# Patient Record
Sex: Male | Born: 1950 | Race: White | Hispanic: No | Marital: Married | Smoking: Current every day smoker
Health system: Southern US, Community
[De-identification: ages and names within clinical notes are randomized; demographics above are authoritative.]

## PROBLEM LIST (undated history)

## (undated) DIAGNOSIS — M1990 Unspecified osteoarthritis, unspecified site: Secondary | ICD-10-CM

## (undated) DIAGNOSIS — N433 Hydrocele, unspecified: Secondary | ICD-10-CM

## (undated) DIAGNOSIS — I10 Essential (primary) hypertension: Secondary | ICD-10-CM

## (undated) DIAGNOSIS — Z87820 Personal history of traumatic brain injury: Secondary | ICD-10-CM

## (undated) DIAGNOSIS — J449 Chronic obstructive pulmonary disease, unspecified: Secondary | ICD-10-CM

## (undated) HISTORY — PX: KNEE SURGERY: SHX244

---

## 1983-08-31 HISTORY — PX: FEMUR FRACTURE SURGERY: SHX633

## 2012-08-18 ENCOUNTER — Ambulatory Visit (INDEPENDENT_AMBULATORY_CARE_PROVIDER_SITE_OTHER): Payer: Commercial Managed Care - PPO | Admitting: Sports Medicine

## 2012-08-18 ENCOUNTER — Encounter: Payer: Self-pay | Admitting: Sports Medicine

## 2012-08-18 ENCOUNTER — Ambulatory Visit (HOSPITAL_BASED_OUTPATIENT_CLINIC_OR_DEPARTMENT_OTHER)
Admission: RE | Admit: 2012-08-18 | Discharge: 2012-08-18 | Disposition: A | Discharge status: Home / Self Care | Payer: Commercial Managed Care - PPO | Source: Ambulatory Visit | Attending: Sports Medicine | Admitting: Sports Medicine

## 2012-08-18 VITALS — BP 161/89 | HR 105 | Wt 258.0 lb

## 2012-08-18 DIAGNOSIS — N5089 Other specified disorders of the male genital organs: Secondary | ICD-10-CM

## 2012-08-18 DIAGNOSIS — R319 Hematuria, unspecified: Secondary | ICD-10-CM

## 2012-08-18 DIAGNOSIS — F17200 Nicotine dependence, unspecified, uncomplicated: Secondary | ICD-10-CM | POA: Insufficient documentation

## 2012-08-18 DIAGNOSIS — Z0000 Encounter for general adult medical examination without abnormal findings: Secondary | ICD-10-CM | POA: Insufficient documentation

## 2012-08-18 DIAGNOSIS — Z299 Encounter for prophylactic measures, unspecified: Secondary | ICD-10-CM

## 2012-08-18 DIAGNOSIS — N508 Other specified disorders of male genital organs: Secondary | ICD-10-CM

## 2012-08-18 DIAGNOSIS — Z23 Encounter for immunization: Secondary | ICD-10-CM

## 2012-08-18 DIAGNOSIS — I10 Essential (primary) hypertension: Secondary | ICD-10-CM

## 2012-08-18 DIAGNOSIS — N433 Hydrocele, unspecified: Secondary | ICD-10-CM | POA: Insufficient documentation

## 2012-08-18 LAB — POCT URINALYSIS DIPSTICK
Bilirubin, UA: NEGATIVE
Blood, UA: NEGATIVE
Glucose, UA: NEGATIVE
Ketones, UA: NEGATIVE
Leukocytes, UA: NEGATIVE
Nitrite, UA: NEGATIVE
Protein, UA: NEGATIVE
Spec Grav, UA: >=1.03
Urobilinogen, UA: 0.2
pH, UA: 5.5

## 2012-08-18 MED ORDER — LISINOPRIL-HYDROCHLOROTHIAZIDE 20-25 MG PO TABS: 0.5000 | ORAL_TABLET | Freq: Every day | ORAL | Status: DC

## 2012-08-18 MED ORDER — VARENICLINE TARTRATE 0.5 MG X 11 & 1 MG X 42 PO MISC: ORAL | Status: DC

## 2012-08-18 NOTE — Assessment & Plan Note (Signed)
Add lisinopril/hydrochlorothiazide, one half tab daily. Return in 2 weeks to recheck.
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
## 2012-08-18 NOTE — Assessment & Plan Note (Signed)
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

## 2012-08-18 NOTE — Assessment & Plan Note (Signed)
Needs flu and tetanus shots. We'll go ahead and ask gastroenterology referral for colonoscopy. Checking PSA.

## 2012-08-18 NOTE — Assessment & Plan Note (Addendum)
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 type symptoms. I will send him now for testicular ultrasound. I would also like a urology referral, as well as hCG, AFP, LDH.  Large hydrocele present on ultrasound, we will proceed with urology referral.

## 2012-08-22 LAB — COMPREHENSIVE METABOLIC PANEL
ALT: 24 U/L (ref 0–53)
AST: 17 U/L (ref 0–37)
Albumin: 4.4 g/dL (ref 3.5–5.2)
Alkaline Phosphatase: 67 U/L (ref 39–117)
BUN: 17 mg/dL (ref 6–23)
CO2: 28 mEq/L (ref 19–32)
Calcium: 10.2 mg/dL (ref 8.4–10.5)
Chloride: 97 mEq/L (ref 96–112)
Creat: 1.01 mg/dL (ref 0.50–1.35)
Glucose, Bld: 105 mg/dL — ABNORMAL HIGH (ref 70–99)
Potassium: 5.3 mEq/L (ref 3.5–5.3)
Sodium: 135 mEq/L (ref 135–145)
Total Bilirubin: 0.7 mg/dL (ref 0.3–1.2)
Total Protein: 7.4 g/dL (ref 6.0–8.3)

## 2012-08-22 LAB — PSA, TOTAL AND FREE
PSA, Free Pct: 34 % (ref >25)
PSA, Free: 0.21 ng/mL
PSA: 0.62 ng/mL (ref <=4.00)

## 2012-08-22 LAB — CBC
HCT: 50.8 % (ref 39.0–52.0)
Hemoglobin: 17.7 g/dL — ABNORMAL HIGH (ref 13.0–17.0)
MCH: 31.4 pg (ref 26.0–34.0)
MCHC: 34.8 g/dL (ref 30.0–36.0)
MCV: 90.2 fL (ref 78.0–100.0)
Platelets: 342 10*3/uL (ref 150–400)
RBC: 5.63 MIL/uL (ref 4.22–5.81)
RDW: 13.1 % (ref 11.5–15.5)
WBC: 10 10*3/uL (ref 4.0–10.5)

## 2012-08-22 LAB — HCG, QUANTITATIVE, PREGNANCY: hCG, Beta Chain, Quant, S: <2 m[IU]/mL

## 2012-08-22 LAB — TESTOSTERONE, FREE, TOTAL, SHBG
Sex Hormone Binding: 28 nmol/L (ref 13–71)
Testosterone, Free: 27.4 pg/mL — ABNORMAL LOW (ref 47.0–244.0)
Testosterone-% Free: 2 % (ref 1.6–2.9)
Testosterone: 134.59 ng/dL — ABNORMAL LOW (ref 300–890)

## 2012-08-22 LAB — LIPID PANEL
Cholesterol: 152 mg/dL (ref 0–200)
HDL: 34 mg/dL — ABNORMAL LOW (ref >39)
LDL Cholesterol: 59 mg/dL (ref 0–99)
Total CHOL/HDL Ratio: 4.5 Ratio
Triglycerides: 294 mg/dL — ABNORMAL HIGH (ref <150)
VLDL: 59 mg/dL — ABNORMAL HIGH (ref 0–40)

## 2012-08-22 LAB — TSH: TSH: 3.32 u[IU]/mL (ref 0.350–4.500)

## 2012-08-22 LAB — AFP TUMOR MARKER: AFP-Tumor Marker: 5.3 ng/mL (ref 0.0–8.0)

## 2012-08-22 LAB — LACTATE DEHYDROGENASE: LDH: 127 U/L (ref 94–250)

## 2012-08-24 MED ORDER — FENOFIBRATE 145 MG PO TABS: 145.0000 mg | ORAL_TABLET | Freq: Every day | ORAL | Status: DC

## 2012-08-24 NOTE — Addendum Note (Signed)
Addended by: Monica Becton on: 08/24/2012 09:45 AM   Modules accepted: Orders

## 2012-09-01 ENCOUNTER — Telehealth: Payer: Self-pay | Admitting: Sports Medicine

## 2012-09-01 DIAGNOSIS — N433 Hydrocele, unspecified: Secondary | ICD-10-CM

## 2012-09-01 NOTE — Telephone Encounter (Signed)
Patient walk-in advise that he needs a Urology referral set up. Thanks

## 2012-09-01 NOTE — Telephone Encounter (Signed)
Referral placed, thanks

## 2013-12-03 ENCOUNTER — Ambulatory Visit (INDEPENDENT_AMBULATORY_CARE_PROVIDER_SITE_OTHER): Payer: Commercial Managed Care - PPO | Admitting: Sports Medicine

## 2013-12-03 ENCOUNTER — Encounter: Payer: Self-pay | Admitting: Sports Medicine

## 2013-12-03 ENCOUNTER — Ambulatory Visit (INDEPENDENT_AMBULATORY_CARE_PROVIDER_SITE_OTHER): Payer: Commercial Managed Care - PPO

## 2013-12-03 VITALS — BP 200/173 | HR 77 | Ht 73.0 in | Wt 282.0 lb

## 2013-12-03 DIAGNOSIS — M1611 Unilateral primary osteoarthritis, right hip: Secondary | ICD-10-CM

## 2013-12-03 DIAGNOSIS — N5089 Other specified disorders of the male genital organs: Secondary | ICD-10-CM

## 2013-12-03 DIAGNOSIS — N508 Other specified disorders of male genital organs: Secondary | ICD-10-CM

## 2013-12-03 DIAGNOSIS — I10 Essential (primary) hypertension: Secondary | ICD-10-CM

## 2013-12-03 DIAGNOSIS — M1610 Unilateral primary osteoarthritis, unspecified hip: Secondary | ICD-10-CM

## 2013-12-03 DIAGNOSIS — Z96649 Presence of unspecified artificial hip joint: Secondary | ICD-10-CM | POA: Insufficient documentation

## 2013-12-03 DIAGNOSIS — M169 Osteoarthritis of hip, unspecified: Secondary | ICD-10-CM

## 2013-12-03 MED ORDER — MELOXICAM 15 MG PO TABS: ORAL_TABLET | ORAL | Status: DC

## 2013-12-03 NOTE — Assessment & Plan Note (Signed)
Noncompliant. Extremely elevated, no clinical signs of end organ damage. Is not taking blood pressure medication and had no followed up after previous visit for BP as directed.. Patient understands stroke and MI risk. I have emphasized the importance and would like to see him back in 2 weeks to augment blood pressure medication.

## 2013-12-03 NOTE — Assessment & Plan Note (Signed)
Unfortunately has not yet seen the urologist, referral was placed back in January of 2014. We again discussed the risks, he will make his own decision.

## 2013-12-03 NOTE — Assessment & Plan Note (Signed)
Guided right femoral acetabular joint injection as above. X-rays, formal PT, Mobic. Return to see me in 2 weeks
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
## 2013-12-27 ENCOUNTER — Other Ambulatory Visit: Payer: Self-pay | Admitting: *Deleted

## 2013-12-27 DIAGNOSIS — I10 Essential (primary) hypertension: Secondary | ICD-10-CM

## 2013-12-27 MED ORDER — LISINOPRIL-HYDROCHLOROTHIAZIDE 20-25 MG PO TABS: 0.5000 | ORAL_TABLET | Freq: Every day | ORAL | Status: DC

## 2014-01-31 ENCOUNTER — Ambulatory Visit (INDEPENDENT_AMBULATORY_CARE_PROVIDER_SITE_OTHER): Payer: Commercial Managed Care - PPO | Admitting: Sports Medicine

## 2014-01-31 ENCOUNTER — Encounter: Payer: Self-pay | Admitting: Sports Medicine

## 2014-01-31 VITALS — BP 117/71 | HR 100 | Ht 74.0 in | Wt 275.0 lb

## 2014-01-31 DIAGNOSIS — M169 Osteoarthritis of hip, unspecified: Secondary | ICD-10-CM

## 2014-01-31 DIAGNOSIS — N5089 Other specified disorders of the male genital organs: Secondary | ICD-10-CM

## 2014-01-31 DIAGNOSIS — Z0000 Encounter for general adult medical examination without abnormal findings: Secondary | ICD-10-CM

## 2014-01-31 DIAGNOSIS — M1611 Unilateral primary osteoarthritis, right hip: Secondary | ICD-10-CM

## 2014-01-31 DIAGNOSIS — F17200 Nicotine dependence, unspecified, uncomplicated: Secondary | ICD-10-CM

## 2014-01-31 DIAGNOSIS — Z299 Encounter for prophylactic measures, unspecified: Secondary | ICD-10-CM

## 2014-01-31 DIAGNOSIS — R195 Other fecal abnormalities: Secondary | ICD-10-CM

## 2014-01-31 DIAGNOSIS — N508 Other specified disorders of male genital organs: Secondary | ICD-10-CM

## 2014-01-31 DIAGNOSIS — I10 Essential (primary) hypertension: Secondary | ICD-10-CM

## 2014-01-31 DIAGNOSIS — M1610 Unilateral primary osteoarthritis, unspecified hip: Secondary | ICD-10-CM

## 2014-01-31 DIAGNOSIS — Z1211 Encounter for screening for malignant neoplasm of colon: Secondary | ICD-10-CM

## 2014-01-31 DIAGNOSIS — H6690 Otitis media, unspecified, unspecified ear: Secondary | ICD-10-CM | POA: Insufficient documentation

## 2014-01-31 LAB — HEMOCCULT GUIAC POC 1CARD (OFFICE): Fecal Occult Blood, POC: POSITIVE

## 2014-01-31 MED ORDER — TRAMADOL HCL 50 MG PO TABS: ORAL_TABLET | ORAL | Status: DC

## 2014-01-31 MED ORDER — LISINOPRIL-HYDROCHLOROTHIAZIDE 20-25 MG PO TABS: 0.5000 | ORAL_TABLET | Freq: Every day | ORAL | Status: DC

## 2014-01-31 MED ORDER — AMOXICILLIN-POT CLAVULANATE 875-125 MG PO TABS: 1.0000 | ORAL_TABLET | Freq: Two times a day (BID) | ORAL | Status: DC

## 2014-01-31 NOTE — Progress Notes (Signed)
  Subjective:    CC: Complete physical exam  HPI:  Hypertension: Well controlled now.  Testicular mass: Ultrasound showed hydrocele with internal echogenicity. We have recommended urology referral multiple times and he is continued to do for this, we have discussed the risks.  Right hip osteoarthritis: Persistent pain, injection lasted for 2 months, it was recommended that he pursue hip replacement however he would like to try experimental autologous stem implantation first.  Ear pain: Bilateral, pain in both ears, no change in hearing.  Preventive measure: Due for colonoscopy, we have also been recommending this since he started to see me, he has continued to do for her and cancel visits with gastroenterology.  Past medical history, Surgical history, Family history not pertinant except as noted below, Social history, Allergies, and medications have been entered into the medical record, reviewed, and no changes needed.   Review of Systems: No headache, visual changes, nausea, vomiting, diarrhea, constipation, dizziness, abdominal pain, skin rash, fevers, chills, night sweats, swollen lymph nodes, weight loss, chest pain, body aches, joint swelling, muscle aches, shortness of breath, mood changes, visual or auditory hallucinations.  Objective:    General: Well Developed, well nourished, and in no acute distress.  Neuro: Alert and oriented x3, extra-ocular muscles intact, sensation grossly intact.  HEENT: Normocephalic, atraumatic, pupils equal round reactive to light, neck supple, no masses, no lymphadenopathy, thyroid nonpalpable. Erythema of both tympanic membranes Skin: Warm and dry, no rashes noted.  Cardiac: Regular rate and rhythm, no murmurs rubs or gallops.  Respiratory: Clear to auscultation bilaterally. Not using accessory muscles, speaking in full sentences.  Abdominal: Soft, nontender, nondistended, positive bowel sounds, no masses, no organomegaly.  Musculoskeletal: Shoulder,  elbow, wrist, hip, knee, ankle stable, and with full range of motion. Rectal: Smooth prostate, good tone, Hemoccult positive.  Impression and Recommendations:    The patient was counselled, risk factors were discussed, anticipatory guidance given.

## 2014-01-31 NOTE — Assessment & Plan Note (Signed)
Again, referral colonoscopy, this time he has a Hemoccult positive stool. Physical exam performed.

## 2014-01-31 NOTE — Assessment & Plan Note (Signed)
Again, encouraged to see the urologist regarding testicular mass.

## 2014-01-31 NOTE — Assessment & Plan Note (Signed)
Again, counseled to quit.

## 2014-01-31 NOTE — Assessment & Plan Note (Signed)
Again, I have stressed the importance for over a year with screening colonoscopy. Now he has a positive Hemoccult, again, placing referral for colonoscopy.

## 2014-01-31 NOTE — Assessment & Plan Note (Signed)
Well-controlled, refilling blood pressure medication.

## 2014-01-31 NOTE — Assessment & Plan Note (Signed)
Augmentin

## 2014-01-31 NOTE — Assessment & Plan Note (Signed)
2 months response to intra-articular injection. We did discuss the likely need for placement. He would like to try stem cells first. He will start looking for a provider who can do autologous stem cell injection.

## 2014-07-18 ENCOUNTER — Encounter: Payer: Self-pay | Admitting: Sports Medicine

## 2014-07-18 ENCOUNTER — Ambulatory Visit (INDEPENDENT_AMBULATORY_CARE_PROVIDER_SITE_OTHER): Payer: Commercial Managed Care - PPO | Admitting: Sports Medicine

## 2014-07-18 VITALS — BP 146/85 | HR 80 | Ht 75.0 in | Wt 277.0 lb

## 2014-07-18 DIAGNOSIS — M1611 Unilateral primary osteoarthritis, right hip: Secondary | ICD-10-CM

## 2014-07-18 DIAGNOSIS — I10 Essential (primary) hypertension: Secondary | ICD-10-CM

## 2014-07-18 DIAGNOSIS — N5089 Other specified disorders of the male genital organs: Secondary | ICD-10-CM

## 2014-07-18 DIAGNOSIS — Z23 Encounter for immunization: Secondary | ICD-10-CM

## 2014-07-18 DIAGNOSIS — R195 Other fecal abnormalities: Secondary | ICD-10-CM

## 2014-07-18 MED ORDER — LISINOPRIL-HYDROCHLOROTHIAZIDE 20-25 MG PO TABS: 0.5000 | ORAL_TABLET | Freq: Every day | ORAL | Status: DC

## 2014-07-18 NOTE — Assessment & Plan Note (Signed)
Still has not yet seen urology, counseled on the risks of an untreated/undiagnosed testicular mass. He tells me he has not had time. Again counselled of the risks and possible diagnoses including cancer.

## 2014-07-18 NOTE — Assessment & Plan Note (Signed)
Refilling blood pressure medication. 

## 2014-07-18 NOTE — Assessment & Plan Note (Signed)
Right femoral acetabular steroid injection as above. If this is ineffective, patient is ready to try autologous stem cell implantation. Return in one month.

## 2014-07-18 NOTE — Assessment & Plan Note (Addendum)
Again, has not yet seen GI regarding fecal occult blood. Again counseled on the risks, benefits. Again counselled of the risks and possible diagnoses including cancer.

## 2014-07-18 NOTE — Progress Notes (Signed)
  Subjective:    CC: Follow-up  HPI: Right hip osteoarthritis: Desires injection, pain is in the groin, moderate, persistent without radiation. Last injection was 6-7 months ago.  Occult blood positive stool: Has not yet obtained his colonoscopy or seen a gastroenterologist as directed.  Testicular mass: Again, has not yet seen a urologist as directed.  Past medical history, Surgical history, Family history not pertinant except as noted below, Social history, Allergies, and medications have been entered into the medical record, reviewed, and no changes needed.   Review of Systems: No fevers, chills, night sweats, weight loss, chest pain, or shortness of breath.   Objective:    General: Well Developed, well nourished, and in no acute distress.  Neuro: Alert and oriented x3, extra-ocular muscles intact, sensation grossly intact.  HEENT: Normocephalic, atraumatic, pupils equal round reactive to light, neck supple, no masses, no lymphadenopathy, thyroid nonpalpable.  Skin: Warm and dry, no rashes. Cardiac: Regular rate and rhythm, no murmurs rubs or gallops, no lower extremity edema.  Respiratory: Clear to auscultation bilaterally. Not using accessory muscles, speaking in full sentences.  Procedure: Real-time Ultrasound Guided Injection of right femoral acetabular joint Device: GE Logiq E  Verbal informed consent obtained.  Time-out conducted.  Noted no overlying erythema, induration, or other signs of local infection.  Skin prepped in a sterile fashion.  Local anesthesia: Topical Ethyl chloride.  With sterile technique and under real time ultrasound guidance:  Spinal needle advanced in the femoral head/neck junction, 2 mL kenalog 40, 4 mL lidocaine injected easily. Completed without difficulty  Pain immediately resolved suggesting accurate placement of the medication.  Advised to call if fevers/chills, erythema, induration, drainage, or persistent bleeding.  Images permanently stored  and available for review in the ultrasound unit.  Impression: Technically successful ultrasound guided injection.  Impression and Recommendations:

## 2014-09-11 ENCOUNTER — Telehealth: Payer: Self-pay | Admitting: Sports Medicine

## 2014-09-11 MED ORDER — TRAMADOL HCL 50 MG PO TABS: ORAL_TABLET | ORAL | Status: DC

## 2014-09-11 NOTE — Telephone Encounter (Signed)
Rx has been faxed to [facility]. Ryosuke Ericksen,CMA  

## 2014-09-11 NOTE — Telephone Encounter (Signed)
Patient would like refill for tramadol sent to [facility] here in [place].

## 2014-09-11 NOTE — Telephone Encounter (Signed)
rx in box 

## 2014-10-30 ENCOUNTER — Telehealth: Payer: Self-pay

## 2014-10-30 NOTE — Telephone Encounter (Signed)
Patient request refill for Tramadol. Goble Fudala,CMA  

## 2014-10-31 MED ORDER — TRAMADOL HCL 50 MG PO TABS: ORAL_TABLET | ORAL | Status: DC

## 2014-10-31 NOTE — Telephone Encounter (Signed)
rx in box 

## 2014-10-31 NOTE — Telephone Encounter (Signed)
Rx Tramadol has been faxed to [facility]. Johna Kearl,CMA  

## 2014-11-01 ENCOUNTER — Other Ambulatory Visit: Payer: Self-pay | Admitting: Sports Medicine

## 2014-11-15 ENCOUNTER — Ambulatory Visit (INDEPENDENT_AMBULATORY_CARE_PROVIDER_SITE_OTHER): Payer: Commercial Managed Care - PPO | Admitting: Sports Medicine

## 2014-11-15 ENCOUNTER — Encounter: Payer: Self-pay | Admitting: Sports Medicine

## 2014-11-15 VITALS — BP 147/90 | HR 69 | Ht 74.0 in | Wt 267.0 lb

## 2014-11-15 DIAGNOSIS — I10 Essential (primary) hypertension: Secondary | ICD-10-CM

## 2014-11-15 DIAGNOSIS — M1611 Unilateral primary osteoarthritis, right hip: Secondary | ICD-10-CM | POA: Diagnosis not present

## 2014-11-15 MED ORDER — LISINOPRIL-HYDROCHLOROTHIAZIDE 20-25 MG PO TABS: 0.5000 | ORAL_TABLET | Freq: Every day | ORAL | Status: DC

## 2014-11-15 NOTE — Assessment & Plan Note (Signed)
Has not taken his blood pressure medicine.

## 2014-11-15 NOTE — Assessment & Plan Note (Signed)
Recent injection was 4 months ago. Repeat right femoral acetabular joint injection as above. Referral to orthopedic surgery for further discussion of total arthroplasty.

## 2014-11-15 NOTE — Progress Notes (Signed)
  Subjective:    CC: Hip pain  HPI: Christopher Robertson has known right femoral acetabular osteoarthritis, he does well with occasional hip joint injections under ultrasound guidance, the most recent injection was 4 months ago. Pain is moderate, persistent and localized in the groin. He has not yet seen orthopedic surgery for discussion of arthroplasty.  Past medical history, Surgical history, Family history not pertinant except as noted below, Social history, Allergies, and medications have been entered into the medical record, reviewed, and no changes needed.   Review of Systems: No fevers, chills, night sweats, weight loss, chest pain, or shortness of breath.   Objective:    General: Well Developed, well nourished, and in no acute distress.  Neuro: Alert and oriented x3, extra-ocular muscles intact, sensation grossly intact.  HEENT: Normocephalic, atraumatic, pupils equal round reactive to light, neck supple, no masses, no lymphadenopathy, thyroid nonpalpable.  Skin: Warm and dry, no rashes. Cardiac: Regular rate and rhythm, no murmurs rubs or gallops, no lower extremity edema.  Respiratory: Clear to auscultation bilaterally. Not using accessory muscles, speaking in full sentences.  Procedure: Real-time Ultrasound Guided Injection of right femoral acetabular joint Device: GE Logiq E  Verbal informed consent obtained.  Time-out conducted.  Noted no overlying erythema, induration, or other signs of local infection.  Skin prepped in a sterile fashion.  Local anesthesia: Topical Ethyl chloride.  With sterile technique and under real time ultrasound guidance:  Spinal needle advanced to the femoral head/neck junction, 2 mL kenalog 40, 4 mL lidocaine injected easily. Completed without difficulty  Pain immediately resolved suggesting accurate placement of the medication.  Advised to call if fevers/chills, erythema, induration, drainage, or persistent bleeding.  Images permanently stored and available  for review in the ultrasound unit.  Impression: Technically successful ultrasound guided injection.  Impression and Recommendations:

## 2014-12-06 ENCOUNTER — Telehealth: Payer: Self-pay | Admitting: Sports Medicine

## 2014-12-06 MED ORDER — TRAMADOL HCL 50 MG PO TABS: ORAL_TABLET | ORAL | Status: DC

## 2014-12-06 NOTE — Telephone Encounter (Signed)
Pt came by. He wants refill on Tramadol/[facility] [address].  Thank you.

## 2014-12-06 NOTE — Telephone Encounter (Signed)
Rx in box. 

## 2014-12-06 NOTE — Telephone Encounter (Signed)
Dr. T please see note below. Shauntelle Jamerson,CMA  

## 2014-12-09 NOTE — Telephone Encounter (Signed)
[facility] Donaven Criswell,CMA

## 2015-01-06 ENCOUNTER — Ambulatory Visit: Payer: Commercial Managed Care - PPO | Admitting: Sports Medicine

## 2015-01-16 ENCOUNTER — Ambulatory Visit (INDEPENDENT_AMBULATORY_CARE_PROVIDER_SITE_OTHER): Payer: Commercial Managed Care - PPO

## 2015-01-16 ENCOUNTER — Ambulatory Visit (INDEPENDENT_AMBULATORY_CARE_PROVIDER_SITE_OTHER): Payer: Commercial Managed Care - PPO | Admitting: Sports Medicine

## 2015-01-16 ENCOUNTER — Encounter: Payer: Self-pay | Admitting: Sports Medicine

## 2015-01-16 VITALS — BP 165/89 | HR 89 | Wt 261.0 lb

## 2015-01-16 DIAGNOSIS — R0602 Shortness of breath: Secondary | ICD-10-CM | POA: Diagnosis not present

## 2015-01-16 DIAGNOSIS — M1611 Unilateral primary osteoarthritis, right hip: Secondary | ICD-10-CM

## 2015-01-16 DIAGNOSIS — Z01818 Encounter for other preprocedural examination: Secondary | ICD-10-CM

## 2015-01-16 DIAGNOSIS — I10 Essential (primary) hypertension: Secondary | ICD-10-CM

## 2015-01-16 DIAGNOSIS — J449 Chronic obstructive pulmonary disease, unspecified: Secondary | ICD-10-CM | POA: Insufficient documentation

## 2015-01-16 MED ORDER — HYDROCODONE-ACETAMINOPHEN 5-325 MG PO TABS: 1.0000 | ORAL_TABLET | Freq: Two times a day (BID) | ORAL | Status: DC

## 2015-01-16 MED ORDER — AMLODIPINE BESYLATE 10 MG PO TABS: 10.0000 mg | ORAL_TABLET | Freq: Every day | ORAL | Status: DC

## 2015-01-16 NOTE — Progress Notes (Addendum)
  Subjective:    CC: Surgical clearance  HPI: Christopher Robertson returns, he is going to be set up for a hip replacement, he needs surgical clearance. He has smoked for decades, he does get occasional shortness of breath. He is able to walk up 2 flights of steps or a couple of blocks without any chest pain, diaphoresis, nausea, indicating at least 4 METs of exercise tolerance. His blood pressure is elevated today, no headaches, visual changes, chest pain.  Past medical history, Surgical history, Family history not pertinant except as noted below, Social history, Allergies, and medications have been entered into the medical record, reviewed, and no changes needed.   Review of Systems: No fevers, chills, night sweats, weight loss, chest pain, or shortness of breath.   Objective:    General: Well Developed, well nourished, and in no acute distress.  Neuro: Alert and oriented x3, extra-ocular muscles intact, sensation grossly intact.  HEENT: Normocephalic, atraumatic, pupils equal round reactive to light, neck supple, no masses, no lymphadenopathy, thyroid nonpalpable.  Skin: Warm and dry, no rashes. Cardiac: Regular rate and rhythm, no murmurs rubs or gallops, no lower extremity edema.  Respiratory: Clear to auscultation bilaterally. Not using accessory muscles, speaking in full sentences.  12-lead EKG shows normal sinus rhythm with normal axis, normal rate at 72, no PR, or ST changes.  Impression and Recommendations:    I spent 40 minutes with this patient, 50% was face-to-face time counseling regarding the above diagnoses

## 2015-01-16 NOTE — Assessment & Plan Note (Signed)
ECG, chest x-ray, appropriate cardiovascular endurance for intermediate risk noncardiac surgery. We do need to get his blood pressure under control and further evaluate his shortness of breath.

## 2015-01-16 NOTE — Assessment & Plan Note (Signed)
Long time smoker, patient will return for pre and post BD spirometry. CXR today.

## 2015-01-16 NOTE — Assessment & Plan Note (Addendum)
At this point we are going to start surgical clearance, [place]. Adding hydrocodone, some of the elevated blood pressure may be due to pain. #60 pills for 3 months.

## 2015-01-16 NOTE — Assessment & Plan Note (Signed)
1 full tab a lisinopril/hydrochlorothiazide and adding amlodipine. Return in one week on the nurse schedule to recheck blood pressure.

## 2015-01-17 ENCOUNTER — Ambulatory Visit: Payer: Commercial Managed Care - PPO | Admitting: Sports Medicine

## 2015-01-28 ENCOUNTER — Encounter: Payer: Self-pay | Admitting: Sports Medicine

## 2015-01-28 ENCOUNTER — Ambulatory Visit (INDEPENDENT_AMBULATORY_CARE_PROVIDER_SITE_OTHER): Payer: Commercial Managed Care - PPO | Admitting: Sports Medicine

## 2015-01-28 VITALS — BP 116/77 | HR 78 | Ht 74.0 in | Wt 262.0 lb

## 2015-01-28 DIAGNOSIS — M1611 Unilateral primary osteoarthritis, right hip: Secondary | ICD-10-CM

## 2015-01-28 DIAGNOSIS — Z01818 Encounter for other preprocedural examination: Secondary | ICD-10-CM | POA: Diagnosis not present

## 2015-01-28 DIAGNOSIS — I10 Essential (primary) hypertension: Secondary | ICD-10-CM | POA: Diagnosis not present

## 2015-01-28 MED ORDER — AMLODIPINE BESYLATE 10 MG PO TABS: 10.0000 mg | ORAL_TABLET | Freq: Every day | ORAL | Status: DC

## 2015-01-28 MED ORDER — LISINOPRIL-HYDROCHLOROTHIAZIDE 20-25 MG PO TABS: 1.0000 | ORAL_TABLET | Freq: Every day | ORAL | Status: DC

## 2015-01-28 MED ORDER — HYDROCODONE-ACETAMINOPHEN 5-325 MG PO TABS: 1.0000 | ORAL_TABLET | Freq: Two times a day (BID) | ORAL | Status: DC

## 2015-01-28 MED ORDER — TRAMADOL HCL 50 MG PO TABS: 50.0000 mg | ORAL_TABLET | Freq: Two times a day (BID) | ORAL | Status: DC

## 2015-01-28 NOTE — Assessment & Plan Note (Signed)
Well-controlled on lisinopril/hydrochlorothiazide and amlodipine. Refilling medications.

## 2015-01-28 NOTE — Assessment & Plan Note (Signed)
Refilling medication. Patient tells me it works well when used with tramadol. Ultimately his pain will not be controlled until he has a replacement.

## 2015-01-28 NOTE — Progress Notes (Signed)
  Subjective:    CC: follow-up  HPI: Right hip osteoarthritis: End-stage, awaiting total hip arthroplasty, tells me pain does well with hydrocodone mixed with tramadol, I am amenable to do this briefly.  Hypertension: Stabilized on lisinopril/hydrochlorothiazide as well as amlodipine at maximal doses.  Preop clearance: Blood pressure is now controlled with current medications, he has quit smoking, he is still due for a pre-and postbronchodilator spirometry test.  Past medical history, Surgical history, Family history not pertinant except as noted below, Social history, Allergies, and medications have been entered into the medical record, reviewed, and no changes needed.   Review of Systems: No fevers, chills, night sweats, weight loss, chest pain, or shortness of breath.   Objective:    General: Well Developed, well nourished, and in no acute distress.  Neuro: Alert and oriented x3, extra-ocular muscles intact, sensation grossly intact.  HEENT: Normocephalic, atraumatic, pupils equal round reactive to light, neck supple, no masses, no lymphadenopathy, thyroid nonpalpable.  Skin: Warm and dry, no rashes. Cardiac: Regular rate and rhythm, no murmurs rubs or gallops, no lower extremity edema.  Respiratory: Clear to auscultation bilaterally. Not using accessory muscles, speaking in full sentences.  Impression and Recommendations:

## 2015-01-28 NOTE — Assessment & Plan Note (Signed)
Blood pressure is now under control. Patient will return for pre-and postbronchodilator spirometry. Handicap placard written for use until surgery. Repeat work note.

## 2015-01-29 ENCOUNTER — Encounter: Payer: Self-pay | Admitting: Sports Medicine

## 2015-01-29 ENCOUNTER — Ambulatory Visit (INDEPENDENT_AMBULATORY_CARE_PROVIDER_SITE_OTHER): Payer: Commercial Managed Care - PPO | Admitting: Sports Medicine

## 2015-01-29 VITALS — BP 114/74 | HR 87 | Wt 261.0 lb

## 2015-01-29 DIAGNOSIS — J449 Chronic obstructive pulmonary disease, unspecified: Secondary | ICD-10-CM | POA: Diagnosis not present

## 2015-01-29 DIAGNOSIS — R0602 Shortness of breath: Secondary | ICD-10-CM

## 2015-01-29 MED ORDER — FLUTICASONE FUROATE-VILANTEROL 100-25 MCG/INH IN AEPB: 1.0000 | INHALATION_SPRAY | Freq: Every day | RESPIRATORY_TRACT | Status: DC

## 2015-01-29 MED ORDER — ALBUTEROL SULFATE (2.5 MG/3ML) 0.083% IN NEBU
2.5000 mg | INHALATION_SOLUTION | Freq: Once | RESPIRATORY_TRACT | Status: AC
Start: 2015-01-29 — End: 2015-01-29
  Administered 2015-01-29: 2.5 mg via RESPIRATORY_TRACT

## 2015-01-29 NOTE — Assessment & Plan Note (Signed)
Moderate to severe COPD. [facility] given to make [facility]

## 2015-01-29 NOTE — Addendum Note (Signed)
Addended by: Collie SiadICHARDSON, Robet Crutchfield M on: 01/29/2015 10:03 AM   Modules accepted: Orders

## 2015-01-29 NOTE — Progress Notes (Signed)
    Patient ID: Christopher Robertson, male   DOB: October 16, 1950, 64 y.o.   MRN: [id number] Patient came into clinic today for nurse visit, spirometry. Patient preformed pre spirometry, received a nebulizer albuterol treatment, then preformed post spirometry. Results were given to Dr. Benjamin Stainhekkekandam for review. Per Physician recommendation, Pt was given an Rx for an inhaler as well as a coupon card for 1 year free. No further questions.    Pre-and postbronchodilator spirometry, prolonged evaluation of bronchospasm was performed today. Results showed moderate to severe obstructive lung disease with no real improvement after bronchodilator.   I spent an additional amount of time discussing the results and the pathophysiology with the patient, a total of 40 minutes was spent with this patient and 50% was face-to-face time counseling regarding the above diagnoses.

## 2015-01-30 ENCOUNTER — Ambulatory Visit: Payer: Commercial Managed Care - PPO | Admitting: Sports Medicine

## 2015-02-03 ENCOUNTER — Encounter: Payer: Self-pay | Admitting: Sports Medicine

## 2015-02-03 ENCOUNTER — Ambulatory Visit (INDEPENDENT_AMBULATORY_CARE_PROVIDER_SITE_OTHER): Payer: Commercial Managed Care - PPO | Admitting: Sports Medicine

## 2015-02-03 VITALS — BP 136/78 | HR 87 | Ht 76.0 in | Wt 263.0 lb

## 2015-02-03 DIAGNOSIS — M1611 Unilateral primary osteoarthritis, right hip: Secondary | ICD-10-CM | POA: Diagnosis not present

## 2015-02-03 NOTE — Progress Notes (Signed)
  Subjective:    CC: FMLA paperwork.  HPI: Brayton MarsDarwin returns, he has hip osteoarthritis and is scheduled for an arthroplasty, needs short-term disability and FMLA paperwork filled out today.  Past medical history, Surgical history, Family history not pertinant except as noted below, Social history, Allergies, and medications have been entered into the medical record, reviewed, and no changes needed.   Review of Systems: No fevers, chills, night sweats, weight loss, chest pain, or shortness of breath.   Objective:    General: Well Developed, well nourished, and in no acute distress.  Neuro: Alert and oriented x3, extra-ocular muscles intact, sensation grossly intact.  HEENT: Normocephalic, atraumatic, pupils equal round reactive to light, neck supple, no masses, no lymphadenopathy, thyroid nonpalpable.  Skin: Warm and dry, no rashes. Cardiac: Regular rate and rhythm, no murmurs rubs or gallops, no lower extremity edema.  Respiratory: Clear to auscultation bilaterally. Not using accessory muscles, speaking in full sentences.  Impression and Recommendations:    I spent 25 minutes with this patient, 50% was face-to-face time counseling regarding the above diagnosis.

## 2015-02-03 NOTE — Assessment & Plan Note (Signed)
FMLA paperwork and short-term disability forms filled out today. I also printed him out copies of his medical records.

## 2015-02-24 ENCOUNTER — Other Ambulatory Visit: Payer: Self-pay | Admitting: *Deleted

## 2015-02-24 DIAGNOSIS — M1611 Unilateral primary osteoarthritis, right hip: Secondary | ICD-10-CM

## 2015-02-24 MED ORDER — TRAMADOL HCL 50 MG PO TABS: 50.0000 mg | ORAL_TABLET | Freq: Two times a day (BID) | ORAL | Status: DC

## 2015-02-28 HISTORY — PX: TOTAL HIP ARTHROPLASTY: SHX124

## 2015-03-10 ENCOUNTER — Encounter: Payer: Self-pay | Admitting: *Deleted

## 2015-03-10 ENCOUNTER — Emergency Department (INDEPENDENT_AMBULATORY_CARE_PROVIDER_SITE_OTHER)
Admission: EM | Admit: 2015-03-10 | Discharge: 2015-03-10 | Disposition: A | Discharge status: Home / Self Care | Payer: Commercial Managed Care - PPO | Source: Home / Self Care | Attending: Family Medicine | Admitting: Family Medicine

## 2015-03-10 DIAGNOSIS — G8918 Other acute postprocedural pain: Secondary | ICD-10-CM

## 2015-03-10 DIAGNOSIS — Z760 Encounter for issue of repeat prescription: Secondary | ICD-10-CM

## 2015-03-10 DIAGNOSIS — R600 Localized edema: Secondary | ICD-10-CM

## 2015-03-10 HISTORY — DX: Essential (primary) hypertension: I10

## 2015-03-10 MED ORDER — FUROSEMIDE 20 MG PO TABS: 20.0000 mg | ORAL_TABLET | Freq: Every day | ORAL | Status: DC

## 2015-03-10 MED ORDER — OXYCODONE-ACETAMINOPHEN 7.5-325 MG PO TABS: 1.0000 | ORAL_TABLET | Freq: Four times a day (QID) | ORAL | Status: DC | PRN

## 2015-03-10 MED ORDER — METHOCARBAMOL 500 MG PO TABS: 500.0000 mg | ORAL_TABLET | Freq: Two times a day (BID) | ORAL | Status: DC

## 2015-03-10 MED ORDER — GABAPENTIN 100 MG PO CAPS: 100.0000 mg | ORAL_CAPSULE | Freq: Three times a day (TID) | ORAL | Status: DC

## 2015-03-10 NOTE — ED Provider Notes (Signed)
CSN: [id number]     Arrival date & time 03/10/15  1533 History   First MD Initiated Contact with Patient 03/10/15 1610     Chief Complaint  Patient presents with  . Hip Pain  . Post-op Problem   (Consider location/radiation/quality/duration/timing/severity/associated sxs/prior Treatment) HPI  Patient is a 64 year old male presenting to urgent care with complaint of right hip pain 1 week postop Right hip replacement that was performed in [facility].  Pt was discharged last week, followed up with his surgeon on Thursday, July 7th. At that time, he had multiple plain films that ensured everything was in place and healing well. Pt came to [facility] requesting refill on his percocet as he was unable to f/u with PCP, Dr. Denyse Amass, until July 13th.  He describes Right left pain worse in the thigh than the hip. Pain is moderate to severe, aching, sore and occasional burning, fire sensation.  Pt states pain is worse with ambulation. He has tried flexeril as well but no relief. Pt notes significant edema to both legs since home health nurse provided PT on Friday and Saturday.  Denies CP or SOB. Denies hx of CHF. Pt states he was given compression stockings but is unsure how to put them on and was not sure if he needed to be using them. Denies fever, chills, n/v/d. Denies redness or warmth near surgical site.  Past Medical History  Diagnosis Date  . Hypertension    Past Surgical History  Procedure Laterality Date  . Knee surgery  1980 &2002  . Femur fracture surgery  1985  . Total hip arthroplasty Right    History reviewed. No pertinent family history. History  Substance Use Topics  . Smoking status: Current Every Day Smoker  . Smokeless tobacco: Not on file  . Alcohol Use: No    Review of Systems  Constitutional: Negative for fever and chills.  Respiratory: Negative for cough and shortness of breath.   Cardiovascular: Positive for leg swelling ( bilateral). Negative for chest  pain and palpitations.  Gastrointestinal: Negative for nausea, vomiting, abdominal pain and diarrhea.  Genitourinary: Negative for dysuria, urgency, hematuria and flank pain.  Musculoskeletal: Positive for myalgias, joint swelling and arthralgias.  Skin: Positive for wound. Negative for color change.  Neurological: Positive for numbness ( "electric shocks" in Right thigh). Negative for weakness.    Allergies  Review of patient's allergies indicates no known allergies.  Home Medications   Prior to Admission medications   Medication Sig Start Date End Date Taking? Authorizing Provider  amLODipine (NORVASC) 10 MG tablet Take 1 tablet (10 mg total) by mouth daily. 01/28/15   Monica Becton, MD  Fluticasone Furoate-Vilanterol (BREO ELLIPTA) 100-25 MCG/INH AEPB Inhale 1 puff into the lungs daily. 01/29/15   Monica Becton, MD  furosemide (LASIX) 20 MG tablet Take 1 tablet (20 mg total) by mouth daily. 03/10/15   Junius Finner, PA-C  gabapentin (NEURONTIN) 100 MG capsule Take 1 capsule (100 mg total) by mouth 3 (three) times daily. 03/10/15   Junius Finner, PA-C  lisinopril-hydrochlorothiazide (PRINZIDE,ZESTORETIC) 20-25 MG per tablet Take 1 tablet by mouth daily. 01/28/15   Monica Becton, MD  methocarbamol (ROBAXIN) 500 MG tablet Take 1 tablet (500 mg total) by mouth 2 (two) times daily. 03/10/15   Junius Finner, PA-C  oxyCODONE-acetaminophen (PERCOCET) 7.5-325 MG per tablet Take 1 tablet by mouth every 6 (six) hours as needed for severe pain. 03/10/15   Junius Finner, PA-C  traMADol Janean Sark) 50  MG tablet Take 1 tablet (50 mg total) by mouth 2 (two) times daily. 02/24/15   Monica Bectonhomas J Thekkekandam, MD   BP 105/68 mmHg  Pulse 107  Resp 16  SpO2 96% Physical Exam  Constitutional: He appears well-developed and well-nourished.  Pt sitting in wheelchair, NAD.  HENT:  Head: Normocephalic and atraumatic.  Eyes: Conjunctivae are normal. No scleral icterus.  Neck: Normal range of motion.  Neck supple.  Cardiovascular: Normal rate, regular rhythm and normal heart sounds.   Pulses:      Dorsalis pedis pulses are 2+ on the right side, and 2+ on the left side.  Tachycardic in triage, however, regular rate on exam.  Pulmonary/Chest: Effort normal and breath sounds normal. No respiratory distress. He has no wheezes. He has no rales. He exhibits no tenderness.  Lungs: CTAB, no rales or rhonchi  Abdominal: Soft. Bowel sounds are normal. He exhibits no distension and no mass. There is no tenderness. There is no rebound and no guarding.  Musculoskeletal: He exhibits edema and tenderness.  Right hip and thigh: bandage in place over Right hip, well healed surgical scar on distal lateral Right thigh. Mild tenderness to Right thigh. No calf tenderness. FROM Right knee w/o tenderness.  Bilateral 2 to 3+ pedal edema from toes to tibial tuberosities. Right > Left Muscle compartments are soft.  Neurological: He is alert.  Antalgic gait  Skin: Skin is warm and dry. No erythema.  Right hip: clean dry bandage in place over incision site. No surrounding erythema or tenderness.  Nursing note and vitals reviewed.   ED Course  Procedures (including critical care time) Labs Review Labs Reviewed  COMPLETE METABOLIC PANEL WITH GFR  CBC WITH DIFFERENTIAL/PLATELET    Imaging Review No results found.   MDM   1. Post-op pain   2. Bilateral leg edema   3. Medication refill     Patient is a 64 year old male presenting to urgent care for medication refill.  1 week postop right hip replacement.  Patient has a clean dry dressing over surgical site.  No surrounding erythema.  Patient is afebrile.  Doubt infection including cellulitis or septic joint. Patient does have 2-3+ pitting edema bilaterally. Compartments are soft.  No history of CHF.  Denies chest pain or difficulty breathing.  Lungs clear to auscultation bilaterally. Doubt PE or compartment syndrome.  Discussed patient with Dr. Denyse Amassorey, who  agrees.  We may start patient on low-dose Lasix and get baseline labs. Will refill patient's Percocet as well as prescribe robaxin and Neurontin to help with nerve type pain. Strongly encourage patient to attempt to use compression stockings at home, however, advise if he is unable to use them, please bring them to his next appointment on Wednesday. Encourage patient to keep legs elevated. Patient verbalized understanding and agreement with treatment plan.   Junius Finnerrin O'Malley, PA-C 03/10/15 [phone]

## 2015-03-10 NOTE — Discharge Instructions (Signed)
Be sure to take your medication as prescribed. Percocet is a narcotic pain medication, do not combine these medications with others containing tylenol. While taking, do not drink alcohol, drive, or perform any other activities that requires focus while taking these medications. Robaxin (muscle relaxer) may also cause drowsiness, do not drive or operate heavy machinery while taking.  If you still have your compression stockings at home, please try to wear them to help with edema. You may find it easier to try after keeping you legs elevated for 20-30 minutes.  Please bring to your follow up appointment with Dr. Denyse Amassorey if you have difficulty putting them on.  See below for further instructions.

## 2015-03-10 NOTE — ED Notes (Signed)
Pt had a total right hip [facility] >1 week ago. The surgery was done there due to insurance reasons. He was hoping to be able to f/u with Dr. Benjamin Stainhekkekandam today but is unable to see Dr. Denyse Amassorey until Wednesday and Dr. Karie Schwalbe the following week. He is out of pain medication and has lower extremity pitting edema.

## 2015-03-11 LAB — COMPLETE METABOLIC PANEL WITH GFR
ALT: 16 U/L (ref 0–53)
AST: 15 U/L (ref 0–37)
Albumin: 3.4 g/dL — ABNORMAL LOW (ref 3.5–5.2)
Alkaline Phosphatase: 63 U/L (ref 39–117)
BUN: 29 mg/dL — ABNORMAL HIGH (ref 6–23)
CO2: 26 mEq/L (ref 19–32)
Calcium: 9.5 mg/dL (ref 8.4–10.5)
Chloride: 96 mEq/L (ref 96–112)
Creat: 1.17 mg/dL (ref 0.50–1.35)
GFR, Est African American: 77 mL/min
GFR, Est Non African American: 67 mL/min
Glucose, Bld: 120 mg/dL — ABNORMAL HIGH (ref 70–99)
Potassium: 4.5 mEq/L (ref 3.5–5.3)
Sodium: 133 mEq/L — ABNORMAL LOW (ref 135–145)
Total Bilirubin: 0.4 mg/dL (ref 0.2–1.2)
Total Protein: 6.3 g/dL (ref 6.0–8.3)

## 2015-03-11 LAB — CBC WITH DIFFERENTIAL/PLATELET
Basophils Absolute: 0.1 10*3/uL (ref 0.0–0.1)
Basophils Relative: 1 % (ref 0–1)
Eosinophils Absolute: 0.6 10*3/uL (ref 0.0–0.7)
Eosinophils Relative: 6 % — ABNORMAL HIGH (ref 0–5)
HCT: 32.5 % — ABNORMAL LOW (ref 39.0–52.0)
Hemoglobin: 10.9 g/dL — ABNORMAL LOW (ref 13.0–17.0)
Lymphocytes Relative: 17 % (ref 12–46)
Lymphs Abs: 1.7 10*3/uL (ref 0.7–4.0)
MCH: 31 pg (ref 26.0–34.0)
MCHC: 33.5 g/dL (ref 30.0–36.0)
MCV: 92.3 fL (ref 78.0–100.0)
MPV: 8.7 fL (ref 8.6–12.4)
Monocytes Absolute: 0.9 10*3/uL (ref 0.1–1.0)
Monocytes Relative: 9 % (ref 3–12)
Neutro Abs: 6.8 10*3/uL (ref 1.7–7.7)
Neutrophils Relative %: 67 % (ref 43–77)
Platelets: 507 10*3/uL — ABNORMAL HIGH (ref 150–400)
RBC: 3.52 MIL/uL — ABNORMAL LOW (ref 4.22–5.81)
RDW: 13.1 % (ref 11.5–15.5)
WBC: 10.2 10*3/uL (ref 4.0–10.5)

## 2015-03-12 ENCOUNTER — Telehealth: Payer: Self-pay

## 2015-03-12 ENCOUNTER — Ambulatory Visit (INDEPENDENT_AMBULATORY_CARE_PROVIDER_SITE_OTHER): Payer: Commercial Managed Care - PPO | Admitting: Family Medicine

## 2015-03-12 ENCOUNTER — Encounter: Payer: Self-pay | Admitting: Family Medicine

## 2015-03-12 VITALS — BP 131/72 | HR 96 | Wt 274.0 lb

## 2015-03-12 DIAGNOSIS — M25551 Pain in right hip: Secondary | ICD-10-CM | POA: Diagnosis not present

## 2015-03-12 DIAGNOSIS — M25559 Pain in unspecified hip: Secondary | ICD-10-CM | POA: Insufficient documentation

## 2015-03-12 DIAGNOSIS — Z96649 Presence of unspecified artificial hip joint: Secondary | ICD-10-CM | POA: Insufficient documentation

## 2015-03-12 DIAGNOSIS — Z9660 Presence of unspecified orthopedic joint implant: Secondary | ICD-10-CM

## 2015-03-12 DIAGNOSIS — M7989 Other specified soft tissue disorders: Secondary | ICD-10-CM | POA: Diagnosis not present

## 2015-03-12 MED ORDER — OXYCODONE-ACETAMINOPHEN 7.5-325 MG PO TABS: 1.0000 | ORAL_TABLET | Freq: Four times a day (QID) | ORAL | Status: DC | PRN

## 2015-03-12 NOTE — Telephone Encounter (Addendum)
Christopher Robertson from [employer] called stated that she need a letter stating what day patient had surgery and why was he taken out of work and what are his restrictions.  She also needs the letter to state when his return day to work since it was not documented and patient does not have an upcoming appt.Christopher Robertson. Christopher Robertson,CMA

## 2015-03-12 NOTE — Patient Instructions (Signed)
Thank you for coming in today. Attend physical therapy Follow-up with Dr. Karie Schwalbe on July 26 Take oxycodone as needed. Max 5 pills per day Wean your oxycodone use Call or go to the emergency room if you get worse, have trouble breathing, have chest pains, or palpitations.

## 2015-03-12 NOTE — Assessment & Plan Note (Signed)
Doing well. Start physical therapy. Refill oxycodone to 5 tablets per day. Follow-up with PCP in 2 weeks to continue postoperative care.

## 2015-03-12 NOTE — Progress Notes (Signed)
Christopher Robertson is a 64 y.o. male who presents to [facility]  today for hip pain, leg swelling, and follow-up right total hip replacement. [place] 12 days ago.  He is here today for postoperative follow-up staple removal and referral to physical therapy. He notes his pain is significant. He takes taking approximately six 7.5 oxycodone as per day. He also notes leg swelling. He was seen in urgent care 2 days ago for medicine refill. At that time he was found to have leg swelling thought to be due to dependent edema. Swelling was symmetrical bilaterally. He was started on Lasix and a metabolic panel was obtained.  Overall he feels well. He is able to ambulate with a walker. He is urinating normally. No chest pains palpitations shortness of breath or orthopnea.   Past Medical History  Diagnosis Date  . Hypertension    Past Surgical History  Procedure Laterality Date  . Knee surgery  1980 &2002  . Femur fracture surgery  1985  . Total hip arthroplasty Right    History  Substance Use Topics  . Smoking status: Current Every Day Smoker  . Smokeless tobacco: Not on file  . Alcohol Use: No   ROS as above Medications: Current Outpatient Prescriptions  Medication Sig Dispense Refill  . Fluticasone Furoate-Vilanterol (BREO ELLIPTA) 100-25 MCG/INH AEPB Inhale 1 puff into the lungs daily. 1 each 11  . furosemide (LASIX) 20 MG tablet Take 1 tablet (20 mg total) by mouth daily. 30 tablet 0  . gabapentin (NEURONTIN) 100 MG capsule Take 1 capsule (100 mg total) by mouth 3 (three) times daily. 60 capsule 0  . methocarbamol (ROBAXIN) 500 MG tablet Take 1 tablet (500 mg total) by mouth 2 (two) times daily. 20 tablet 0  . oxyCODONE-acetaminophen (PERCOCET) 7.5-325 MG per tablet Take 1 tablet by mouth every 6 (six) hours as needed for severe pain. 70 tablet 0  . amLODipine (NORVASC) 10 MG tablet Take 1 tablet (10 mg total)  by mouth daily. (Patient not taking: Reported on 03/12/2015) 90 tablet 3  . lisinopril-hydrochlorothiazide (PRINZIDE,ZESTORETIC) 20-25 MG per tablet Take 1 tablet by mouth daily. (Patient not taking: Reported on 03/12/2015) 90 tablet 3   No current facility-administered medications for this visit.   No Known Allergies   Exam:  BP 131/72 mmHg  Pulse 96  Wt 274 lb (124.286 kg) Gen: Well NAD HEENT: EOMI,  MMM Lungs: Normal work of breathing. CTABL Heart: RRR no MRG Abd: NABS, Soft. Nondistended, Nontender Exts: Brisk capillary refill, warm and well perfused. 1+ edema bilaterally. Calf diameters are equal. Right hip well-appearing incision on the anterior aspect hip with multiple staples in place. No wound erythema. Capillary refill and sensation are intact distally.   No results found for this or any previous visit (from the past 24 hour(s)). No results found.   Please see individual assessment and plan sections. This visit required moderate complexity and decision making.

## 2015-03-13 ENCOUNTER — Encounter: Payer: Self-pay | Admitting: Sports Medicine

## 2015-03-13 LAB — BASIC METABOLIC PANEL
BUN: 16 mg/dL (ref 6–23)
CO2: 28 mEq/L (ref 19–32)
Calcium: 9.5 mg/dL (ref 8.4–10.5)
Chloride: 100 mEq/L (ref 96–112)
Creat: 1 mg/dL (ref 0.50–1.35)
Glucose, Bld: 101 mg/dL — ABNORMAL HIGH (ref 70–99)
Potassium: 4.8 mEq/L (ref 3.5–5.3)
Sodium: 138 mEq/L (ref 135–145)

## 2015-03-13 NOTE — Telephone Encounter (Signed)
Letter in box. 

## 2015-03-13 NOTE — Telephone Encounter (Signed)
LETTER HAS BEEN FAXED. Dany Harten,CMA

## 2015-03-21 ENCOUNTER — Ambulatory Visit (INDEPENDENT_AMBULATORY_CARE_PROVIDER_SITE_OTHER): Payer: Commercial Managed Care - PPO | Admitting: Physical Therapy

## 2015-03-21 ENCOUNTER — Encounter: Payer: Self-pay | Admitting: Physical Therapy

## 2015-03-21 DIAGNOSIS — R531 Weakness: Secondary | ICD-10-CM

## 2015-03-21 DIAGNOSIS — M25651 Stiffness of right hip, not elsewhere classified: Secondary | ICD-10-CM | POA: Diagnosis not present

## 2015-03-21 DIAGNOSIS — R609 Edema, unspecified: Secondary | ICD-10-CM | POA: Diagnosis not present

## 2015-03-21 DIAGNOSIS — R269 Unspecified abnormalities of gait and mobility: Secondary | ICD-10-CM

## 2015-03-21 NOTE — Patient Instructions (Signed)
Standing weight shift:  Side to side Forward and backwards with right leg in front and then right leg behind.  Start with 10 reps, build up to 3x10  Once a day.   EXTENSION: Standing (Active)   Stand, both feet flat. Draw right leg behind body as far as possible. Use _0__ lbs. Complete _10__ sets of _2-3__ repetitions. Perform _1__ sessions per day.  ABDUCTION: Standing (Active)   Stand, feet flat. Lift right leg out to side. Use _0__ lbs. Complete _10__ sets of _2-3__ repetitions. Perform _1__ sessions per day.  [place] [phone]  Use heat on right lower thigh Use ice on the right hip/groin

## 2015-03-21 NOTE — Therapy (Signed)
[facility] Outpatient Rehabilitation Time [address] [place], [place], [address] Phone: [phone]   Fax:  [phone]  Physical Therapy Evaluation  Patient Details  Name: Christopher Robertson MRN: [id number] Date of Birth: 01-12-1951 Referring Provider:  Monica Becton,*  Encounter Date: 03/21/2015      PT End of Session - 03/21/15 1555    Visit Number 1   Number of Visits 20   Date for PT Re-Evaluation 05/16/15   PT Start Time 1405   PT Stop Time 1507   PT Time Calculation (min) 62 min   Activity Tolerance Patient tolerated treatment well      Past Medical History  Diagnosis Date  . Hypertension     Past Surgical History  Procedure Laterality Date  . Knee surgery  1980 &2002  . Femur fracture surgery  1985  . Total hip arthroplasty Right     There were no vitals filed for this visit.  Visit Diagnosis:  Weakness generalized - Plan: PT plan of care cert/re-cert  Stiffness of hip joint, right - Plan: PT plan of care cert/re-cert  Abnormality of gait - Plan: PT plan of care cert/re-cert  Edema - Plan: PT plan of care cert/re-cert      Subjective Assessment - 03/21/15 1408    Subjective Pt had elective Rt THA on 02/28/15, had HHPT until 03/08/15 in [place], Anterior approach. Patient waited until the pain was so bad to have the replacement.    Pertinent History shattered Rt femur also had femoral head hairlinefx early 1980's . broke the femoral plate and had rods/plate removed in the late 80's.  About a yr ago developed a lot of hip pain.  Currenlty preforming sitting ther ex and lying leg lifts.    Diagnostic tests Pt's insurance company has a policy  that contracts to have total joints performed in [place].    Patient Stated Goals walk without an aide, play baseball with kids. perform lower body dressing   Currently in Pain? No/denies  only has pain with weightbearing on the Rt LE up to 7/10 shooting and sharp pain.    Aggravating  Factors  weight bearing, being still and then having to move. ie after sleeping.             [facility] PT Assessment - 03/21/15 0001    Assessment   Medical Diagnosis Rt THA   Onset Date/Surgical Date 02/28/15   Next MD Visit 03/26/15   Prior Therapy HHPT in [place]   Precautions   Precautions Anterior Hip   Restrictions   Weight Bearing Restrictions Yes   Other Position/Activity Restrictions WBAT   Balance Screen   Has the patient fallen in the past 6 months No   Has the patient had a decrease in activity level because of a fear of falling?  Yes  from recent surgery   Is the patient reluctant to leave their home because of a fear of falling?  No   Home Environment   Living Environment Private residence   Living Arrangements Spouse/significant other   Home Access Stairs to enter  12   Home Layout Two level   Prior Function   Level of Independence Independent   Vocation Full time employment   Vocation Requirements truck driver, furniture    Leisure walk, play with grandkids, play baseball   Observation/Other Assessments   Focus on Therapeutic Outcomes (FOTO)  73% limited   Posture/Postural Control   Posture Comments healing incision with decreased mobiltiy  ROM / Strength   AROM / PROM / Strength AROM;Strength   AROM   AROM Assessment Site Hip;Knee   Right/Left Hip --  Rt hip A/PROM abduction 14/16   Right/Left Knee --  flex Rt 121, Lt 130    Strength   Overall Strength Deficits   Strength Assessment Site Knee;Hip   Right/Left Hip Right  Lt WNL   Right Hip Flexion 2+/5   Right Hip Extension 4-/5   Right Hip ABduction 3-/5   Right Hip ADduction 3+/5   Right/Left Knee Right   Right Knee Flexion --  5-/5   Right Knee Extension 4+/5   Flexibility   Soft Tissue Assessment /Muscle Length --  tight Rt quad with prone stretch & in groin   Palpation   Palpation comment tightness in incision and into Rt quad and groin   Ambulation/Gait   Ambulation/Gait Yes    Ambulation/Gait Assistance 6: Modified independent (Device/Increase time)   Assistive device Rolling walker   Gait Pattern Step-to pattern;Right flexed knee in stance;Antalgic   Ambulation Surface Level                   [facility] Adult PT Treatment/Exercise - 03/21/15 0001    Exercises   Exercises Knee/Hip   Knee/Hip Exercises: Standing   Hip ADduction Right;Strengthening;10 reps  at counter   Hip Extension Stengthening;Right;10 reps  at counter with VC for form   Other Standing Knee Exercises weight shifting side/side, FWD and Back.    Modalities   Modalities Electrical Stimulation;Cryotherapy;Moist Heat   Moist Heat Therapy   Number Minutes Moist Heat 15 Minutes   Moist Heat Location --  Rt lower quad   Cryotherapy   Number Minutes Cryotherapy 15 Minutes   Cryotherapy Location --  Rt hip/groin   Type of Cryotherapy Ice pack   Electrical Stimulation   Electrical Stimulation Location Rt hip   Electrical Stimulation Action IFC   Electrical Stimulation Parameters to tolerance   Electrical Stimulation Goals Pain;Edema   Manual Therapy   Manual therapy comments cross friction massage incision                 PT Education - 03/21/15 1555    Education provided Yes   Education Details HEP and ;massage to incision, heat to Rt distal quad   Person(s) Educated Patient   Methods Explanation;Demonstration;Handout   Comprehension Returned demonstration          PT Short Term Goals - 03/21/15 1601    PT SHORT TERM GOAL #1   Title I with initial HEP ( 04/18/15)   Time 4   Period Weeks   Status New   PT SHORT TERM GOAL #2   Title increase strength Rt hip =/> 4/5 ( 04/18/15)   Time 4   Period Weeks   Status New   PT SHORT TERM GOAL #3   Title ambulate with straight cane on even surfaces ( 04/18/15)   Time 4   Period Weeks   Status New   PT SHORT TERM GOAL #4   Title improve FOTO =/< 60% limited ( 04/18/15)   Time 4   Period Weeks   Status New   PT SHORT  TERM GOAL #5   Title report pain decrease to allow him to only need OTC medication ( 04/18/15)    Time 4   Period Weeks   Status New           PT Long Term Goals - 03/21/15 1610  PT LONG TERM GOAL #1   Title I with advanced HEP ( 05/16/15)    Time 8   Period Weeks   Status New   PT LONG TERM GOAL #2   Title demo Rt LE strength =/> 5-/5 ( 05/16/15)    Time 8   Period Weeks   Status New   PT LONG TERM GOAL #3   Title ambulate without an assistive device on even and uneven surfaces ( 05/16/15)    Time 8   Period Weeks   Status New   PT LONG TERM GOAL #4   Title improve FOTO =/< 54% limited ( 05/16/15)    Time 8   Period Weeks   Status New   PT LONG TERM GOAL #5   Title perform lifitng activites to simulate work per MD restriction for lifting ( 05/16/15)    Time 8   Period Weeks   Status New   Additional Long Term Goals   Additional Long Term Goals Yes   PT LONG TERM GOAL #6   Title climb on /off back of trailer without difficulty to simulate work ( 05/16/15)    Time 8   Period Weeks   Status New               Plan - 03/21/15 1556    Clinical Impression Statement 64 yo male presents s/p Rt THA 3 weeks ago, walking with rolling walker, decreased weightbearing on Rt LE.  He is very weak and tight in the Rt hip, with muscle spasms in the upper leg. He has a very physical job and will take a lot to get back to that. Pt also reports mulitple past fx in the Rt femur that may affect progress.    Pt will benefit from skilled therapeutic intervention in order to improve on the following deficits Abnormal gait;Decreased range of motion;Increased muscle spasms;Pain;Decreased balance;Decreased mobility;Decreased strength;Increased edema   Rehab Potential Good   Clinical Impairments Affecting Rehab Potential previous injuries to the Rt femur   PT Frequency 3x / week   PT Duration 4 weeks  then 2x/wk for 4 weeks   PT Treatment/Interventions Moist Heat;Ultrasound;Therapeutic  exercise;Taping;Vasopneumatic Device;Manual techniques;Balance training;DME Instruction;Neuromuscular re-education;Gait training;Cryotherapy;Electrical Stimulation;Stair training;Patient/family education;Scar mobilization   PT Next Visit Plan progress weightbearing, ambulation wean off walker. strengthening   Consulted and Agree with Plan of Care Patient         Problem List Patient Active Problem List   Diagnosis Date Noted  . Hip joint replacement status 03/12/2015  . Hip pain 03/12/2015  . Leg swelling 03/12/2015  . 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 Pre-operative clearance 01/16/2015  . Occult blood positive stool 01/31/2014  . Otitis media 01/31/2014  . Osteoarthritis of right hip 12/03/2013  . Testicular mass 08/18/2012  . Hypertension 08/18/2012  . Smoker 08/18/2012  . Preventive measure 08/18/2012    Roderic Scarce PT  03/21/2015, 4:17 PM  [facility] [address] [place], [place], [address] Phone: [phone]   Fax:  [phone]

## 2015-03-24 ENCOUNTER — Ambulatory Visit (INDEPENDENT_AMBULATORY_CARE_PROVIDER_SITE_OTHER): Payer: Commercial Managed Care - PPO | Admitting: Physical Therapy

## 2015-03-24 DIAGNOSIS — M25651 Stiffness of right hip, not elsewhere classified: Secondary | ICD-10-CM

## 2015-03-24 DIAGNOSIS — R269 Unspecified abnormalities of gait and mobility: Secondary | ICD-10-CM | POA: Diagnosis not present

## 2015-03-24 DIAGNOSIS — R531 Weakness: Secondary | ICD-10-CM

## 2015-03-24 NOTE — Therapy (Signed)
[facility] [place] [address] [place], [place], [address] Phone: [phone]   Fax:  [phone]  Physical Therapy Treatment  Patient Details  Name: Christopher Robertson MRN: [id number] Date of Birth: [birth year] Referring Provider:  Monica Becton,*  Encounter Date: 03/24/2015      PT End of Session - 03/24/15 1655    Visit Number 2   Number of Visits 20   Date for PT Re-Evaluation 05/16/15   PT Start Time 1605   PT Stop Time 1659   PT Time Calculation (min) 54 min      Past Medical History  Diagnosis Date  . Hypertension     Past Surgical History  Procedure Laterality Date  . Knee surgery  1980 &2002  . Femur fracture surgery  1985  . Total hip arthroplasty Right     There were no vitals filed for this visit.  Visit Diagnosis:  Weakness generalized  Stiffness of hip joint, right  Abnormality of gait      Subjective Assessment - 03/24/15 1605    Subjective Pt reports he had difficulty walking with heel-toe; had to walk on toe. Painful weightbearing into RLE (sharp), in bed RLE pain in "achy".  HEP exercises difficult and painful.    Currently in Pain? Yes   Pain Score 5    Pain Location Groin   Pain Orientation Right   Pain Descriptors / Indicators Shooting;Pounding   Aggravating Factors  weight bearing    Pain Relieving Factors medicine, ice/ heat             [facility] PT Assessment - 03/24/15 0001    Assessment   Medical Diagnosis Rt THA   Onset Date/Surgical Date 02/28/15   Next MD Visit 03/26/15   Prior Therapy [facility] in [place]                     [facility] Adult PT Treatment/Exercise - 03/24/15 0001    Ambulation/Gait   Ambulation/Gait Yes   Ambulation/Gait Assistance 6: Modified independent (Device/Increase time)   Ambulation Distance (Feet) 160 Feet   Assistive device Rolling walker   Gait Pattern Step-through pattern;Decreased stance time - right   Ambulation Surface  Level;Indoor   Gait Comments VC for increased heel strike Rt, decreased step length Lt, upright posture. Gait quality improved after raising height of RW and delivery of VC   Knee/Hip Exercises: Stretches   Theme park manager Right;2 reps;20 seconds   Other Knee/Hip Stretches supine adductor stretch x 1 min (frog position)   Knee/Hip Exercises: Aerobic   Nustep L3: 5 min    Knee/Hip Exercises: Standing   Heel Raises Both;1 set;10 reps  with toe raises    Hip Abduction Right;5 reps;1 set   Abduction Limitations substituted with QL.    Other Standing Knee Exercises weight shifting side/side, FWD and Back.    Knee/Hip Exercises: Seated   Other Seated Knee/Hip Exercises seated marching x 20    Sit to Sand 1 set;10 reps  cues to lean Rt, Lt hand on RW   Modalities   Modalities Electrical Stimulation;Cryotherapy   Cryotherapy   Number Minutes Cryotherapy 15 Minutes   Cryotherapy Location --  Rt hip/groin   Type of Cryotherapy Ice pack   Electrical Stimulation   Electrical Stimulation Location Rt hip   Electrical Stimulation Action IFC   Electrical Stimulation Parameters to tolerance x 15 min    Electrical Stimulation Goals Pain  PT Short Term Goals - 03/21/15 1601    PT SHORT TERM GOAL #1   Title I with initial HEP ( 04/18/15)   Time 4   Period Weeks   Status New   PT SHORT TERM GOAL #2   Title increase strength Rt hip =/> 4/5 ( 04/18/15)   Time 4   Period Weeks   Status New   PT SHORT TERM GOAL #3   Title ambulate with straight cane on even surfaces ( 04/18/15)   Time 4   Period Weeks   Status New   PT SHORT TERM GOAL #4   Title improve FOTO =/< 60% limited ( 04/18/15)   Time 4   Period Weeks   Status New   PT SHORT TERM GOAL #5   Title report pain decrease to allow him to only need OTC medication ( 04/18/15)    Time 4   Period Weeks   Status New           PT Long Term Goals - 03/21/15 1603    PT LONG TERM GOAL #1   Title I with advanced  HEP ( 05/16/15)    Time 8   Period Weeks   Status New   PT LONG TERM GOAL #2   Title demo Rt LE strength =/> 5-/5 ( 05/16/15)    Time 8   Period Weeks   Status New   PT LONG TERM GOAL #3   Title ambulate without an assistive device on even and uneven surfaces ( 05/16/15)    Time 8   Period Weeks   Status New   PT LONG TERM GOAL #4   Title improve FOTO =/< 54% limited ( 05/16/15)    Time 8   Period Weeks   Status New   PT LONG TERM GOAL #5   Title perform lifitng activites to simulate work per MD restriction for lifting ( 05/16/15)    Time 8   Period Weeks   Status New   Additional Long Term Goals   Additional Long Term Goals Yes   PT LONG TERM GOAL #6   Title climb on /off back of trailer without difficulty to simulate work ( 05/16/15)    Time 8   Period Weeks   Status New               Plan - 03/24/15 1648    Clinical Impression Statement Pt presents with antalgic gait and forward flexed posture with RW.  Pt with difficulty with weight bearing exercises. Pt reported decreased thigh and groin pain during ther ex and further reduction with use of ice/estim.  Height of RW adjusted, pt demo improved posture with gait.    Pt will benefit from skilled therapeutic intervention in order to improve on the following deficits Abnormal gait;Decreased range of motion;Increased muscle spasms;Pain;Decreased balance;Decreased mobility;Decreased strength;Increased edema   Rehab Potential Good   Clinical Impairments Affecting Rehab Potential previous injuries to the Rt femur   PT Frequency 3x / week   PT Duration 4 weeks  then 2 x/wk for 4 wk   PT Treatment/Interventions Moist Heat;Ultrasound;Therapeutic exercise;Taping;Vasopneumatic Device;Manual techniques;Balance training;DME Instruction;Neuromuscular re-education;Gait training;Cryotherapy;Electrical Stimulation;Stair training;Patient/family education;Scar mobilization   PT Next Visit Plan progress weightbearing, ambulation wean off  walker. strengthening   Consulted and Agree with Plan of Care Patient        Problem List Patient Active Problem List   Diagnosis Date Noted  . Hip joint replacement status 03/12/2015  . Hip pain 03/12/2015  .  Leg swelling 03/12/2015  . 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 Pre-operative clearance 01/16/2015  . Occult blood positive stool 01/31/2014  . Otitis media 01/31/2014  . Osteoarthritis of right hip 12/03/2013  . Testicular mass 08/18/2012  . Hypertension 08/18/2012  . Smoker 08/18/2012  . Preventive measure 08/18/2012    Mayer Camel, PTA 03/24/2015 4:57 PM  [facility] [place] [address] [place], [place], [address] Phone: [phone]   Fax:  [phone]

## 2015-03-25 ENCOUNTER — Ambulatory Visit (INDEPENDENT_AMBULATORY_CARE_PROVIDER_SITE_OTHER): Payer: Commercial Managed Care - PPO | Admitting: Sports Medicine

## 2015-03-25 ENCOUNTER — Ambulatory Visit (INDEPENDENT_AMBULATORY_CARE_PROVIDER_SITE_OTHER): Payer: Commercial Managed Care - PPO

## 2015-03-25 ENCOUNTER — Encounter: Payer: Self-pay | Admitting: Sports Medicine

## 2015-03-25 DIAGNOSIS — M25551 Pain in right hip: Secondary | ICD-10-CM

## 2015-03-25 DIAGNOSIS — N508 Other specified disorders of male genital organs: Secondary | ICD-10-CM

## 2015-03-25 DIAGNOSIS — Z9660 Presence of unspecified orthopedic joint implant: Secondary | ICD-10-CM

## 2015-03-25 DIAGNOSIS — Z96649 Presence of unspecified artificial hip joint: Secondary | ICD-10-CM

## 2015-03-25 DIAGNOSIS — N5089 Other specified disorders of the male genital organs: Secondary | ICD-10-CM

## 2015-03-25 MED ORDER — CYCLOBENZAPRINE HCL 10 MG PO TABS: ORAL_TABLET | ORAL | Status: DC

## 2015-03-25 MED ORDER — OXYCODONE-ACETAMINOPHEN 10-325 MG PO TABS: 1.0000 | ORAL_TABLET | Freq: Four times a day (QID) | ORAL | Status: DC

## 2015-03-25 NOTE — Assessment & Plan Note (Signed)
3 weeks post surgery. X-rays, continue physical therapy, #120 Percocet given today. We have agreed to drop by 30 each month.

## 2015-03-25 NOTE — Assessment & Plan Note (Signed)
Has still not seen the urologist. I have again placed the referral, he is amenable to see the urologist now, he has understood the risks of untreated, undiagnosed testicular mass including cancer.

## 2015-03-25 NOTE — Progress Notes (Signed)
  Subjective:    CC: Follow-up  HPI: Post right hip arthroplasty: This pleasant 64 year old male is 3 weeks post right total [place], it seems as though we will be providing his postop care. He is having a significant amount of postoperative pain as expected, he is using approximately 5 oxycodones per day.  Testicular mass: Has still not seen the urologist, he has been made aware multiple times that the differential here includes testicular cancer. He is now amenable to seeing the urologist.  Past medical history, Surgical history, Family history not pertinant except as noted below, Social history, Allergies, and medications have been entered into the medical record, reviewed, and no changes needed.   Review of Systems: No fevers, chills, night sweats, weight loss, chest pain, or shortness of breath.   Objective:    General: Well Developed, well nourished, and in no acute distress.  Neuro: Alert and oriented x3, extra-ocular muscles intact, sensation grossly intact.  HEENT: Normocephalic, atraumatic, pupils equal round reactive to light, neck supple, no masses, no lymphadenopathy, thyroid nonpalpable.  Skin: Warm and dry, no rashes. Cardiac: Regular rate and rhythm, no murmurs rubs or gallops, no lower extremity edema.  Respiratory: Clear to auscultation bilaterally. Not using accessory muscles, speaking in full sentences. Right hip: Surgical site is clean, dry, intact with a good healing of the wound itself, he has good motion passively.  Impression and Recommendations:    I spent 25 minutes with this patient, greater than 50% was face-to-face time counseling regarding the above diagnoses

## 2015-03-26 ENCOUNTER — Ambulatory Visit (INDEPENDENT_AMBULATORY_CARE_PROVIDER_SITE_OTHER): Payer: Commercial Managed Care - PPO | Admitting: Physical Therapy

## 2015-03-26 DIAGNOSIS — M25651 Stiffness of right hip, not elsewhere classified: Secondary | ICD-10-CM

## 2015-03-26 DIAGNOSIS — R531 Weakness: Secondary | ICD-10-CM

## 2015-03-26 DIAGNOSIS — R269 Unspecified abnormalities of gait and mobility: Secondary | ICD-10-CM

## 2015-03-26 DIAGNOSIS — R609 Edema, unspecified: Secondary | ICD-10-CM | POA: Diagnosis not present

## 2015-03-26 NOTE — Patient Instructions (Signed)
Knee Extension: Quad Set (Eccentric), (Resistance Band)   Stand holding support, tubing behind fully extended knee. Slowly bend knee for 3-5 seconds. Use __green______ resistance band. _10__ reps per set, __2_ sets per day  Other exercises to continue:  * Seated knee extension (leg kicks - hold for 5 sec with knee straight) x 10 reps x 2 sets * Sit to/from standing * Seated marching  * Start walking household distances with straight cane.  * ice affected area 15 min as needed.  If would like to repeat, wait 1 hour before reapplying

## 2015-03-26 NOTE — Therapy (Signed)
[facility] [place] [address] [place], [place], [address] Phone: [phone]   Fax:  [phone]  Physical Therapy Treatment  Patient Details  Name: Christopher Robertson MRN: [id number] Date of Birth: 08-Jul-1951 Referring Provider:  Monica Becton,*  Encounter Date: 03/26/2015      PT End of Session - 03/26/15 1520    Visit Number 3   Number of Visits 20   Date for PT Re-Evaluation 05/16/15   PT Start Time 1518   PT Stop Time 1615   PT Time Calculation (min) 57 min   Activity Tolerance Patient tolerated treatment well      Past Medical History  Diagnosis Date  . Hypertension     Past Surgical History  Procedure Laterality Date  . Knee surgery  1980 &2002  . Femur fracture surgery  1985  . Total hip arthroplasty Right     There were no vitals filed for this visit.  Visit Diagnosis:  Weakness generalized  Stiffness of hip joint, right  Abnormality of gait  Edema      Subjective Assessment - 03/26/15 1521    Subjective Pt reports he saw MD; now taking Flexoril and the oxycod dose was increased.  States he had best quality sleep last night in wks.  Main complaint is tightness/stiffness in Lt groin.    Currently in Pain? Yes   Pain Score 3    Pain Location Groin   Pain Orientation Right   Pain Descriptors / Indicators Tightness            [facility] PT Assessment - 03/26/15 0001    Assessment   Medical Diagnosis Rt THA   Onset Date/Surgical Date 02/28/15                     [facility] Adult PT Treatment/Exercise - 03/26/15 0001    Ambulation/Gait   Ambulation/Gait Yes   Ambulation/Gait Assistance 4: Min guard;6: Modified independent (Device/Increase time)   Ambulation Distance (Feet) 160 Feet   Assistive device Straight cane   Gait Pattern Step-through pattern;Decreased arm swing - right;Decreased dorsiflexion - right;Decreased weight shift to right;Decreased stride  length;Decreased hip/knee flexion - right   Ambulation Surface Level;Indoor   Gait Comments VC for increased Rt heel strike and toe off,tactile assist for reciprocal arm swing once HHA removed from R hand.    Self-Care   Self-Care Other Self-Care Comments   Other Self-Care Comments  Pt educated about self massage to anterior thigh with ball/ rolling pin to decrease adhesions and decrease pain.    Knee/Hip Exercises: Standing   Heel Raises Both;1 set;10 reps   SLS Attempts at Rt SLS with light UE support on RW, too difficult. Pt voiced fearful of RLE buckling   Other Standing Knee Exercises weight shifting side/side, FWD and Back (unilateral UE support) VC    Other Standing Knee Exercises Standing LLE with RLE toe taps to front, side, back. x 10 reps.  Terminal knee ext with green band at Rt knee with 3 sec hold x 15 reps.    Knee/Hip Exercises: Seated   Long Arc Quad Right;1 set;10 reps  with 5 sec hold in ext.    Other Seated Knee/Hip Exercises seated marching x 20    Sit to Sand 1 set;10 reps  cues to lean Rt, Lt hand on RW   Modalities   Modalities Cryotherapy   Cryotherapy   Number Minutes Cryotherapy 12 Minutes   Cryotherapy Location Hip   Type  of Cryotherapy Ice pack                  PT Short Term Goals - 03/21/15 1601    PT SHORT TERM GOAL #1   Title I with initial HEP ( 04/18/15)   Time 4   Period Weeks   Status New   PT SHORT TERM GOAL #2   Title increase strength Rt hip =/> 4/5 ( 04/18/15)   Time 4   Period Weeks   Status New   PT SHORT TERM GOAL #3   Title ambulate with straight cane on even surfaces ( 04/18/15)   Time 4   Period Weeks   Status New   PT SHORT TERM GOAL #4   Title improve FOTO =/< 60% limited ( 04/18/15)   Time 4   Period Weeks   Status New   PT SHORT TERM GOAL #5   Title report pain decrease to allow him to only need OTC medication ( 04/18/15)    Time 4   Period Weeks   Status New           PT Long Term Goals - 03/21/15 1603     PT LONG TERM GOAL #1   Title I with advanced HEP ( 05/16/15)    Time 8   Period Weeks   Status New   PT LONG TERM GOAL #2   Title demo Rt LE strength =/> 5-/5 ( 05/16/15)    Time 8   Period Weeks   Status New   PT LONG TERM GOAL #3   Title ambulate without an assistive device on even and uneven surfaces ( 05/16/15)    Time 8   Period Weeks   Status New   PT LONG TERM GOAL #4   Title improve FOTO =/< 54% limited ( 05/16/15)    Time 8   Period Weeks   Status New   PT LONG TERM GOAL #5   Title perform lifitng activites to simulate work per MD restriction for lifting ( 05/16/15)    Time 8   Period Weeks   Status New   Additional Long Term Goals   Additional Long Term Goals Yes   PT LONG TERM GOAL #6   Title climb on /off back of trailer without difficulty to simulate work ( 05/16/15)    Time 8   Period Weeks   Status New               Plan - 03/26/15 1631    Clinical Impression Statement Pt initially fearful of gait trial with SPC, but quickly gained confidence and was able to decrease UE support from [facility] + HHA to just [facility].  Pt tolerated all exercises with minimal increase in pain.    Pt will benefit from skilled therapeutic intervention in order to improve on the following deficits Abnormal gait;Decreased range of motion;Increased muscle spasms;Pain;Decreased balance;Decreased mobility;Decreased strength;Increased edema   Rehab Potential Good   PT Frequency 3x / week   PT Duration 4 weeks   PT Treatment/Interventions Moist Heat;Ultrasound;Therapeutic exercise;Taping;Vasopneumatic Device;Manual techniques;Balance training;DME Instruction;Neuromuscular re-education;Gait training;Cryotherapy;Electrical Stimulation;Stair training;Patient/family education;Scar mobilization   PT Next Visit Plan progress weightbearing, ambulation wean off walker. strengthening   Consulted and Agree with Plan of Care Patient        Problem List Patient Active Problem List   Diagnosis Date  Noted  . 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 Occult blood positive stool 01/31/2014  . 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  Testicular mass 08/18/2012  . Hypertension 08/18/2012  . Smoker 08/18/2012  . Preventive measure 08/18/2012   Mayer Camel, PTA 03/26/2015 4:41 PM  [facility] [place] [address] [place], [place], [address] Phone: [phone]   Fax:  [phone]

## 2015-03-28 ENCOUNTER — Ambulatory Visit (INDEPENDENT_AMBULATORY_CARE_PROVIDER_SITE_OTHER): Payer: Commercial Managed Care - PPO | Admitting: Physical Therapy

## 2015-03-28 VITALS — BP 103/80 | HR 104

## 2015-03-28 DIAGNOSIS — R609 Edema, unspecified: Secondary | ICD-10-CM | POA: Diagnosis not present

## 2015-03-28 DIAGNOSIS — R531 Weakness: Secondary | ICD-10-CM | POA: Diagnosis not present

## 2015-03-28 DIAGNOSIS — R269 Unspecified abnormalities of gait and mobility: Secondary | ICD-10-CM | POA: Diagnosis not present

## 2015-03-28 DIAGNOSIS — M25651 Stiffness of right hip, not elsewhere classified: Secondary | ICD-10-CM

## 2015-03-28 NOTE — Therapy (Signed)
[facility] [place] [address] [place], [place], [address] Phone: [phone]   Fax:  [phone]  Physical Therapy Treatment  Patient Details  Name: Christopher Robertson MRN: [id number] Date of Birth: October 25, 1950 Referring Provider:  Monica Becton,*  Encounter Date: 03/28/2015      PT End of Session - 03/28/15 1410    Visit Number 4   Number of Visits 20   Date for PT Re-Evaluation 05/16/15   PT Start Time 1404   PT Stop Time 1459   PT Time Calculation (min) 55 min   Activity Tolerance --  Further gait trials held due to low BP, and pt feeling symptomatic      Past Medical History  Diagnosis Date  . Hypertension     Past Surgical History  Procedure Laterality Date  . Knee surgery  1980 &2002  . Femur fracture surgery  1985  . Total hip arthroplasty Right     Filed Vitals:   03/28/15 1410 03/28/15 1447 03/28/15 1501  BP: 97/63 119/80 103/80  Pulse: 103 97 104    Visit Diagnosis:  Weakness generalized  Stiffness of hip joint, right  Abnormality of gait  Edema      Subjective Assessment - 03/28/15 1410    Subjective Pt reports he has felt even more stiff in Rt ant hip since the evening of last visit. Pt reports he has been icing/ HEP/ short distance gait trials, but no relief. Pt c/o frequent buckling in Rt knee.    Currently in Pain? Yes   Pain Location Groin   Pain Orientation Right   Pain Descriptors / Indicators Tightness            [facility] PT Assessment - 03/28/15 0001    Assessment   Medical Diagnosis Rt THA   Onset Date/Surgical Date 02/28/15                     [facility] Adult PT Treatment/Exercise - 03/28/15 0001    Ambulation/Gait   Ambulation/Gait Yes   Ambulation/Gait Assistance 4: Min guard   Assistive device Straight cane for 12 ft   Gait Pattern Step-through pattern   Ambulation Surface Indoor;Level   Gait Comments VC to increase Rt knee flexion in swing     Knee/Hip Exercises: Aerobic   Nustep L3: 5 min    Knee/Hip Exercises: Standing   Other Standing Knee Exercises Standing hip flexor stretch x 30 sec x 2 (UE on RW)   Knee/Hip Exercises: Seated   Long Arc Quad Right;1 set;5 reps   Sit to [employer] 1 set;10 reps  Left ft forward to encourage Rt WB   Knee/Hip Exercises: Supine   Quad Sets Right;1 set;10 reps   Short Arc Quad Sets AROM;Right;1 set;10 reps  5 sec hold in ext   Bridges Limitations x 10 reps    Bridges with [employer] 1 set;10 reps   Straight Leg Raises AAROM;Strengthening;Right;5 reps   Straight Leg Raises Limitations able to initiate but cannot lift by self.    Modalities   Modalities Cryotherapy   Cryotherapy   Number Minutes Cryotherapy 12 Minutes   Cryotherapy Location Hip   Type of Cryotherapy Ice pack     Seated hamstring stretch x 30 sec x 2 trials.        PT Education - 03/28/15 1450    Education provided Yes   Education Details pt to attempt SLR initiation over wkend.    Person(s) Educated Patient  Methods Demonstration;Explanation   Comprehension Verbalized understanding          PT Short Term Goals - 03/21/15 1601    PT SHORT TERM GOAL #1   Title I with initial HEP ( 04/18/15)   Time 4   Period Weeks   Status New   PT SHORT TERM GOAL #2   Title increase strength Rt hip =/> 4/5 ( 04/18/15)   Time 4   Period Weeks   Status New   PT SHORT TERM GOAL #3   Title ambulate with straight cane on even surfaces ( 04/18/15)   Time 4   Period Weeks   Status New   PT SHORT TERM GOAL #4   Title improve FOTO =/< 60% limited ( 04/18/15)   Time 4   Period Weeks   Status New   PT SHORT TERM GOAL #5   Title report pain decrease to allow him to only need OTC medication ( 04/18/15)    Time 4   Period Weeks   Status New           PT Long Term Goals - 03/21/15 1603    PT LONG TERM GOAL #1   Title I with advanced HEP ( 05/16/15)    Time 8   Period Weeks   Status New   PT LONG TERM GOAL #2   Title  demo Rt LE strength =/> 5-/5 ( 05/16/15)    Time 8   Period Weeks   Status New   PT LONG TERM GOAL #3   Title ambulate without an assistive device on even and uneven surfaces ( 05/16/15)    Time 8   Period Weeks   Status New   PT LONG TERM GOAL #4   Title improve FOTO =/< 54% limited ( 05/16/15)    Time 8   Period Weeks   Status New   PT LONG TERM GOAL #5   Title perform lifitng activites to simulate work per MD restriction for lifting ( 05/16/15)    Time 8   Period Weeks   Status New   Additional Long Term Goals   Additional Long Term Goals Yes   PT LONG TERM GOAL #6   Title climb on /off back of trailer without difficulty to simulate work ( 05/16/15)    Time 8   Period Weeks   Status New            Plan - 03/28/15 1452    Clinical Impression Statement Pt demo improved Rt quad contraction, yet still unable to complete SLR due to weakness. Pt reported feeling a little light headed in seated position after supine exercises; BP ck'd and pt return to supine for cryotherapy. Further gait trials with SPC deferred for safety.    Pt will benefit from skilled therapeutic intervention in order to improve on the following deficits Abnormal gait;Decreased range of motion;Increased muscle spasms;Pain;Decreased balance;Decreased mobility;Decreased strength;Increased edema   Rehab Potential Good   PT Frequency 3x / week   PT Duration 4 weeks   PT Treatment/Interventions Moist Heat;Ultrasound;Therapeutic exercise;Taping;Vasopneumatic Device;Manual techniques;Balance training;DME Instruction;Neuromuscular re-education;Gait training;Cryotherapy;Electrical Stimulation;Stair training;Patient/family education;Scar mobilization   PT Next Visit Plan progress weightbearing, ambulation wean off walker. continue progressive strengthening to RLE.    Consulted and Agree with Plan of Care Patient        Problem List Patient Active Problem List   Diagnosis Date Noted  . 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  Occult blood positive stool 01/31/2014  . S/P right  total hip arthroplasty 12/03/2013  . Testicular mass 08/18/2012  . Hypertension 08/18/2012  . Smoker 08/18/2012  . Preventive measure 08/18/2012   Mayer Camel, PTA 03/28/2015 3:07 PM  [facility] [place] [address] [place], [place], [address] Phone: [phone]   Fax:  [phone]

## 2015-03-31 ENCOUNTER — Ambulatory Visit (INDEPENDENT_AMBULATORY_CARE_PROVIDER_SITE_OTHER): Payer: Commercial Managed Care - PPO | Admitting: Physical Therapy

## 2015-03-31 DIAGNOSIS — R531 Weakness: Secondary | ICD-10-CM

## 2015-03-31 DIAGNOSIS — R269 Unspecified abnormalities of gait and mobility: Secondary | ICD-10-CM

## 2015-03-31 DIAGNOSIS — M25651 Stiffness of right hip, not elsewhere classified: Secondary | ICD-10-CM

## 2015-03-31 DIAGNOSIS — R609 Edema, unspecified: Secondary | ICD-10-CM

## 2015-03-31 NOTE — Therapy (Signed)
[facility] [place] [address] [place], [place], [address] Phone: [phone]   Fax:  [phone]  Physical Therapy Treatment  Patient Details  Name: Christopher Robertson MRN: [id number] Date of Birth: 03/10/1951 Referring Provider:  Monica Becton,*  Encounter Date: 03/31/2015      PT End of Session - 03/31/15 1524    Visit Number 5   Number of Visits 20   Date for PT Re-Evaluation 05/16/15   PT Start Time 1520   PT Stop Time 1605   PT Time Calculation (min) 45 min      Past Medical History  Diagnosis Date  . Hypertension     Past Surgical History  Procedure Laterality Date  . Knee surgery  1980 &2002  . Femur fracture surgery  1985  . Total hip arthroplasty Right     There were no vitals filed for this visit.  Visit Diagnosis:  Weakness generalized  Stiffness of hip joint, right  Abnormality of gait  Edema      Subjective Assessment - 03/31/15 1525    Subjective Pt reports he has been doing exercises of HEP and icing on -off for 2 hrs afterward to help control pain.    Currently in Pain? Yes   Pain Score 3    Pain Location Groin   Pain Orientation Right   Pain Descriptors / Indicators Tightness   Aggravating Factors  weight bearing    Pain Relieving Factors medicine, ice             [facility] PT Assessment - 03/31/15 0001    Assessment   Medical Diagnosis Rt THA   Onset Date/Surgical Date 02/28/15                     [facility] Adult PT Treatment/Exercise - 03/31/15 0001    Ambulation/Gait   Ambulation/Gait Yes   Ambulation/Gait Assistance 4: Min assist   Ambulation Distance (Feet) 100 Feet   Assistive device Straight cane   Gait Pattern Step-through pattern   Ambulation Surface Indoor;Level   Gait Comments VC to increase Rt knee flexion in swing, tactile cue to increase reciprocal arm swing    Knee/Hip Exercises: Aerobic   Nustep L3: 7 min    Knee/Hip Exercises: Supine    Bridges Limitations mild Lt hamstring cramping with 5 reps; resolved with rest.    Henreitta Leber with Ball Squeeze 5 reps;2 sets   Straight Leg Raises AAROM;Strengthening;Right;3 sets;5 reps   Straight Leg Raises Limitations able to initiate but cannot lift by self.    Knee/Hip Exercises: Sidelying   Hip ABduction AROM;Right;2 sets;5 reps   Hip ABduction Limitations some substitution with LB   Knee/Hip Exercises: Prone   Hip Extension Right;Strengthening;2 sets;5 reps   Modalities   Modalities Cryotherapy   Cryotherapy   Number Minutes Cryotherapy 12 Minutes   Cryotherapy Location Hip   Type of Cryotherapy Ice pack                  PT Short Term Goals - 03/21/15 1601    PT SHORT TERM GOAL #1   Title I with initial HEP ( 04/18/15)   Time 4   Period Weeks   Status New   PT SHORT TERM GOAL #2   Title increase strength Rt hip =/> 4/5 ( 04/18/15)   Time 4   Period Weeks   Status New   PT SHORT TERM GOAL #3   Title ambulate with straight cane on even  surfaces ( 04/18/15)   Time 4   Period Weeks   Status New   PT SHORT TERM GOAL #4   Title improve FOTO =/< 60% limited ( 04/18/15)   Time 4   Period Weeks   Status New   PT SHORT TERM GOAL #5   Title report pain decrease to allow him to only need OTC medication ( 04/18/15)    Time 4   Period Weeks   Status New           PT Long Term Goals - 03/21/15 1603    PT LONG TERM GOAL #1   Title I with advanced HEP ( 05/16/15)    Time 8   Period Weeks   Status New   PT LONG TERM GOAL #2   Title demo Rt LE strength =/> 5-/5 ( 05/16/15)    Time 8   Period Weeks   Status New   PT LONG TERM GOAL #3   Title ambulate without an assistive device on even and uneven surfaces ( 05/16/15)    Time 8   Period Weeks   Status New   PT LONG TERM GOAL #4   Title improve FOTO =/< 54% limited ( 05/16/15)    Time 8   Period Weeks   Status New   PT LONG TERM GOAL #5   Title perform lifitng activites to simulate work per MD restriction  for lifting ( 05/16/15)    Time 8   Period Weeks   Status New   Additional Long Term Goals   Additional Long Term Goals Yes   PT LONG TERM GOAL #6   Title climb on /off back of trailer without difficulty to simulate work ( 05/16/15)    Time 8   Period Weeks   Status New               Plan - 03/31/15 1553    Clinical Impression Statement Pt still unable to complete SLR with hip flexion, but able to complete 2 sets of 5 of hip abd in sidelying/ ext in prone.  Pt had some hamstring cramping with bridges today.  Pt's gait distance limited by pain.    Pt will benefit from skilled therapeutic intervention in order to improve on the following deficits Abnormal gait;Decreased range of motion;Increased muscle spasms;Pain;Decreased balance;Decreased mobility;Decreased strength;Increased edema   Rehab Potential Good   PT Frequency 3x / week   PT Duration 4 weeks   PT Treatment/Interventions Moist Heat;Ultrasound;Therapeutic exercise;Taping;Vasopneumatic Device;Manual techniques;Balance training;DME Instruction;Neuromuscular re-education;Gait training;Cryotherapy;Electrical Stimulation;Stair training;Patient/family education;Scar mobilization   PT Next Visit Plan progress weightbearing, ambulation wean off walker. continue progressive strengthening to RLE.    Consulted and Agree with Plan of Care Patient        Problem List Patient Active Problem List   Diagnosis Date Noted  . 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 Occult blood positive stool 01/31/2014  . 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 Testicular mass 08/18/2012  . Hypertension 08/18/2012  . Smoker 08/18/2012  . Preventive measure 08/18/2012   Mayer Camel, PTA 03/31/2015 3:55 PM   [facility] [place] [address] [place], [place], [address] Phone: [phone]   Fax:  [phone]

## 2015-04-02 ENCOUNTER — Ambulatory Visit (INDEPENDENT_AMBULATORY_CARE_PROVIDER_SITE_OTHER): Payer: Commercial Managed Care - PPO | Admitting: Physical Therapy

## 2015-04-02 DIAGNOSIS — R531 Weakness: Secondary | ICD-10-CM

## 2015-04-02 DIAGNOSIS — R269 Unspecified abnormalities of gait and mobility: Secondary | ICD-10-CM | POA: Diagnosis not present

## 2015-04-02 DIAGNOSIS — M25651 Stiffness of right hip, not elsewhere classified: Secondary | ICD-10-CM

## 2015-04-02 NOTE — Therapy (Signed)
[facility] [place] [address] [place], [place], [address] Phone: [phone]   Fax:  [phone]  Physical Therapy Treatment  Patient Details  Name: Christopher Robertson MRN: [id number] Date of Birth: 06-23-1951 Referring Provider:  Rodolph Bong, MD  Encounter Date: 04/02/2015      PT End of Session - 04/02/15 1625    Visit Number 6   Number of Visits 20   Date for PT Re-Evaluation 05/16/15   PT Start Time 1517   PT Stop Time 1615   PT Time Calculation (min) 58 min   Activity Tolerance Patient limited by pain      Past Medical History  Diagnosis Date  . Hypertension     Past Surgical History  Procedure Laterality Date  . Knee surgery  1980 &2002  . Femur fracture surgery  1985  . Total hip arthroplasty Right     There were no vitals filed for this visit.  Visit Diagnosis:  Weakness generalized  Stiffness of hip joint, right  Abnormality of gait      Subjective Assessment - 04/02/15 1528    Subjective "It's been a rough week".  Pt c/o difficulty with HEP exercises due to stiffness/weakness.    Currently in Pain? Yes   Pain Score 2   "mostly stiff"    Pain Location Groin   Pain Orientation Right   Pain Descriptors / Indicators Tightness   Aggravating Factors  weightbearing    Pain Relieving Factors medicine, ice.             [facility] PT Assessment - 04/02/15 0001    Assessment   Medical Diagnosis Rt THA   Onset Date/Surgical Date 02/28/15   Next MD Visit 04/22/15   Prior Therapy HHPT in [place]   Precautions   Precautions Anterior Hip   Restrictions   Weight Bearing Restrictions Yes   Other Position/Activity Restrictions WBAT   Strength   Strength Assessment Site Hip   Right/Left Hip Right   Right Hip Flexion 2+/5   Right Hip Extension 4/5   Right Hip ABduction 4-/5                     [facility] Adult PT Treatment/Exercise - 04/02/15 0001    Ambulation/Gait   Ambulation/Gait Yes    Ambulation/Gait Assistance 4: Min guard   Ambulation Distance (Feet) 100 Feet   Assistive device Straight cane;1 person hand held assist   Gait Pattern Decreased arm swing - right;Step-through pattern;Decreased hip/knee flexion - right   Ambulation Surface Level;Indoor   Gait Comments VC to increase Rt knee flexion in swing, tactile cue to increase reciprocal arm swing    Exercises   Exercises Knee/Hip   Knee/Hip Exercises: Stretches   Lobbyist Right;30 seconds;3 reps   Knee/Hip Exercises: Standing   Terminal Knee Extension Right;1 set;15 reps   Theraband Level (Terminal Knee Extension) Level 2 (Red)   Hip Extension AROM;Right  2 reps, hip flexor stretch with UE on RW   Knee/Hip Exercises: Seated   Other Seated Knee/Hip Exercises seated marching x 20    Knee/Hip Exercises: Supine   Straight Leg Raises AAROM;Right;1 set;5 reps   Knee/Hip Exercises: Sidelying   Hip ABduction Right;1 set;10 reps   Knee/Hip Exercises: Prone   Hip Extension Right;1 set;10 reps   Modalities   Modalities Cryotherapy   Cryotherapy   Number Minutes Cryotherapy 12 Minutes   Cryotherapy Location Hip   Type of Cryotherapy Ice pack  PT Short Term Goals - 03/21/15 1601    PT SHORT TERM GOAL #1   Title I with initial HEP ( 04/18/15)   Time 4   Period Weeks   Status New   PT SHORT TERM GOAL #2   Title increase strength Rt hip =/> 4/5 ( 04/18/15)   Time 4   Period Weeks   Status New   PT SHORT TERM GOAL #3   Title ambulate with straight cane on even surfaces ( 04/18/15)   Time 4   Period Weeks   Status New   PT SHORT TERM GOAL #4   Title improve FOTO =/< 60% limited ( 04/18/15)   Time 4   Period Weeks   Status New   PT SHORT TERM GOAL #5   Title report pain decrease to allow him to only need OTC medication ( 04/18/15)    Time 4   Period Weeks   Status New           PT Long Term Goals - 03/21/15 1603    PT LONG TERM GOAL #1   Title I with advanced HEP (  05/16/15)    Time 8   Period Weeks   Status New   PT LONG TERM GOAL #2   Title demo Rt LE strength =/> 5-/5 ( 05/16/15)    Time 8   Period Weeks   Status New   PT LONG TERM GOAL #3   Title ambulate without an assistive device on even and uneven surfaces ( 05/16/15)    Time 8   Period Weeks   Status New   PT LONG TERM GOAL #4   Title improve FOTO =/< 54% limited ( 05/16/15)    Time 8   Period Weeks   Status New   PT LONG TERM GOAL #5   Title perform lifitng activites to simulate work per MD restriction for lifting ( 05/16/15)    Time 8   Period Weeks   Status New   Additional Long Term Goals   Additional Long Term Goals Yes   PT LONG TERM GOAL #6   Title climb on /off back of trailer without difficulty to simulate work ( 05/16/15)    Time 8   Period Weeks   Status New               Plan - 04/02/15 1721    Clinical Impression Statement Pt demo slight increase in Rt hip strength.  Pt continues to demo decreased strength in R hip and decreased tolerance to WB, affecting ability to work on gait training further than 40-60 ft at a time. Pt progressing towards goals.    Pt will benefit from skilled therapeutic intervention in order to improve on the following deficits Abnormal gait;Decreased range of motion;Increased muscle spasms;Pain;Decreased balance;Decreased mobility;Decreased strength;Increased edema   Rehab Potential Good   Clinical Impairments Affecting Rehab Potential previous injuries to the Rt femur   PT Frequency 3x / week   PT Duration 4 weeks   PT Treatment/Interventions Moist Heat;Ultrasound;Therapeutic exercise;Taping;Vasopneumatic Device;Manual techniques;Balance training;DME Instruction;Neuromuscular re-education;Gait training;Cryotherapy;Electrical Stimulation;Stair training;Patient/family education;Scar mobilization   PT Next Visit Plan progress weightbearing, ambulation wean off walker. continue progressive strengthening to RLE.    Consulted and Agree with  Plan of Care Patient        Problem List Patient Active Problem List   Diagnosis Date Noted  . 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 Occult blood positive stool 01/31/2014  . 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  Testicular mass 08/18/2012  . Hypertension 08/18/2012  . Smoker 08/18/2012  . Preventive measure 08/18/2012    Salvadore Oxford 04/02/2015, 5:25 PM  [facility] [address] [place], [place], [address] Phone: [phone]   Fax:  [phone]

## 2015-04-04 ENCOUNTER — Ambulatory Visit (INDEPENDENT_AMBULATORY_CARE_PROVIDER_SITE_OTHER): Payer: Commercial Managed Care - PPO | Admitting: Physical Therapy

## 2015-04-04 ENCOUNTER — Encounter: Payer: Self-pay | Admitting: Sports Medicine

## 2015-04-04 ENCOUNTER — Ambulatory Visit (INDEPENDENT_AMBULATORY_CARE_PROVIDER_SITE_OTHER): Payer: Commercial Managed Care - PPO | Admitting: Sports Medicine

## 2015-04-04 VITALS — BP 155/83 | HR 104 | Ht 74.0 in | Wt 267.0 lb

## 2015-04-04 DIAGNOSIS — R609 Edema, unspecified: Secondary | ICD-10-CM | POA: Diagnosis not present

## 2015-04-04 DIAGNOSIS — Z96649 Presence of unspecified artificial hip joint: Secondary | ICD-10-CM

## 2015-04-04 DIAGNOSIS — R269 Unspecified abnormalities of gait and mobility: Secondary | ICD-10-CM

## 2015-04-04 DIAGNOSIS — R531 Weakness: Secondary | ICD-10-CM | POA: Diagnosis not present

## 2015-04-04 DIAGNOSIS — Z9660 Presence of unspecified orthopedic joint implant: Secondary | ICD-10-CM | POA: Diagnosis not present

## 2015-04-04 DIAGNOSIS — M25651 Stiffness of right hip, not elsewhere classified: Secondary | ICD-10-CM | POA: Diagnosis not present

## 2015-04-04 NOTE — Assessment & Plan Note (Signed)
Additional disability form filled out today.

## 2015-04-04 NOTE — Progress Notes (Signed)
   Additional disability forms filled out today. I spent 10 minutes with this patient, greater than 50% was face-to-face time counseling regarding the above diagnoses

## 2015-04-04 NOTE — Therapy (Signed)
[facility] [place] [address] [place], [place], [address] Phone: [phone]   Fax:  [phone]  Physical Therapy Treatment  Patient Details  Name: Christopher Robertson MRN: [id number] Date of Birth: 1951-07-22 Referring Provider:  Rodolph Bong, MD  Encounter Date: 04/04/2015      PT End of Session - 04/04/15 1458    Visit Number 7   Number of Visits 20   Date for PT Re-Evaluation 05/16/15   PT Start Time 1452   PT Stop Time 1542   PT Time Calculation (min) 50 min   Activity Tolerance Patient limited by pain;Patient limited by fatigue      Past Medical History  Diagnosis Date  . Hypertension     Past Surgical History  Procedure Laterality Date  . Knee surgery  1980 &2002  . Femur fracture surgery  1985  . Total hip arthroplasty Right     There were no vitals filed for this visit.  Visit Diagnosis:  Weakness generalized  Stiffness of hip joint, right  Abnormality of gait  Edema      Subjective Assessment - 04/04/15 1456    Subjective After Monday's session, pt reported he didn't need any pain medication Tuesday.  But Thursday was "a horrible day"; pt had high pain level. Pt feels like he is pushing himself at home with HEP.    Currently in Pain? Yes   Pain Score 1   also very stiff    Pain Location Groin   Pain Orientation Right   Pain Descriptors / Indicators Sore;Tightness            [facility] PT Assessment - 04/04/15 0001    Assessment   Medical Diagnosis Rt THA   Onset Date/Surgical Date 02/28/15   Next MD Visit 04/22/15   Prior Therapy HHPT in [place]                     [facility] Adult PT Treatment/Exercise - 04/04/15 0001    Ambulation/Gait   Ambulation/Gait Yes   Ambulation/Gait Assistance --  CGA   Ambulation Distance (Feet) --  10 ft x 4 reps; 25 ft x 5 reps   Assistive device Straight cane;Small based quad cane  one in each hand   Gait Pattern Step-through  pattern;Decreased arm swing - right;Decreased stride length;Decreased hip/knee flexion - right;Decreased weight shift to right;Antalgic   Ambulation Surface Level;Indoor   Gait Comments Pt instructed in reciprocal arm swing with bilateral canes. Pt demo difficulty with coordination - but with repetition, pt improved gait quality. Pt demo improved posture, weight shift, and Rt knee flexion with VC.     Exercises   Exercises Knee/Hip   Knee/Hip Exercises: Stretches   Quad Stretch Right;3 reps;30 seconds   Hip Flexor Stretch Right;3 reps;20 seconds  (Standing with UE support)   Gastroc Stretch Left;Right;2 reps;30 seconds   Knee/Hip Exercises: Aerobic   Nustep L4: 6 min    Knee/Hip Exercises: Standing   Heel Raises Both;1 set;10 reps  improved wt distribution and form   Other Standing Knee Exercises Standing forward backward weight shifts in tandem stance x 10, Rt knee flex/ext x 10    Modalities   Modalities Cryotherapy   Cryotherapy   Number Minutes Cryotherapy 12 Minutes   Cryotherapy Location Hip   Type of Cryotherapy Ice pack                  PT Short Term Goals - 03/21/15 1601  PT SHORT TERM GOAL #1   Title I with initial HEP ( 04/18/15)   Time 4   Period Weeks   Status New   PT SHORT TERM GOAL #2   Title increase strength Rt hip =/> 4/5 ( 04/18/15)   Time 4   Period Weeks   Status New   PT SHORT TERM GOAL #3   Title ambulate with straight cane on even surfaces ( 04/18/15)   Time 4   Period Weeks   Status New   PT SHORT TERM GOAL #4   Title improve FOTO =/< 60% limited ( 04/18/15)   Time 4   Period Weeks   Status New   PT SHORT TERM GOAL #5   Title report pain decrease to allow him to only need OTC medication ( 04/18/15)    Time 4   Period Weeks   Status New           PT Long Term Goals - 03/21/15 1603    PT LONG TERM GOAL #1   Title I with advanced HEP ( 05/16/15)    Time 8   Period Weeks   Status New   PT LONG TERM GOAL #2   Title demo Rt LE  strength =/> 5-/5 ( 05/16/15)    Time 8   Period Weeks   Status New   PT LONG TERM GOAL #3   Title ambulate without an assistive device on even and uneven surfaces ( 05/16/15)    Time 8   Period Weeks   Status New   PT LONG TERM GOAL #4   Title improve FOTO =/< 54% limited ( 05/16/15)    Time 8   Period Weeks   Status New   PT LONG TERM GOAL #5   Title perform lifitng activites to simulate work per MD restriction for lifting ( 05/16/15)    Time 8   Period Weeks   Status New   Additional Long Term Goals   Additional Long Term Goals Yes   PT LONG TERM GOAL #6   Title climb on /off back of trailer without difficulty to simulate work ( 05/16/15)    Time 8   Period Weeks   Status New               Plan - 04/04/15 1555    Clinical Impression Statement Pt demo improved posture and weight bearing with weight shifts and heel raises. With repetition- increased short gait trials- pt demo improved gait quality using bilateral straight canes. Pt progressing towards goals.    Pt will benefit from skilled therapeutic intervention in order to improve on the following deficits Abnormal gait;Decreased range of motion;Increased muscle spasms;Pain;Decreased balance;Decreased mobility;Decreased strength;Increased edema   Rehab Potential Good   PT Frequency 3x / week   PT Duration 4 weeks   PT Treatment/Interventions Moist Heat;Ultrasound;Therapeutic exercise;Taping;Vasopneumatic Device;Manual techniques;Balance training;DME Instruction;Neuromuscular re-education;Gait training;Cryotherapy;Electrical Stimulation;Stair training;Patient/family education;Scar mobilization   PT Next Visit Plan progress weightbearing, ambulation wean off walker. continue progressive strengthening to RLE.    Consulted and Agree with Plan of Care Patient        Problem List Patient Active Problem List   Diagnosis Date Noted  . 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 Occult blood positive stool 01/31/2014  . S/P right total  hip arthroplasty 12/03/2013  . Testicular mass 08/18/2012  . Hypertension 08/18/2012  . Smoker 08/18/2012  . Preventive measure 08/18/2012   Mayer Camel, PTA 04/04/2015 3:59 PM  [facility]  [place], [place], [address] Phone: [phone]   Fax:  [phone]

## 2015-04-07 ENCOUNTER — Encounter: Payer: Commercial Managed Care - PPO | Admitting: Physical Therapy

## 2015-04-09 ENCOUNTER — Ambulatory Visit (INDEPENDENT_AMBULATORY_CARE_PROVIDER_SITE_OTHER): Payer: Commercial Managed Care - PPO | Admitting: Physical Therapy

## 2015-04-09 DIAGNOSIS — R269 Unspecified abnormalities of gait and mobility: Secondary | ICD-10-CM | POA: Diagnosis not present

## 2015-04-09 DIAGNOSIS — M25651 Stiffness of right hip, not elsewhere classified: Secondary | ICD-10-CM | POA: Diagnosis not present

## 2015-04-09 DIAGNOSIS — R531 Weakness: Secondary | ICD-10-CM

## 2015-04-09 DIAGNOSIS — R609 Edema, unspecified: Secondary | ICD-10-CM

## 2015-04-09 NOTE — Therapy (Signed)
[place], [place], [address] Phone: [phone]   Fax:  [phone]  Physical Therapy Treatment  Patient Details  Name: Christopher Robertson MRN: [id number] Date of Birth: 02-23-1951 Referring Provider:  Gregor Hams, MD  Encounter Date: 04/09/2015      PT End of Session - 04/09/15 1435    Visit Number 8   Number of Visits 20   Date for PT Re-Evaluation 05/16/15   PT Start Time 1430   PT Stop Time 5789   PT Time Calculation (min) 63 min   Activity Tolerance Patient limited by pain;Patient limited by fatigue      Past Medical History  Diagnosis Date  . Hypertension     Past Surgical History  Procedure Laterality Date  . Knee surgery  1980 &2002  . Femur fracture surgery  1985  . Total hip arthroplasty Right     There were no vitals filed for this visit.  Visit Diagnosis:  Weakness generalized  Abnormality of gait  Stiffness of hip joint, right  Edema      Subjective Assessment - 04/09/15 1436    Subjective Pt reports that every other day is "good" (with decrease in pain). He states he really pushes himself, and then the next day he hurts really bad.  Still only furniture walking at home, not  consistently using assistive device.    Currently in Pain? Yes   Pain Score 4    Pain Location Groin   Pain Orientation Right   Pain Descriptors / Indicators Sore;Tightness   Aggravating Factors  weight bearing    Pain Relieving Factors medicine, ice             [facility] PT Assessment - 04/09/15 0001    Assessment   Medical Diagnosis Rt THA   Onset Date/Surgical Date 02/28/15   Next MD Visit 04/22/15   Prior Therapy HHPT in [place]   Balance   Balance Assessed Yes   Standardized Balance Assessment   Standardized Balance Assessment Timed Up and Go Test   Timed Up and Go Test   TUG Normal TUG   Normal TUG (seconds) 28  pt demo difficulty with turns           [facility] Adult  PT Treatment/Exercise - 04/09/15 0001    Ambulation/Gait   Ambulation/Gait Yes   Ambulation/Gait Assistance 4: Min guard;4: Min assist   Ambulation Distance (Feet) 15 Feet   Assistive device 1 person hand held assist;Straight cane   Gait Pattern Step-through pattern   Gait Comments Pt demo difficulty with turns; reported "charlie horse" in Rt quad after attempting to pivot/turn Lt; declined further trials.    Knee/Hip Exercises: Stretches   Sports administrator Right;3 reps;30 seconds   Hip Flexor Stretch Right;3 reps;20 seconds  (Standing with UE support) Then Prone with pillow under Rt knee - 2 min    Gastroc Stretch Left;Right;2 reps;30 seconds   Knee/Hip Exercises: Aerobic   Nustep L3: 6 min   Knee/Hip Exercises: Standing   Lateral Step Up Right;1 set;10 reps. 3" step with BUE support   Forward Step Up Right;2 sets;5 sets  3" step with BUE support    Knee/Hip Exercises: Supine   Other Supine Knee/Hip Exercises bridges x 10 reps    Knee/Hip Exercises: Prone   Hip Extension Strengthening;Right;1 set;10 reps   Modalities   Modalities Cryotherapy   Cryotherapy   Number Minutes Cryotherapy 12 Minutes   Cryotherapy Location Hip  Type of Cryotherapy Ice pack                PT Education - 04/09/15 1637    Education provided Yes   Education Details Pt to perform self (scar) massage, perform prone hip stretch, quad stretch, and short gait trials in his home with cane instead of furniture walking.    Person(s) Educated Patient   Methods Explanation   Comprehension Returned demonstration;Verbalized understanding          PT Short Term Goals - 03/21/15 1601    PT SHORT TERM GOAL #1   Title I with initial HEP ( 04/18/15)   Time 4   Period Weeks   Status New   PT SHORT TERM GOAL #2   Title increase strength Rt hip =/> 4/5 ( 04/18/15)   Time 4   Period Weeks   Status New   PT SHORT TERM GOAL #3   Title ambulate with straight cane on even surfaces ( 04/18/15)   Time 4    Period Weeks   Status New   PT SHORT TERM GOAL #4   Title improve FOTO =/< 60% limited ( 04/18/15)   Time 4   Period Weeks   Status New   PT SHORT TERM GOAL #5   Title report pain decrease to allow him to only need OTC medication ( 04/18/15)    Time 4   Period Weeks   Status New           PT Long Term Goals - 03/21/15 1603    PT LONG TERM GOAL #1   Title I with advanced HEP ( 05/16/15)    Time 8   Period Weeks   Status New   PT LONG TERM GOAL #2   Title demo Rt LE strength =/> 5-/5 ( 05/16/15)    Time 8   Period Weeks   Status New   PT LONG TERM GOAL #3   Title ambulate without an assistive device on even and uneven surfaces ( 05/16/15)    Time 8   Period Weeks   Status New   PT LONG TERM GOAL #4   Title improve FOTO =/< 54% limited ( 05/16/15)    Time 8   Period Weeks   Status New   PT LONG TERM GOAL #5   Title perform lifitng activites to simulate work per MD restriction for lifting ( 05/16/15)    Time 8   Period Weeks   Status New   Additional Long Term Goals   Additional Long Term Goals Yes   PT LONG TERM GOAL #6   Title climb on /off back of trailer without difficulty to simulate work ( 05/16/15)    Time 8   Period Weeks   Status New               Plan - 04/09/15 1639    Clinical Impression Statement Pt presents with decreased tolerance for gait trials with SPC this date due to increased pain in Rt hip with WB (more than in past).  Performed TUG: 28 sec - scores >13 sec are indicative of increased risk of fall in community.  Responded well to prone hip flexor / quad stretch, noting decreased pain and stiffness afterward.  No goals met.    Pt will benefit from skilled therapeutic intervention in order to improve on the following deficits Abnormal gait;Decreased range of motion;Increased muscle spasms;Pain;Decreased balance;Decreased mobility;Decreased strength;Increased edema   Rehab Potential Good   Clinical  Impairments Affecting Rehab Potential previous  injuries to the Rt femur   PT Frequency 3x / week   PT Duration 4 weeks   PT Treatment/Interventions Moist Heat;Ultrasound;Therapeutic exercise;Taping;Vasopneumatic Device;Manual techniques;Balance training;DME Instruction;Neuromuscular re-education;Gait training;Cryotherapy;Electrical Stimulation;Stair training;Patient/family education;Scar mobilization   PT Next Visit Plan Assess hip strength next visit.    Consulted and Agree with Plan of Care Patient        Problem List Patient Active Problem List   Diagnosis Date Noted  . 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 Occult blood positive stool 01/31/2014  . 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 Testicular mass 08/18/2012  . Hypertension 08/18/2012  . Smoker 08/18/2012  . Preventive measure 08/18/2012    Kerin Perna, PTA 04/09/2015 4:47 PM  [place], [place], [address] Phone: [phone]   Fax:  [phone]

## 2015-04-11 ENCOUNTER — Ambulatory Visit (INDEPENDENT_AMBULATORY_CARE_PROVIDER_SITE_OTHER): Payer: Commercial Managed Care - PPO | Admitting: Physical Therapy

## 2015-04-11 DIAGNOSIS — R609 Edema, unspecified: Secondary | ICD-10-CM | POA: Diagnosis not present

## 2015-04-11 DIAGNOSIS — R269 Unspecified abnormalities of gait and mobility: Secondary | ICD-10-CM

## 2015-04-11 DIAGNOSIS — R531 Weakness: Secondary | ICD-10-CM | POA: Diagnosis not present

## 2015-04-11 DIAGNOSIS — M25651 Stiffness of right hip, not elsewhere classified: Secondary | ICD-10-CM | POA: Diagnosis not present

## 2015-04-11 NOTE — Therapy (Signed)
[facility] [place] [address] [place] [address] [place], [place], [address] Phone: [phone]   Fax:  [phone]  Physical Therapy Treatment  Patient Details  Name: Christopher Robertson MRN: [id number] Date of Birth: 27-Sep-1950 Referring Provider:  Rodolph Bong, MD  Encounter Date: 04/11/2015      PT End of Session - 04/11/15 1452    Visit Number 9   Number of Visits 20   Date for PT Re-Evaluation 05/16/15   PT Start Time 1445   PT Stop Time 1540   PT Time Calculation (min) 55 min      Past Medical History  Diagnosis Date  . Hypertension     Past Surgical History  Procedure Laterality Date  . Knee surgery  1980 &2002  . Femur fracture surgery  1985  . Total hip arthroplasty Right     There were no vitals filed for this visit.  Visit Diagnosis:  Weakness generalized  Abnormality of gait  Stiffness of hip joint, right  Edema          [facility] PT Assessment - 04/11/15 0001    Assessment   Medical Diagnosis Rt THA   Onset Date/Surgical Date 02/28/15   Next MD Visit 04/22/15   Prior Therapy HHPT in [place]   Strength   Strength Assessment Site Hip   Right/Left Hip Right   Right Hip Flexion 3/5   Right Hip Extension 4/5   Right Hip ABduction 5/5          [facility] Adult PT Treatment/Exercise - 04/11/15 0001    Knee/Hip Exercises: Stretches   Other Knee/Hip Stretches Hip adductor stretch (frog) x 30 sec x 2 reps    Knee/Hip Exercises: Aerobic   Nustep L4: 5 min    Knee/Hip Exercises: Supine   Straight Leg Raises Strengthening;Right;2 sets;5 reps   Other Supine Knee/Hip Exercises bridges x 10 reps x 2 sets    Other Supine Knee/Hip Exercises Hip flexor stretch off edge of high/low table for 30 sec x 2; followed by Rt LE off the table returning to the table x 5 reps    Knee/Hip Exercises: Sidelying   Clams x 10 reps x 2 sets, RLE   Knee/Hip Exercises: Prone   Hamstring Curl 10 reps;2 sets  2# on ankle   Hip Extension  Right;2 sets;10 reps   Modalities   Modalities Cryotherapy   Cryotherapy   Number Minutes Cryotherapy 12 Minutes   Cryotherapy Location Hip   Type of Cryotherapy Ice pack            PT Education - 04/11/15 1531    Education provided Yes   Education Details added 5 rep sets of SLR, hip ext stretches, walking trials with cane in home.    Person(s) Educated Patient   Methods Explanation   Comprehension Verbalized understanding;Returned demonstration          PT Short Term Goals - 03/21/15 1601    PT SHORT TERM GOAL #1   Title I with initial HEP ( 04/18/15)   Time 4   Period Weeks   Status New   PT SHORT TERM GOAL #2   Title increase strength Rt hip =/> 4/5 ( 04/18/15)   Time 4   Period Weeks   Status New   PT SHORT TERM GOAL #3   Title ambulate with straight cane on even surfaces ( 04/18/15)   Time 4   Period Weeks   Status New   PT SHORT TERM GOAL #4  Title improve FOTO =/< 60% limited ( 04/18/15)   Time 4   Period Weeks   Status New   PT SHORT TERM GOAL #5   Title report pain decrease to allow him to only need OTC medication ( 04/18/15)    Time 4   Period Weeks   Status New           PT Long Term Goals - 03/21/15 1603    PT LONG TERM GOAL #1   Title I with advanced HEP ( 05/16/15)    Time 8   Period Weeks   Status New   PT LONG TERM GOAL #2   Title demo Rt LE strength =/> 5-/5 ( 05/16/15)    Time 8   Period Weeks   Status New   PT LONG TERM GOAL #3   Title ambulate without an assistive device on even and uneven surfaces ( 05/16/15)    Time 8   Period Weeks   Status New   PT LONG TERM GOAL #4   Title improve FOTO =/< 54% limited ( 05/16/15)    Time 8   Period Weeks   Status New   PT LONG TERM GOAL #5   Title perform lifitng activites to simulate work per MD restriction for lifting ( 05/16/15)    Time 8   Period Weeks   Status New   Additional Long Term Goals   Additional Long Term Goals Yes   PT LONG TERM GOAL #6   Title climb on /off back of  trailer without difficulty to simulate work ( 05/16/15)    Time 8   Period Weeks   Status New               Plan - 04/11/15 1524    Clinical Impression Statement Pt demo improved Rt hip strength; now able to complete SLR on own.  Pt continues to have difficulty with gait/ WB activities due to increased pain during standing. Progressing towards goals.    Pt will benefit from skilled therapeutic intervention in order to improve on the following deficits Abnormal gait;Decreased range of motion;Increased muscle spasms;Pain;Decreased balance;Decreased mobility;Decreased strength;Increased edema   Rehab Potential Good   Clinical Impairments Affecting Rehab Potential previous injuries to the Rt femur   PT Frequency 3x / week   PT Duration 4 weeks   PT Treatment/Interventions Moist Heat;Ultrasound;Therapeutic exercise;Taping;Vasopneumatic Device;Manual techniques;Balance training;DME Instruction;Neuromuscular re-education;Gait training;Cryotherapy;Electrical Stimulation;Stair training;Patient/family education;Scar mobilization   PT Next Visit Plan continue progressive strengthening/stretching to Rt hip, gait trials    Consulted and Agree with Plan of Care Patient        Problem List Patient Active Problem List   Diagnosis Date Noted  . 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 Occult blood positive stool 01/31/2014  . 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 Testicular mass 08/18/2012  . Hypertension 08/18/2012  . Smoker 08/18/2012  . Preventive measure 08/18/2012   Mayer Camel, PTA 04/11/2015 4:45 PM   [facility] [place] [address] [place] [address] [place], [place], [address] Phone: [phone]   Fax:  [phone]

## 2015-04-18 ENCOUNTER — Ambulatory Visit (INDEPENDENT_AMBULATORY_CARE_PROVIDER_SITE_OTHER): Payer: Commercial Managed Care - PPO | Admitting: Physical Therapy

## 2015-04-18 DIAGNOSIS — M25651 Stiffness of right hip, not elsewhere classified: Secondary | ICD-10-CM | POA: Diagnosis not present

## 2015-04-18 DIAGNOSIS — R269 Unspecified abnormalities of gait and mobility: Secondary | ICD-10-CM | POA: Diagnosis not present

## 2015-04-18 DIAGNOSIS — R531 Weakness: Secondary | ICD-10-CM

## 2015-04-18 NOTE — Therapy (Signed)
[facility] [place] [address] [place] [address] [place], [place], [address] Phone: [phone]   Fax:  [phone]  Physical Therapy Treatment  Patient Details  Name: Christopher Robertson MRN: [id number] Date of Birth: 23-Aug-1951 Referring Provider:  Monica Becton,*  Encounter Date: 04/18/2015      PT End of Session - 04/18/15 1539    Visit Number 10   Number of Visits 20   Date for PT Re-Evaluation 05/16/15   PT Start Time 1537   PT Stop Time 1627   PT Time Calculation (min) 50 min      Past Medical History  Diagnosis Date  . Hypertension     Past Surgical History  Procedure Laterality Date  . Knee surgery  1980 &2002  . Femur fracture surgery  1985  . Total hip arthroplasty Right     There were no vitals filed for this visit.  Visit Diagnosis:  Weakness generalized  Stiffness of hip joint, right  Abnormality of gait      Subjective Assessment - 04/18/15 1540    Subjective Pt reports he has not been able to lift his Rt leg since day following last visit; very painful for weightbearing. Pt reports he is fearful of something happening with his Rt femur due to pain where break previously was, along ITB, and groin.  Pt backed off exercises and was in bed most of week.    Currently in Pain? Yes   Pain Score 7    Pain Location Groin   Pain Orientation Right   Pain Descriptors / Indicators Pounding;Tightness;Sharp   Aggravating Factors  weightbearing    Pain Relieving Factors medicine, ice            [facility] PT Assessment - 04/18/15 0001    Assessment   Medical Diagnosis Rt THA   Onset Date/Surgical Date 02/28/15   Next MD Visit 04/22/15   Prior Therapy HHPT in [place]   Strength   Strength Assessment Site Hip   Right/Left Hip Right   Right Hip Flexion 2-/5   Right Hip Extension 3-/5   Right Hip ABduction 3-/5                     [facility] Adult PT Treatment/Exercise - 04/18/15 0001    Knee/Hip  Exercises: Stretches   Lobbyist Right;3 reps;30 seconds   Hip Flexor Stretch Right;3 reps;20 seconds  (Standing with UE support)   Other Knee/Hip Stretches Hip adductor stretch (frog) x 30 sec x 2 reps    Knee/Hip Exercises: Aerobic   Nustep L4: 7 min    Knee/Hip Exercises: Standing   Other Standing Knee Exercises Standing weight  shifts Rt/Lt with UE support x 10    Knee/Hip Exercises: Seated   Long Arc Quad Strengthening;Right;2 sets;10 reps  seated (elevated) on high low table   Long Arc Quad Weight 3 lbs.   Other Seated Knee/Hip Exercises seated marching x 20    Sit to Sand 5 reps;with UE support   Knee/Hip Exercises: Prone   Hamstring Curl 10 reps;3 sets  3# wt on Rt ankle.    Modalities   Modalities Cryotherapy;Electrical Stimulation   Cryotherapy   Number Minutes Cryotherapy 12 Minutes   Cryotherapy Location Hip   Type of Cryotherapy Ice pack   Electrical Stimulation   Electrical Stimulation Location Rt quad and groin   Electrical Stimulation Action IFC   Electrical Stimulation Parameters to tolerance, 12 min    Electrical Stimulation Goals Pain  PT Short Term Goals - 03/21/15 1601    PT SHORT TERM GOAL #1   Title I with initial HEP ( 04/18/15)   Time 4   Period Weeks   Status New   PT SHORT TERM GOAL #2   Title increase strength Rt hip =/> 4/5 ( 04/18/15)   Time 4   Period Weeks   Status New   PT SHORT TERM GOAL #3   Title ambulate with straight cane on even surfaces ( 04/18/15)   Time 4   Period Weeks   Status New   PT SHORT TERM GOAL #4   Title improve FOTO =/< 60% limited ( 04/18/15)   Time 4   Period Weeks   Status New   PT SHORT TERM GOAL #5   Title report pain decrease to allow him to only need OTC medication ( 04/18/15)    Time 4   Period Weeks   Status New           PT Long Term Goals - 03/21/15 1603    PT LONG TERM GOAL #1   Title I with advanced HEP ( 05/16/15)    Time 8   Period Weeks   Status New   PT  LONG TERM GOAL #2   Title demo Rt LE strength =/> 5-/5 ( 05/16/15)    Time 8   Period Weeks   Status New   PT LONG TERM GOAL #3   Title ambulate without an assistive device on even and uneven surfaces ( 05/16/15)    Time 8   Period Weeks   Status New   PT LONG TERM GOAL #4   Title improve FOTO =/< 54% limited ( 05/16/15)    Time 8   Period Weeks   Status New   PT LONG TERM GOAL #5   Title perform lifitng activites to simulate work per MD restriction for lifting ( 05/16/15)    Time 8   Period Weeks   Status New   Additional Long Term Goals   Additional Long Term Goals Yes   PT LONG TERM GOAL #6   Title climb on /off back of trailer without difficulty to simulate work ( 05/16/15)    Time 8   Period Weeks   Status New               Plan - 04/18/15 1605    Clinical Impression Statement Pt with increased difficulty to complete similar exercises as last session.  Pt continues with difficulty in WB activities and gait.  Pt demo decrease in Rt hip strength since last visit. Slow progress towards goals.    Pt will benefit from skilled therapeutic intervention in order to improve on the following deficits Abnormal gait;Decreased range of motion;Increased muscle spasms;Pain;Decreased balance;Decreased mobility;Decreased strength;Increased edema   Rehab Potential Good   Clinical Impairments Affecting Rehab Potential previous injuries to the Rt femur   PT Frequency 3x / week   PT Duration 4 weeks   PT Treatment/Interventions Moist Heat;Ultrasound;Therapeutic exercise;Taping;Vasopneumatic Device;Manual techniques;Balance training;DME Instruction;Neuromuscular re-education;Gait training;Cryotherapy;Electrical Stimulation;Stair training;Patient/family education;Scar mobilization   PT Next Visit Plan continue progressive strengthening/stretching to Rt hip, gait trials. Write MD note; returns to MD on 8/23.        Problem List Patient Active Problem List   Diagnosis Date Noted  . COPD,  moderate 01/16/2015  . Occult blood positive stool 01/31/2014  . 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 Testicular mass 08/18/2012  . Hypertension 08/18/2012  .  Smoker 08/18/2012  . Preventive measure 08/18/2012    Mayer Camel, PTA 04/18/2015 4:22 PM  [facility] [place] [address] [place] [address] [place], [place], [address] Phone: [phone]   Fax:  [phone]

## 2015-04-21 ENCOUNTER — Ambulatory Visit (INDEPENDENT_AMBULATORY_CARE_PROVIDER_SITE_OTHER): Payer: Commercial Managed Care - PPO | Admitting: Physical Therapy

## 2015-04-21 DIAGNOSIS — M25651 Stiffness of right hip, not elsewhere classified: Secondary | ICD-10-CM | POA: Diagnosis not present

## 2015-04-21 DIAGNOSIS — R609 Edema, unspecified: Secondary | ICD-10-CM | POA: Diagnosis not present

## 2015-04-21 DIAGNOSIS — R531 Weakness: Secondary | ICD-10-CM | POA: Diagnosis not present

## 2015-04-21 DIAGNOSIS — R269 Unspecified abnormalities of gait and mobility: Secondary | ICD-10-CM | POA: Diagnosis not present

## 2015-04-21 NOTE — Therapy (Signed)
[facility] [place] [address] [place] [address] [place], [place], [address] Phone: [phone]   Fax:  [phone]  Physical Therapy Treatment  Patient Details  Name: Christopher Robertson MRN: [id number] Date of Birth: 1951/01/20 Referring Provider:  Rodolph Bong, MD  Encounter Date: 04/21/2015      PT End of Session - 04/21/15 1440    Visit Number 11   Number of Visits 20   Date for PT Re-Evaluation 05/16/15   PT Start Time 1430   PT Stop Time 1517   PT Time Calculation (min) 47 min   Activity Tolerance Patient limited by pain;Patient limited by fatigue      Past Medical History  Diagnosis Date  . Hypertension     Past Surgical History  Procedure Laterality Date  . Knee surgery  1980 &2002  . Femur fracture surgery  1985  . Total hip arthroplasty Right     There were no vitals filed for this visit.  Visit Diagnosis:  Weakness generalized  Stiffness of hip joint, right  Abnormality of gait  Edema      Subjective Assessment - 04/21/15 1440    Subjective Pt reports he feels like he is getting a little more use in his Rt hip again.  The last visit helped a lot.  Pt felt benefit in estim treatment; ordered a TENS unit this wkend.    Currently in Pain? Yes   Pain Score 2    Pain Location Groin   Pain Orientation Right   Pain Descriptors / Indicators Pounding;Tightness;Sharp  "tentacle feeling"    Aggravating Factors  weight bearing    Pain Relieving Factors medicine, ice.             [facility] PT Assessment - 04/21/15 0001    Assessment   Medical Diagnosis Rt THA   Onset Date/Surgical Date 02/28/15   Next MD Visit 04/22/15   Prior Therapy HHPT in [place]   Strength   Strength Assessment Site Hip   Right/Left Hip Right   Right Hip Flexion 4/5   Right Hip Extension 3+/5   Right Hip ABduction --  5-/5                     [facility] Adult PT Treatment/Exercise - 04/21/15 0001    Knee/Hip Exercises:  Stretches   Lobbyist Right;30 seconds;3 reps   Hip Flexor Stretch Right;3 reps;20 seconds  (Standing with UE support)   Other Knee/Hip Stretches Hip adductor stretch (frog) x 30 sec x 2 reps    Knee/Hip Exercises: Aerobic   Nustep L4: 7 min    Knee/Hip Exercises: Standing   Heel Raises Both;1 set;10 reps   Hip Flexion Right;1 set;5 reps   Hip Flexion Limitations VC to increase hip flexion (with knee flexion)    Forward Step Up Right;2 sets;5 reps;Hand Hold: 2  3" step    Forward Step Up Limitations very challenging   Other Standing Knee Exercises Standing weight  shifts Rt/Lt with UE support x 10    Knee/Hip Exercises: Seated   Long Arc Quad Strengthening;Right;2 sets;10 reps  seated (elevated) on high low table   Long Arc Quad Weight 4 lbs.   Sit to Sand --  6 reps without UE support. Very challenging.    Knee/Hip Exercises: Prone   Hamstring Curl 2 sets;10 reps  4# on ankle   Modalities   Modalities Cryotherapy   Cryotherapy   Number Minutes Cryotherapy 15 Minutes   Cryotherapy  Location Hip   Type of Cryotherapy Ice pack   Electrical Stimulation   Electrical Stimulation Location Rt quad and groin   Electrical Stimulation Action IFC   Electrical Stimulation Parameters to tolerance x 15 min    Electrical Stimulation Goals Pain                  PT Short Term Goals - 03/21/15 1601    PT SHORT TERM GOAL #1   Title I with initial HEP ( 04/18/15)   Time 4   Period Weeks   Status New   PT SHORT TERM GOAL #2   Title increase strength Rt hip =/> 4/5 ( 04/18/15)   Time 4   Period Weeks   Status New   PT SHORT TERM GOAL #3   Title ambulate with straight cane on even surfaces ( 04/18/15)   Time 4   Period Weeks   Status New   PT SHORT TERM GOAL #4   Title improve FOTO =/< 60% limited ( 04/18/15)   Time 4   Period Weeks   Status New   PT SHORT TERM GOAL #5   Title report pain decrease to allow him to only need OTC medication ( 04/18/15)    Time 4   Period  Weeks   Status New           PT Long Term Goals - 03/21/15 1603    PT LONG TERM GOAL #1   Title I with advanced HEP ( 05/16/15)    Time 8   Period Weeks   Status New   PT LONG TERM GOAL #2   Title demo Rt LE strength =/> 5-/5 ( 05/16/15)    Time 8   Period Weeks   Status New   PT LONG TERM GOAL #3   Title ambulate without an assistive device on even and uneven surfaces ( 05/16/15)    Time 8   Period Weeks   Status New   PT LONG TERM GOAL #4   Title improve FOTO =/< 54% limited ( 05/16/15)    Time 8   Period Weeks   Status New   PT LONG TERM GOAL #5   Title perform lifitng activites to simulate work per MD restriction for lifting ( 05/16/15)    Time 8   Period Weeks   Status New   Additional Long Term Goals   Additional Long Term Goals Yes   PT LONG TERM GOAL #6   Title climb on /off back of trailer without difficulty to simulate work ( 05/16/15)    Time 8   Period Weeks   Status New               Plan - 04/21/15 1538    Clinical Impression Statement Pt demo improved Rt hip strength from previous visit, though continues with RLE weakness and difficulty WB activities.  Pain is a limiting barrier. Slowly progressing towards goals.    Pt will benefit from skilled therapeutic intervention in order to improve on the following deficits Abnormal gait;Decreased range of motion;Increased muscle spasms;Pain;Decreased balance;Decreased mobility;Decreased strength;Increased edema   Rehab Potential Good   PT Frequency 3x / week   PT Duration 4 weeks   PT Treatment/Interventions Moist Heat;Ultrasound;Therapeutic exercise;Taping;Vasopneumatic Device;Manual techniques;Balance training;DME Instruction;Neuromuscular re-education;Gait training;Cryotherapy;Electrical Stimulation;Stair training;Patient/family education;Scar mobilization   PT Next Visit Plan continue progressive strengthening/stretching to Rt hip, gait trials.         Problem List Patient Active Problem List  Diagnosis Date Noted  . 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 Occult blood positive stool 01/31/2014  . 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 Testicular mass 08/18/2012  . Hypertension 08/18/2012  . Smoker 08/18/2012  . Preventive measure 08/18/2012    Mayer Camel, PTA 04/21/2015 3:42 PM  [facility] [place] [address] [place] [address] [place], [place], [address] Phone: [phone]   Fax:  [phone]

## 2015-04-22 ENCOUNTER — Encounter: Payer: Self-pay | Admitting: Sports Medicine

## 2015-04-22 ENCOUNTER — Ambulatory Visit (INDEPENDENT_AMBULATORY_CARE_PROVIDER_SITE_OTHER): Payer: Commercial Managed Care - PPO | Admitting: Sports Medicine

## 2015-04-22 ENCOUNTER — Other Ambulatory Visit: Payer: Self-pay | Admitting: Sports Medicine

## 2015-04-22 ENCOUNTER — Ambulatory Visit (INDEPENDENT_AMBULATORY_CARE_PROVIDER_SITE_OTHER): Payer: Commercial Managed Care - PPO

## 2015-04-22 VITALS — BP 144/73 | HR 108 | Ht 74.0 in | Wt 274.0 lb

## 2015-04-22 DIAGNOSIS — Z9660 Presence of unspecified orthopedic joint implant: Secondary | ICD-10-CM

## 2015-04-22 DIAGNOSIS — Z96649 Presence of unspecified artificial hip joint: Secondary | ICD-10-CM

## 2015-04-22 DIAGNOSIS — G4700 Insomnia, unspecified: Secondary | ICD-10-CM | POA: Insufficient documentation

## 2015-04-22 DIAGNOSIS — I739 Peripheral vascular disease, unspecified: Secondary | ICD-10-CM | POA: Diagnosis not present

## 2015-04-22 DIAGNOSIS — M25051 Hemarthrosis, right hip: Secondary | ICD-10-CM | POA: Diagnosis not present

## 2015-04-22 MED ORDER — SUVOREXANT 10 MG PO TABS: 1.0000 | ORAL_TABLET | Freq: Every day | ORAL | Status: DC

## 2015-04-22 MED ORDER — OXYCODONE-ACETAMINOPHEN 10-325 MG PO TABS: 1.0000 | ORAL_TABLET | Freq: Three times a day (TID) | ORAL | Status: DC | PRN

## 2015-04-22 NOTE — Assessment & Plan Note (Signed)
Still having some pain as expected. Continue physical therapy, we are going to get some x-rays to ensure no loosening of the prosthesis. Refilling Percocet, #90 per month, every month we will decrease by 30.

## 2015-04-22 NOTE — Progress Notes (Signed)
  Subjective:    CC: Follow-up  HPI: Christopher Robertson is one month post right total hip arthroplasty, still having some pain as expected, he does need some more forms filled out. Also having great difficulty sleeping  Past medical history, Surgical history, Family history not pertinant except as noted below, Social history, Allergies, and medications have been entered into the medical record, reviewed, and no changes needed.   Review of Systems: No fevers, chills, night sweats, weight loss, chest pain, or shortness of breath.   Objective:    General: Well Developed, well nourished, and in no acute distress.  Neuro: Alert and oriented x3, extra-ocular muscles intact, sensation grossly intact.  HEENT: Normocephalic, atraumatic, pupils equal round reactive to light, neck supple, no masses, no lymphadenopathy, thyroid nonpalpable.  Skin: Warm and dry, no rashes. Cardiac: Regular rate and rhythm, no murmurs rubs or gallops, no lower extremity edema.  Respiratory: Clear to auscultation bilaterally. Not using accessory muscles, speaking in full sentences.  Procedure: Diagnostic Ultrasound of right hip  Device: GE Logiq E  Findings:Minimal hypoechoic change around the arthroplasty suggestive of mild hemarthrosis.  Images permanently stored and available for review in the ultrasound unit.  Impression: Mild hemarthrosis with stable arthroplasty  Impression and Recommendations:    I spent 25 minutes with this patient, greater than 50% was face-to-face time counseling regarding the above diagnoses

## 2015-04-22 NOTE — Assessment & Plan Note (Signed)
Belsomra trial.

## 2015-04-23 ENCOUNTER — Ambulatory Visit (INDEPENDENT_AMBULATORY_CARE_PROVIDER_SITE_OTHER): Payer: Commercial Managed Care - PPO | Admitting: Physical Therapy

## 2015-04-23 DIAGNOSIS — R269 Unspecified abnormalities of gait and mobility: Secondary | ICD-10-CM | POA: Diagnosis not present

## 2015-04-23 DIAGNOSIS — R531 Weakness: Secondary | ICD-10-CM | POA: Diagnosis not present

## 2015-04-23 DIAGNOSIS — R609 Edema, unspecified: Secondary | ICD-10-CM

## 2015-04-23 DIAGNOSIS — M25651 Stiffness of right hip, not elsewhere classified: Secondary | ICD-10-CM | POA: Diagnosis not present

## 2015-04-23 NOTE — Therapy (Signed)
[facility] [place] [address] [place] [address] [place], [place], [address] Phone: [phone]   Fax:  [phone]  Physical Therapy Treatment  Patient Details  Name: Christopher Robertson MRN: [id number] Date of Birth: 1951-07-01 Referring Provider:  Rodolph Bong, MD  Encounter Date: 04/23/2015      PT End of Session - 04/23/15 1446    Visit Number 12   Number of Visits 20   Date for PT Re-Evaluation 05/16/15   PT Start Time 1432   PT Stop Time 1531   PT Time Calculation (min) 59 min   Activity Tolerance Patient limited by fatigue;Patient limited by pain      Past Medical History  Diagnosis Date  . Hypertension     Past Surgical History  Procedure Laterality Date  . Knee surgery  1980 &2002  . Femur fracture surgery  1985  . Total hip arthroplasty Right     There were no vitals filed for this visit.  Visit Diagnosis:  Weakness generalized  Stiffness of hip joint, right  Abnormality of gait  Edema      Subjective Assessment - 04/23/15 1501    Subjective Pt's estim unit arrived but didn't work so he sent it back. Pt feels he is improved about 30-40% since beginning therapy.    Currently in Pain? Yes   Pain Score 3    Pain Location Groin   Pain Orientation Right   Pain Descriptors / Indicators Tightness;Sore   Aggravating Factors  weight bearing   Pain Relieving Factors medicine, ice             [facility] PT Assessment - 04/23/15 0001    Assessment   Medical Diagnosis Rt THA   Onset Date/Surgical Date 02/28/15   Next MD Visit 05/22/15   Prior Therapy HHPT in [place]   Precautions   Precautions Anterior Hip          [facility] Adult PT Treatment/Exercise - 04/23/15 0001    Ambulation/Gait   Ambulation/Gait Yes   Ambulation/Gait Assistance 4: Min assist   Assistive device Straight cane;1 person hand held assist   Gait Pattern Step-through pattern;Decreased hip/knee flexion - right;Decreased dorsiflexion -  right;Decreased weight shift to right.  4 trials of ~25 ft     Knee/Hip Exercises: Stretches   Lobbyist Right;3 reps;60 seconds   Hip Flexor Stretch Right;2 reps;30 seconds  standing with UE support    Knee/Hip Exercises: Aerobic   Nustep L6: 7 min    Knee/Hip Exercises: Standing   Heel Raises Both;1 set;10 reps   Lateral Step Up Right;2 sets;5 reps;Hand Hold: 2  3" step, VC for upright posture   Lateral Step Up Limitations very challenging; some cramping in Rt lateral hip   Forward Step Up Right;1 set;10 reps;Hand Hold: 2  3" step    Other Standing Knee Exercises Standing weight  shifts Rt/Lt with UE support x 20  (standing on blue foam); front to back weight shifts on blue pad x 10 reps.   side stepping 2 ft Rt/Lt x 4 reps with heavy UE support.    Modalities   Modalities Electrical Stimulation;Cryotherapy   Cryotherapy   Number Minutes Cryotherapy 15 Minutes   Cryotherapy Location Hip   Type of Cryotherapy Ice pack   Electrical Stimulation   Electrical Stimulation Location Rt quad and groin   Electrical Stimulation Action IFC   Electrical Stimulation Parameters to tolerance x 15    Electrical Stimulation Goals Pain  PT Short Term Goals - 03/21/15 1601    PT SHORT TERM GOAL #1   Title I with initial HEP ( 04/18/15)   Time 4   Period Weeks   Status New   PT SHORT TERM GOAL #2   Title increase strength Rt hip =/> 4/5 ( 04/18/15)   Time 4   Period Weeks   Status New   PT SHORT TERM GOAL #3   Title ambulate with straight cane on even surfaces ( 04/18/15)   Time 4   Period Weeks   Status New   PT SHORT TERM GOAL #4   Title improve FOTO =/< 60% limited ( 04/18/15)   Time 4   Period Weeks   Status New   PT SHORT TERM GOAL #5   Title report pain decrease to allow him to only need OTC medication ( 04/18/15)    Time 4   Period Weeks   Status New           PT Long Term Goals - 03/21/15 1603    PT LONG TERM GOAL #1   Title I with advanced  HEP ( 05/16/15)    Time 8   Period Weeks   Status New   PT LONG TERM GOAL #2   Title demo Rt LE strength =/> 5-/5 ( 05/16/15)    Time 8   Period Weeks   Status New   PT LONG TERM GOAL #3   Title ambulate without an assistive device on even and uneven surfaces ( 05/16/15)    Time 8   Period Weeks   Status New   PT LONG TERM GOAL #4   Title improve FOTO =/< 54% limited ( 05/16/15)    Time 8   Period Weeks   Status New   PT LONG TERM GOAL #5   Title perform lifitng activites to simulate work per MD restriction for lifting ( 05/16/15)    Time 8   Period Weeks   Status New   Additional Long Term Goals   Additional Long Term Goals Yes   PT LONG TERM GOAL #6   Title climb on /off back of trailer without difficulty to simulate work ( 05/16/15)    Time 8   Period Weeks   Status New               Plan - 04/23/15 1516    Clinical Impression Statement Pt able to tolerate 4 short gait trials of 25 ft with min A (HHA) and SPC.  Pain continues to be limiting barrier. Pt able to tolerate 10 reps of forward step ups on 3" step prior to needing a rest break.  Slowly progressing towards goals.    Pt will benefit from skilled therapeutic intervention in order to improve on the following deficits Abnormal gait;Decreased range of motion;Increased muscle spasms;Pain;Decreased balance;Decreased mobility;Decreased strength;Increased edema   Rehab Potential Good   Clinical Impairments Affecting Rehab Potential previous injuries to the Rt femur   PT Frequency 3x / week   PT Duration 4 weeks   PT Treatment/Interventions Moist Heat;Ultrasound;Therapeutic exercise;Taping;Vasopneumatic Device;Manual techniques;Balance training;DME Instruction;Neuromuscular re-education;Gait training;Cryotherapy;Electrical Stimulation;Stair training;Patient/family education;Scar mobilization   PT Next Visit Plan continue progressive strengthening/stretching to Rt hip, gait trials.    Consulted and Agree with Plan of Care  Patient        Problem List Patient Active Problem List   Diagnosis Date Noted  . Insomnia 04/22/2015  . 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 Occult blood positive stool 01/31/2014  .  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 Testicular mass 08/18/2012  . Hypertension 08/18/2012  . Smoker 08/18/2012  . Preventive measure 08/18/2012   Mayer Camel, PTA 04/23/2015 3:23 PM  [facility] [place] [address] [place] [address] [place], [place], [address] Phone: [phone]   Fax:  [phone]

## 2015-04-25 ENCOUNTER — Ambulatory Visit (INDEPENDENT_AMBULATORY_CARE_PROVIDER_SITE_OTHER): Payer: Commercial Managed Care - PPO | Admitting: Physical Therapy

## 2015-04-25 DIAGNOSIS — M25651 Stiffness of right hip, not elsewhere classified: Secondary | ICD-10-CM | POA: Diagnosis not present

## 2015-04-25 DIAGNOSIS — R531 Weakness: Secondary | ICD-10-CM

## 2015-04-25 DIAGNOSIS — R269 Unspecified abnormalities of gait and mobility: Secondary | ICD-10-CM | POA: Diagnosis not present

## 2015-04-25 DIAGNOSIS — R609 Edema, unspecified: Secondary | ICD-10-CM

## 2015-04-25 NOTE — Therapy (Signed)
[facility] [place] [address] [place], [place], [address] Phone: [phone]   Fax:  [phone]  Physical Therapy Treatment  Patient Details  Name: Christopher Robertson MRN: [id number] Date of Birth: 1951/04/11 Referring Provider:  Rodolph Bong, MD  Encounter Date: 04/25/2015      PT End of Session - 04/25/15 1502    Visit Number 13   Number of Visits 20   Date for PT Re-Evaluation 05/16/15   PT Start Time 1446   PT Stop Time 1545   PT Time Calculation (min) 59 min   Activity Tolerance No increased pain;Patient limited by fatigue      Past Medical History  Diagnosis Date  . Hypertension     Past Surgical History  Procedure Laterality Date  . Knee surgery  1980 &2002  . Femur fracture surgery  1985  . Total hip arthroplasty Right     There were no vitals filed for this visit.  Visit Diagnosis:  Weakness generalized  Stiffness of hip joint, right  Abnormality of gait  Edema      Subjective Assessment - 04/25/15 1500    Subjective Pt reports it takes 2 days to get RLE to "settles down" after gait training sessions.  Otherwise, nothing new to report.    Currently in Pain? Yes   Pain Score 3    Pain Location Groin   Pain Orientation Right   Pain Descriptors / Indicators Tightness;Sore   Aggravating Factors  weight bearing    Pain Relieving Factors medicine, ice             [facility] PT Assessment - 04/25/15 0001    Assessment   Medical Diagnosis Rt THA   Onset Date/Surgical Date 02/28/15   Next MD Visit 05/22/15   Prior Therapy HHPT in [place]            [facility] Adult PT Treatment/Exercise - 04/25/15 0001    Ambulation/Gait   Ambulation/Gait Yes   Ambulation/Gait Assistance 4: Min assist   Ambulation Distance (Feet) --  multiple trials, longest one ~75 ft.    Assistive device Straight cane;1 person hand held assist   Gait Pattern Step-through pattern;Decreased hip/knee flexion -  right;Decreased dorsiflexion - right;Decreased weight shift to right   Gait Comments Educated pt's daughter on how to provide HHA to pt. Pt able to return demo with pt ~12 ft with good support. Pt's gait quality slowly improving with slight increase in Rt step length with cues and slight increase in Rt knee flexion during swing through.    Knee/Hip Exercises: Stretches   Hip Flexor Stretch Right;30 seconds;4 reps  standing with UE support    Knee/Hip Exercises: Aerobic   Nustep L5: 7 min    Knee/Hip Exercises: Machines for Strengthening   Cybex Knee Extension RLE only: 1 plate x 10 reps x 2 sets, 7 reps last set (fatigued)   Cybex Leg Press 3 plates: RLE x 10 reps x 2 sets   Knee/Hip Exercises: Standing   Heel Raises Both;2 sets;10 reps   Forward Step Up 2 sets;5 reps;Right  3"   Other Standing Knee Exercises Standing weight  shifts Rt/Lt with UE support x 20  (standing on blue foam); front to back weight shifts on blue pad x 10 reps.      Modalities   Modalities Electrical Stimulation;Cryotherapy   Cryotherapy   Number Minutes Cryotherapy 15 Minutes   Cryotherapy Location Hip   Type of Cryotherapy Ice pack  Programme researcher, broadcasting/film/video Location Rt quad and lateral hip    Electrical Stimulation Action IFC   Electrical Stimulation Parameters to tolerance x 15 min    Electrical Stimulation Goals Pain                PT Education - 04/25/15 1617    Education provided Yes   Education Details Pt to perform short gait trials with daughter providing HHA and use of SPC.    Person(s) Educated Patient;Child(ren)   Methods Explanation;Demonstration   Comprehension Verbalized understanding;Returned demonstration          PT Short Term Goals - 03/21/15 1601    PT SHORT TERM GOAL #1   Title I with initial HEP ( 04/18/15)   Time 4   Period Weeks   Status New   PT SHORT TERM GOAL #2   Title increase strength Rt hip =/> 4/5 ( 04/18/15)   Time 4   Period Weeks    Status New   PT SHORT TERM GOAL #3   Title ambulate with straight cane on even surfaces ( 04/18/15)   Time 4   Period Weeks   Status New   PT SHORT TERM GOAL #4   Title improve FOTO =/< 60% limited ( 04/18/15)   Time 4   Period Weeks   Status New   PT SHORT TERM GOAL #5   Title report pain decrease to allow him to only need OTC medication ( 04/18/15)    Time 4   Period Weeks   Status New           PT Long Term Goals - 03/21/15 1603    PT LONG TERM GOAL #1   Title I with advanced HEP ( 05/16/15)    Time 8   Period Weeks   Status New   PT LONG TERM GOAL #2   Title demo Rt LE strength =/> 5-/5 ( 05/16/15)    Time 8   Period Weeks   Status New   PT LONG TERM GOAL #3   Title ambulate without an assistive device on even and uneven surfaces ( 05/16/15)    Time 8   Period Weeks   Status New   PT LONG TERM GOAL #4   Title improve FOTO =/< 54% limited ( 05/16/15)    Time 8   Period Weeks   Status New   PT LONG TERM GOAL #5   Title perform lifitng activites to simulate work per MD restriction for lifting ( 05/16/15)    Time 8   Period Weeks   Status New   Additional Long Term Goals   Additional Long Term Goals Yes   PT LONG TERM GOAL #6   Title climb on /off back of trailer without difficulty to simulate work ( 05/16/15)    Time 8   Period Weeks   Status New               Plan - 04/25/15 1615    Clinical Impression Statement Pt able to tolerate increased distance of gait trials, up to ~75 ft with min A (HHA) and SPC. Pt tolerated new resistance exercises with minimal increase in pain. Continues to progress slowly towards existing goals.    Pt will benefit from skilled therapeutic intervention in order to improve on the following deficits Abnormal gait;Decreased range of motion;Increased muscle spasms;Pain;Decreased balance;Decreased mobility;Decreased strength;Increased edema   Rehab Potential Good   Clinical Impairments Affecting Rehab Potential previous injuries  to the Rt femur   PT Frequency 3x / week   PT Duration 4 weeks   PT Treatment/Interventions Moist Heat;Ultrasound;Therapeutic exercise;Taping;Vasopneumatic Device;Manual techniques;Balance training;DME Instruction;Neuromuscular re-education;Gait training;Cryotherapy;Electrical Stimulation;Stair training;Patient/family education;Scar mobilization   PT Next Visit Plan continue progressive strengthening/stretching to Rt hip, gait trials.    Consulted and Agree with Plan of Care Patient;Family member/caregiver   Family Member Consulted pt's daughter        Problem List Patient Active Problem List   Diagnosis Date Noted  . Insomnia 04/22/2015  . 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 Occult blood positive stool 01/31/2014  . 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 Testicular mass 08/18/2012  . Hypertension 08/18/2012  . Smoker 08/18/2012  . Preventive measure 08/18/2012    Mayer Camel, PTA 04/25/2015 4:18 PM  [facility] [place] [address] [place] [address] [place], [place], [address] Phone: [phone]   Fax:  [phone]

## 2015-04-28 ENCOUNTER — Ambulatory Visit (INDEPENDENT_AMBULATORY_CARE_PROVIDER_SITE_OTHER): Payer: Commercial Managed Care - PPO | Admitting: Physical Therapy

## 2015-04-28 DIAGNOSIS — M25651 Stiffness of right hip, not elsewhere classified: Secondary | ICD-10-CM

## 2015-04-28 DIAGNOSIS — R531 Weakness: Secondary | ICD-10-CM

## 2015-04-28 DIAGNOSIS — R609 Edema, unspecified: Secondary | ICD-10-CM | POA: Diagnosis not present

## 2015-04-28 DIAGNOSIS — R269 Unspecified abnormalities of gait and mobility: Secondary | ICD-10-CM

## 2015-04-28 NOTE — Therapy (Signed)
[place], [place], [address] Phone: [phone]   Fax:  [phone]  Physical Therapy Treatment  Patient Details  Name: Christopher Robertson MRN: [id number] Date of Birth: 1950-10-07 Referring Provider:  Gregor Hams, MD  Encounter Date: 04/28/2015      PT End of Session - 04/28/15 1448    Visit Number 14   Number of Visits 20   Date for PT Re-Evaluation 05/16/15   PT Start Time 3300   PT Stop Time 7622   PT Time Calculation (min) 70 min   Activity Tolerance Patient limited by fatigue      Past Medical History  Diagnosis Date  . Hypertension     Past Surgical History  Procedure Laterality Date  . Knee surgery  1980 &2002  . Femur fracture surgery  1985  . Total hip arthroplasty Right     There were no vitals filed for this visit.  Visit Diagnosis:  Weakness generalized  Stiffness of hip joint, right  Abnormality of gait  Edema      Subjective Assessment - 04/28/15 1448    Subjective Pt reports he had to take daughter to ER again this wkend, but spent more time walking so he wouldn't be so stiff.  Nothing new to report.    Currently in Pain? Yes   Pain Score 4    Pain Location Groin   Pain Orientation Right   Pain Descriptors / Indicators Tightness   Aggravating Factors  weight bearing   Pain Relieving Factors medicine, ice             [facility] PT Assessment - 04/28/15 0001    Assessment   Medical Diagnosis Rt THA   Onset Date/Surgical Date 02/28/15   Next MD Visit 05/22/15   Prior Therapy HHPT in [place]   Precautions   Precautions Anterior Hip   Strength   Strength Assessment Site Hip   Right/Left Hip Right   Right Hip Flexion 4-/5   Right Hip Extension 4/5   Right Hip ABduction --  5-/5   Right Hip ADduction 4/5                     [facility] Adult PT Treatment/Exercise - 04/28/15 0001    Ambulation/Gait   Ambulation/Gait Yes   Ambulation/Gait  Assistance 4: Min assist;3: Mod assist   Ambulation Distance (Feet) --  multiple gait trials, longest one ~100 ft.    Assistive device Straight cane;1 person hand held assist   Gait Pattern Step-through pattern;Decreased hip/knee flexion - right;Decreased dorsiflexion - right;Decreased weight shift to right   Knee/Hip Exercises: Stretches   Sports administrator Right;3 reps;60 seconds  with pillow at knee, with strap    Hip Flexor Stretch Right;30 seconds;4 reps  standing with UE support    Gastroc Stretch Right;Left;2 reps;20 seconds   Other Knee/Hip Stretches Hip adductor stretch (frog) x 30 sec x 2 reps    Knee/Hip Exercises: Aerobic   Nustep L5: 7 min    Knee/Hip Exercises: Machines for Strengthening   Cybex Knee Extension RLE only with 1 plate x 10 reps; BLE with 2 plates to extension and RLE eccentric control into flexion    Cybex Leg Press 3 plates: RLE x 10,  4 plates x 10 reps   Knee/Hip Exercises: Standing   Heel Raises Both;2 sets;10 reps   Other Standing Knee Exercises Side stepping Lt to/from Rt 4 ft x 4 reps with increased step  height.    Knee/Hip Exercises: Supine   Bridges Limitations x 10 reps    Modalities   Modalities Electrical Stimulation;Cryotherapy   Cryotherapy   Number Minutes Cryotherapy 15 Minutes   Cryotherapy Location Hip   Type of Cryotherapy Ice pack   Electrical Stimulation   Electrical Stimulation Location Rt quad and lateral thigh   Electrical Stimulation Action IFC    Electrical Stimulation Parameters to tolerance, 15 min   Electrical Stimulation Goals Pain                  PT Short Term Goals - 04/28/15 1518    PT SHORT TERM GOAL #1   Title I with initial HEP ( 04/18/15)   Time 4   Period Weeks   Status Achieved   PT SHORT TERM GOAL #2   Title increase strength Rt hip =/> 4/5 ( 04/18/15)   Time 4   Period Weeks   Status Achieved   PT SHORT TERM GOAL #3   Title ambulate with straight cane on even surfaces independently ( 0916/16)    Time 7   Period Weeks   Status Revised   PT SHORT TERM GOAL #4   Title improve FOTO =/< 60% limited ( 04/18/15)   Time 7   Period Weeks   Status On-going   PT SHORT TERM GOAL #5   Title report pain decrease to allow him to only need OTC medication ( 04/18/15)    Time 4   Period Weeks   Status On-going           PT Long Term Goals - 04/28/15 1521    PT LONG TERM GOAL #1   Title I with advanced HEP ( 05/16/15)    Time 8   Period Weeks   Status On-going   PT LONG TERM GOAL #2   Title demo Rt LE strength =/> 5-/5 ( 05/16/15)    Time 8   Period Weeks   Status On-going   PT LONG TERM GOAL #3   Title ambulate without an assistive device on even and uneven surfaces ( 05/16/15)    Time 8   Period Weeks   Status On-going   PT LONG TERM GOAL #4   Title improve FOTO =/< 54% limited ( 05/16/15)    Time 8   Period Weeks   PT LONG TERM GOAL #5   Title perform lifitng activites to simulate work per MD restriction for lifting ( 05/16/15)    Time 8   Period Weeks   Status On-going   PT LONG TERM GOAL #6   Title climb on /off back of trailer without difficulty to simulate work ( 05/16/15)    Time 8   Period Weeks   Status On-going               Plan - 04/28/15 1545    Clinical Impression Statement Pt demo improved Rt hip strength for MMT, but still struggles with SLR and functional activities like sit to stand and gait.  Pt tolerated increased gait distance with SPC, however still requires min-mod HHA as well.  Responds well to verbal/tactile cues for form.  Has met STG #1 and 2; STG 3 has been revised.   Pt will benefit from skilled therapeutic intervention in order to improve on the following deficits Abnormal gait;Decreased range of motion;Increased muscle spasms;Pain;Decreased balance;Decreased mobility;Decreased strength;Increased edema   Rehab Potential Good   Clinical Impairments Affecting Rehab Potential previous injuries to the Rt femur  PT Frequency 3x / week   PT  Duration 4 weeks   PT Treatment/Interventions Moist Heat;Ultrasound;Therapeutic exercise;Taping;Vasopneumatic Device;Manual techniques;Balance training;DME Instruction;Neuromuscular re-education;Gait training;Cryotherapy;Electrical Stimulation;Stair training;Patient/family education;Scar mobilization   PT Next Visit Plan continue progressive strengthening/stretching to Rt hip, gait trials.    Consulted and Agree with Plan of Care Patient        Problem List Patient Active Problem List   Diagnosis Date Noted  . Insomnia 04/22/2015  . 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 Occult blood positive stool 01/31/2014  . 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 Testicular mass 08/18/2012  . Hypertension 08/18/2012  . Smoker 08/18/2012  . Preventive measure 08/18/2012   Kerin Perna, PTA 04/28/2015 4:10 PM  [facility] [place], [place], [address] Phone: [phone]   Fax:  [phone]

## 2015-04-28 NOTE — Therapy (Signed)
[facility] [place] [address] [place], [place], [address] Phone: [phone]   Fax:  [phone]  Physical Therapy Treatment  Patient Details  Name: Christopher Robertson MRN: [id number] Date of Birth: 01-May-1951 Referring Provider:  Rodolph Bong, MD  Encounter Date: 04/28/2015      PT End of Session - 04/28/15 1448    Visit Number 14   Number of Visits 20   Date for PT Re-Evaluation 05/16/15   PT Start Time 1444      Past Medical History  Diagnosis Date  . Hypertension     Past Surgical History  Procedure Laterality Date  . Knee surgery  1980 &2002  . Femur fracture surgery  1985  . Total hip arthroplasty Right     There were no vitals filed for this visit.  Visit Diagnosis:  No diagnosis found.      Subjective Assessment - 04/28/15 1448    Subjective Pt reports he had to take daughter to ER again this wkend, but spent more time walking so he wouldn't be so stiff.  Nothing new to report.    Currently in Pain? Yes   Pain Score 4    Pain Location Groin   Pain Orientation Right   Pain Descriptors / Indicators Tightness   Aggravating Factors  weight bearing   Pain Relieving Factors medicine, ice             [facility] PT Assessment - 04/28/15 0001    Assessment   Medical Diagnosis Rt THA   Onset Date/Surgical Date 02/28/15   Next MD Visit 05/22/15   Prior Therapy HHPT in [place]   Precautions   Precautions Anterior Hip   Strength   Strength Assessment Site Hip   Right/Left Hip Right   Right Hip Flexion 4-/5   Right Hip Extension 4/5   Right Hip ABduction --  5-/5   Right Hip ADduction 4/5                     [facility] Adult PT Treatment/Exercise - 04/28/15 0001    Knee/Hip Exercises: Machines for Strengthening   Cybex Knee Extension RLE only with 1 plate x 10 reps; BLE with 2 plates to extension and RLE eccentric control into flexion    Knee/Hip Exercises: Standing   Other Standing Knee  Exercises Side stepping Lt to/from Rt 4 ft x 4 reps with increased step height.    Knee/Hip Exercises: Supine   Bridges Limitations x 10 reps                   PT Short Term Goals - 04/28/15 1518    PT SHORT TERM GOAL #1   Title I with initial HEP ( 04/18/15)   Time 4   Period Weeks   Status Achieved   PT SHORT TERM GOAL #2   Title increase strength Rt hip =/> 4/5 ( 04/18/15)   Time 4   Period Weeks   Status Achieved   PT SHORT TERM GOAL #3   Title ambulate with straight cane on even surfaces independently ( 1324/40)   Time 7   Period Weeks   Status Revised   PT SHORT TERM GOAL #4   Title improve FOTO =/< 60% limited ( 04/18/15)   Time 7   Period Weeks   Status On-going   PT SHORT TERM GOAL #5   Title report pain decrease to allow him to only need OTC medication ( 04/18/15)  Time 4   Period Weeks   Status On-going           PT Long Term Goals - 04/28/15 1521    PT LONG TERM GOAL #1   Title I with advanced HEP ( 05/16/15)    Time 8   Period Weeks   Status On-going   PT LONG TERM GOAL #2   Title demo Rt LE strength =/> 5-/5 ( 05/16/15)    Time 8   Period Weeks   Status On-going   PT LONG TERM GOAL #3   Title ambulate without an assistive device on even and uneven surfaces ( 05/16/15)    Time 8   Period Weeks   Status On-going   PT LONG TERM GOAL #4   Title improve FOTO =/< 54% limited ( 05/16/15)    Time 8   Period Weeks   PT LONG TERM GOAL #5   Title perform lifitng activites to simulate work per MD restriction for lifting ( 05/16/15)    Time 8   Period Weeks   Status On-going   PT LONG TERM GOAL #6   Title climb on /off back of trailer without difficulty to simulate work ( 05/16/15)    Time 8   Period Weeks   Status On-going               Problem List Patient Active Problem List   Diagnosis Date Noted  . Insomnia 04/22/2015  . 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 Occult blood positive stool 01/31/2014  . S/P right total hip arthroplasty  12/03/2013  . Testicular mass 08/18/2012  . Hypertension 08/18/2012  . Smoker 08/18/2012  . Preventive measure 08/18/2012    Lurine Imel Rober Minion PT, MPH 04/28/2015, 3:23 PM  [facility] [address] [place], [place], [address] Phone: [phone]   Fax:  [phone]

## 2015-04-30 ENCOUNTER — Ambulatory Visit (INDEPENDENT_AMBULATORY_CARE_PROVIDER_SITE_OTHER): Payer: Commercial Managed Care - PPO | Admitting: Physical Therapy

## 2015-04-30 DIAGNOSIS — R269 Unspecified abnormalities of gait and mobility: Secondary | ICD-10-CM | POA: Diagnosis not present

## 2015-04-30 DIAGNOSIS — M25651 Stiffness of right hip, not elsewhere classified: Secondary | ICD-10-CM | POA: Diagnosis not present

## 2015-04-30 DIAGNOSIS — R531 Weakness: Secondary | ICD-10-CM | POA: Diagnosis not present

## 2015-04-30 DIAGNOSIS — R609 Edema, unspecified: Secondary | ICD-10-CM | POA: Diagnosis not present

## 2015-04-30 NOTE — Therapy (Signed)
[place], [place], [address] Phone: [phone]   Fax:  [phone]  Physical Therapy Treatment  Patient Details  Name: Christopher Robertson MRN: [id number] Date of Birth: 1950/09/21 Referring Provider:  Gregor Hams, MD  Encounter Date: 04/30/2015      PT End of Session - 04/30/15 1517    Visit Number 15   Number of Visits 20   Date for PT Re-Evaluation 05/16/15   PT Start Time 2992   PT Stop Time 1527   PT Time Calculation (min) 59 min      Past Medical History  Diagnosis Date  . Hypertension     Past Surgical History  Procedure Laterality Date  . Knee surgery  1980 &2002  . Femur fracture surgery  1985  . Total hip arthroplasty Right     There were no vitals filed for this visit.  Visit Diagnosis:  Weakness generalized  Stiffness of hip joint, right  Abnormality of gait  Edema      Subjective Assessment - 04/30/15 1430    Subjective Pt reports his TENS unit came in the mail and he used it last night.  Didn't need any pain medicine last night.    Currently in Pain? Yes   Pain Score 2    Pain Location Groin   Pain Orientation Right   Pain Descriptors / Indicators Tightness   Aggravating Factors  wieght bearing    Pain Relieving Factors medicine, ice, TENS use            [facility] PT Assessment - 04/30/15 0001    Assessment   Medical Diagnosis Rt THA   Onset Date/Surgical Date 02/28/15   Next MD Visit 05/22/15   Prior Therapy HHPT in [place] Adult PT Treatment/Exercise - 04/30/15 0001    Ambulation/Gait   Ambulation/Gait Yes   Ambulation/Gait Assistance 4: Min guard;6: Modified independent (Device/Increase time)   Ambulation Distance (Feet) --  80 ft trials x 4 reps; 50 ft x 1; Min A progress to Mod I   Assistive device Straight cane;1 person hand held assist   Gait Pattern Step-through pattern;Decreased hip/knee flexion -  right;Decreased dorsiflexion - right;Decreased weight shift to right   Knee/Hip Exercises: Public affairs consultant 3 reps;30 seconds  with strap    Hip Flexor Stretch Right;2 reps;60 seconds  standing with UE support   Gastroc Stretch Right;2 reps;30 seconds   Other Knee/Hip Stretches Hip adductor stretch standing x 30 sec x 2   Other Knee/Hip Stretches Prone MFR to Rt quad with ball held 1 min x 3 reps    Knee/Hip Exercises: Aerobic   Nustep L5: 6 min    Knee/Hip Exercises: Machines for Strengthening   Cybex Knee Extension 2 plates: BLE into extension, RLE controlled return to flexion x 10 reps   Knee/Hip Exercises: Standing   Hip Flexion Right;Left;2 sets;5 reps   Hip Flexion Limitations (high marching- challenging)   Modalities   Modalities Electrical Stimulation;Cryotherapy   Cryotherapy   Number Minutes Cryotherapy 15 Minutes   Cryotherapy Location Hip   Type of Cryotherapy Ice pack   Electrical Stimulation   Electrical Stimulation Location Rt proximal quad/ lateral thigh    Electrical Stimulation Action IFC   Electrical Stimulation Parameters to tolerance    Electrical Stimulation Goals Pain  PT Short Term Goals - 04/28/15 1518    PT SHORT TERM GOAL #1   Title I with initial HEP ( 04/18/15)   Time 4   Period Weeks   Status Achieved   PT SHORT TERM GOAL #2   Title increase strength Rt hip =/> 4/5 ( 04/18/15)   Time 4   Period Weeks   Status Achieved   PT SHORT TERM GOAL #3   Title ambulate with straight cane on even surfaces independently ( 1660/63)   Time 7   Period Weeks   Status Achieved   PT SHORT TERM GOAL #4   Title improve FOTO =/< 60% limited ( 04/18/15)   Time 7   Period Weeks   Status Achieved   PT SHORT TERM GOAL #5   Title report pain decrease to allow him to only need OTC medication ( 04/18/15)    Time 4   Period Weeks   Status On-going           PT Long Term Goals - 04/28/15 1521    PT LONG TERM GOAL #1   Title  I with advanced HEP ( 05/16/15)    Time 8   Period Weeks   Status On-going   PT LONG TERM GOAL #2   Title demo Rt LE strength =/> 5-/5 ( 05/16/15)    Time 8   Period Weeks   Status On-going   PT LONG TERM GOAL #3   Title ambulate without an assistive device on even and uneven surfaces ( 05/16/15)    Time 8   Period Weeks   Status On-going   PT LONG TERM GOAL #4   Title improve FOTO =/< 54% limited ( 05/16/15)    Time 8   Period Weeks   PT LONG TERM GOAL #5   Title perform lifitng activites to simulate work per MD restriction for lifting ( 05/16/15)    Time 8   Period Weeks   Status On-going   PT LONG TERM GOAL #6   Title climb on /off back of trailer without difficulty to simulate work ( 05/16/15)    Time 8   Period Weeks   Status On-going               Plan - 04/30/15 1510    Clinical Impression Statement Pt demo improved gait quality with repetition and VC - was able to ambulate with mod I with cane (no HHA required); met STG #3. Also met STG #4. Pt reported he had "instant relief" in Rt hip with MFR with ball.  Pt progressing towards goals.  Pt has insurance coverage concerns; will have to check into this prior to next appt.   Pt will benefit from skilled therapeutic intervention in order to improve on the following deficits Abnormal gait;Decreased range of motion;Increased muscle spasms;Pain;Decreased balance;Decreased mobility;Decreased strength;Increased edema   Rehab Potential Good   Clinical Impairments Affecting Rehab Potential previous injuries to the Rt femur   PT Frequency 3x / week   PT Duration 4 weeks   PT Treatment/Interventions Moist Heat;Ultrasound;Therapeutic exercise;Taping;Vasopneumatic Device;Manual techniques;Balance training;DME Instruction;Neuromuscular re-education;Gait training;Cryotherapy;Electrical Stimulation;Stair training;Patient/family education;Scar mobilization   PT Next Visit Plan continue progressive strengthening/stretching to Rt hip, gait  trials.    Consulted and Agree with Plan of Care Patient        Problem List Patient Active Problem List   Diagnosis Date Noted  . Insomnia 04/22/2015  . 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 Occult blood positive stool 01/31/2014  .  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 Testicular mass 08/18/2012  . Hypertension 08/18/2012  . Smoker 08/18/2012  . Preventive measure 08/18/2012   Kerin Perna, PTA 04/30/2015 3:18 PM  [place], [place], [address] Phone: [phone]   Fax:  [phone]

## 2015-05-02 ENCOUNTER — Ambulatory Visit (INDEPENDENT_AMBULATORY_CARE_PROVIDER_SITE_OTHER): Payer: Commercial Managed Care - PPO | Admitting: Physical Therapy

## 2015-05-02 DIAGNOSIS — R269 Unspecified abnormalities of gait and mobility: Secondary | ICD-10-CM | POA: Diagnosis not present

## 2015-05-02 DIAGNOSIS — R531 Weakness: Secondary | ICD-10-CM

## 2015-05-02 DIAGNOSIS — M25651 Stiffness of right hip, not elsewhere classified: Secondary | ICD-10-CM

## 2015-05-02 DIAGNOSIS — R609 Edema, unspecified: Secondary | ICD-10-CM

## 2015-05-02 NOTE — Therapy (Addendum)
[place], [place], [address] Phone: [phone]   Fax:  [phone]  Physical Therapy Treatment  Patient Details  Name: Christopher Robertson MRN: [id number] Date of Birth: 08/05/1951 Referring Provider:  Gregor Hams, MD  Encounter Date: 05/02/2015      PT End of Session - 05/02/15 1113    Visit Number 16   Number of Visits 20   Date for PT Re-Evaluation 05/16/15   PT Start Time 1103   PT Stop Time 1158   PT Time Calculation (min) 55 min   Activity Tolerance Patient limited by fatigue;Patient limited by pain      Past Medical History  Diagnosis Date  . Hypertension     Past Surgical History  Procedure Laterality Date  . Knee surgery  1980 &2002  . Femur fracture surgery  1985  . Total hip arthroplasty Right     There were no vitals filed for this visit.  Visit Diagnosis:  Weakness generalized  Stiffness of hip joint, right  Abnormality of gait  Edema      Subjective Assessment - 05/02/15 1113    Subjective Pt reports he is using TENS unit 3-4 x/day, allowing him to reduce his pain medicine.  His goal is to be not using RW in 2 wks.    Currently in Pain? Yes   Pain Score 2    Pain Location Groin   Pain Orientation Right   Pain Descriptors / Indicators Tightness;Tiring   Aggravating Factors  weight bearing    Pain Relieving Factors medicine, TENS use, ice.             [facility] PT Assessment - 05/02/15 0001    Assessment   Medical Diagnosis Rt THA   Onset Date/Surgical Date 02/28/15   Next MD Visit 05/22/15   Prior Therapy HHPT in [place]   Precautions   Precautions Anterior Hip          [facility] Adult PT Treatment/Exercise - 05/02/15 0001    Ambulation/Gait   Ambulation/Gait Yes   Ambulation/Gait Assistance 4: Min guard;6: Modified independent (Device/Increase time)   Ambulation Distance (Feet) --  80 ft trials x 4 reps; 120 ft x 1; Min A progress to Mod I   Assistive device Straight cane;1 person hand held assist   Gait Pattern Step-through pattern;Decreased hip/knee flexion - right;Decreased dorsiflexion - right;Decreased weight shift to right   Gait Comments Hand over hand to assist with reciprocal arm swing. VC to increase Rt step length (continues to be decreased), increase Rt knee flexion during swijng through.  Pt continues with decreased weight shift and stance time on RLE, however improving with repetition.    Knee/Hip Exercises: Stretches   Sports administrator 2 reps;60 Scientist, research (physical sciences) Limitations with MFR with ball in mid-quad    Hip Flexor Stretch Right;60 seconds;3 reps  standing with UE support   Gastroc Stretch 2 reps;30 seconds;Both   Soleus Stretch 2 reps;Both;30 seconds   Other Knee/Hip Stretches Hip adductor stretch standing x 30 sec x 2   Knee/Hip Exercises: Aerobic   Nustep L5: 6 min    Knee/Hip Exercises: Machines for Strengthening   Cybex Knee Extension 2 plates: BLE into extension, RLE controlled return to flexion x 10 reps, 2 sets   Cybex Leg Press  4 plates x 10 reps x 2 set (seat#8)   Knee/Hip Exercises: Standing   Heel Raises Both;2 sets;10 reps   Forward Step Up Right;Step Height:  6";Hand Hold: 2  8 reps   Modalities   Modalities Electrical Stimulation;Cryotherapy   Cryotherapy   Number Minutes Cryotherapy 15 Minutes   Cryotherapy Location Hip   Type of Cryotherapy Ice pack   Electrical Stimulation   Electrical Stimulation Location Rt proximal quad/ lateral thigh    Electrical Stimulation Action IFC   Electrical Stimulation Parameters to tolerance    Electrical Stimulation Goals Pain                  PT Short Term Goals - 04/28/15 1518    PT SHORT TERM GOAL #1   Title I with initial HEP ( 04/18/15)   Time 4   Period Weeks   Status Achieved   PT SHORT TERM GOAL #2   Title increase strength Rt hip =/> 4/5 ( 04/18/15)   Time 4   Period Weeks   Status Achieved   PT SHORT TERM GOAL #3    Title ambulate with straight cane on even surfaces independently ( 0916/16)   Time 7   Period Weeks   Status Revised   PT SHORT TERM GOAL #4   Title improve FOTO =/< 60% limited ( 04/18/15)   Time 7   Period Weeks   Status On-going   PT SHORT TERM GOAL #5   Title report pain decrease to allow him to only need OTC medication ( 04/18/15)    Time 4   Period Weeks   Status On-going           PT Long Term Goals - 04/28/15 1521    PT LONG TERM GOAL #1   Title I with advanced HEP ( 05/16/15)    Time 8   Period Weeks   Status On-going   PT LONG TERM GOAL #2   Title demo Rt LE strength =/> 5-/5 ( 05/16/15)    Time 8   Period Weeks   Status On-going   PT LONG TERM GOAL #3   Title ambulate without an assistive device on even and uneven surfaces ( 05/16/15)    Time 8   Period Weeks   Status On-going   PT LONG TERM GOAL #4   Title improve FOTO =/< 54% limited ( 05/16/15)    Time 8   Period Weeks   PT LONG TERM GOAL #5   Title perform lifitng activites to simulate work per MD restriction for lifting ( 05/16/15)    Time 8   Period Weeks   Status On-going   PT LONG TERM GOAL #6   Title climb on /off back of trailer without difficulty to simulate work ( 05/16/15)    Time 8   Period Weeks   Status On-going               Plan - 05/02/15 1203    Clinical Impression Statement Pt continues to gain confidence with gait trials, slowly increasing distance length.  Pt tolerated all exercises with increased resistance/difficulty with mild pain increase. Pt reported that pain reduced with cryotherapy and estim at end of session.    Pt will benefit from skilled therapeutic intervention in order to improve on the following deficits Abnormal gait;Decreased range of motion;Increased muscle spasms;Pain;Decreased balance;Decreased mobility;Decreased strength;Increased edema   Rehab Potential Good   Clinical Impairments Affecting Rehab Potential previous injuries to the Rt femur   PT Frequency  3x / week   PT Duration 4 weeks   PT Treatment/Interventions Moist Heat;Ultrasound;Therapeutic exercise;Taping;Vasopneumatic Device;Manual techniques;Balance training;DME Instruction;Neuromuscular re-education;Gait training;Cryotherapy;Electrical Stimulation;Stair training;Patient/family  education;Scar mobilization   PT Next Visit Plan continue progressive strengthening/stretching to Rt hip, gait trials.    Consulted and Agree with Plan of Care Patient        Problem List Patient Active Problem List   Diagnosis Date Noted  . Insomnia 04/22/2015  . 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 Occult blood positive stool 01/31/2014  . 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 Testicular mass 08/18/2012  . Hypertension 08/18/2012  . Smoker 08/18/2012  . Preventive measure 08/18/2012    Kerin Perna, PTA 05/02/2015 12:05 PM  [place], [place], [address] Phone: [phone]   Fax:  [phone]     PHYSICAL THERAPY DISCHARGE SUMMARY  Visits from Start of Care: 16  Current functional level related to goals / functional outcomes: Going to a community gym   Remaining deficits: unknown   Education / Equipment: HEP  Plan: Patient agrees to discharge.  Patient goals were not met. Patient is being discharged due to financial reasons. Patient has lost his job and his insurance.  ?????    Jeral Pinch, PT 05/27/2015 9:20 AM

## 2015-05-06 ENCOUNTER — Encounter: Payer: Commercial Managed Care - PPO | Admitting: Rehabilitative and Restorative Service Providers"

## 2015-05-08 ENCOUNTER — Encounter: Payer: Commercial Managed Care - PPO | Admitting: Physical Therapy

## 2015-05-16 ENCOUNTER — Other Ambulatory Visit: Payer: Self-pay | Admitting: Urology

## 2015-05-19 ENCOUNTER — Encounter (HOSPITAL_BASED_OUTPATIENT_CLINIC_OR_DEPARTMENT_OTHER): Payer: Self-pay | Admitting: *Deleted

## 2015-05-20 ENCOUNTER — Ambulatory Visit (INDEPENDENT_AMBULATORY_CARE_PROVIDER_SITE_OTHER): Payer: Commercial Managed Care - PPO | Admitting: Sports Medicine

## 2015-05-20 ENCOUNTER — Encounter: Payer: Self-pay | Admitting: Sports Medicine

## 2015-05-20 ENCOUNTER — Encounter (HOSPITAL_BASED_OUTPATIENT_CLINIC_OR_DEPARTMENT_OTHER): Payer: Self-pay | Admitting: *Deleted

## 2015-05-20 VITALS — BP 158/87 | HR 98 | Wt 276.0 lb

## 2015-05-20 DIAGNOSIS — Z9660 Presence of unspecified orthopedic joint implant: Secondary | ICD-10-CM

## 2015-05-20 DIAGNOSIS — N5089 Other specified disorders of the male genital organs: Secondary | ICD-10-CM

## 2015-05-20 DIAGNOSIS — N508 Other specified disorders of male genital organs: Secondary | ICD-10-CM | POA: Diagnosis not present

## 2015-05-20 DIAGNOSIS — Z96649 Presence of unspecified artificial hip joint: Secondary | ICD-10-CM

## 2015-05-20 MED ORDER — OXYCODONE-ACETAMINOPHEN 10-325 MG PO TABS: 1.0000 | ORAL_TABLET | Freq: Three times a day (TID) | ORAL | Status: DC | PRN

## 2015-05-20 NOTE — Assessment & Plan Note (Signed)
Refilling Percocet, #80 per month. Continue therapy at home. Return to see me in 3 months.

## 2015-05-20 NOTE — Progress Notes (Signed)
NPO AFTER MN. ARRIVE AT 0715.  NEEDS ISTAT. CURRENT EKG IN CHART AND EPIC.  WILL TAKE NORVASC AND DO INHALER AM DOS W/ SIPS OF WATER.

## 2015-05-20 NOTE — Progress Notes (Signed)
  Subjective:    CC: Follow-up  HPI: Christopher Robertson is now 2 months post total hip arthroplasty on the right side in [place], continues to do well. He has transitioned from formal physical therapy to his home exercise regimen in the gym, and is currently working past active range of motion and is on strengthening.  Hydrocele: Scheduled for hydrocelectomy in 3 days.  Past medical history, Surgical history, Family history not pertinant except as noted below, Social history, Allergies, and medications have been entered into the medical record, reviewed, and no changes needed.   Review of Systems: No fevers, chills, night sweats, weight loss, chest pain, or shortness of breath.   Objective:    General: Well Developed, well nourished, and in no acute distress.  Neuro: Alert and oriented x3, extra-ocular muscles intact, sensation grossly intact.  HEENT: Normocephalic, atraumatic, pupils equal round reactive to light, neck supple, no masses, no lymphadenopathy, thyroid nonpalpable.  Skin: Warm and dry, no rashes. Cardiac: Regular rate and rhythm, no murmurs rubs or gallops, no lower extremity edema.  Respiratory: Clear to auscultation bilaterally. Not using accessory muscles, speaking in full sentences. Right hip: Flexion actively to 90, extension to 30 past neutral, anatomic abduction and adduction. Good strength. Ambulates with a rolling walker.  Impression and Recommendations:

## 2015-05-20 NOTE — Assessment & Plan Note (Signed)
Hydrocelectomy coming up in 3 days.

## 2015-05-23 ENCOUNTER — Encounter (HOSPITAL_BASED_OUTPATIENT_CLINIC_OR_DEPARTMENT_OTHER)
Admission: RE | Disposition: A | Discharge status: Home / Self Care | Payer: Self-pay | Source: Ambulatory Visit | Attending: Urology

## 2015-05-23 ENCOUNTER — Ambulatory Visit (HOSPITAL_BASED_OUTPATIENT_CLINIC_OR_DEPARTMENT_OTHER): Payer: Commercial Managed Care - PPO | Admitting: Anesthesiology

## 2015-05-23 ENCOUNTER — Encounter (HOSPITAL_BASED_OUTPATIENT_CLINIC_OR_DEPARTMENT_OTHER): Payer: Self-pay | Admitting: Anesthesiology

## 2015-05-23 ENCOUNTER — Ambulatory Visit (HOSPITAL_BASED_OUTPATIENT_CLINIC_OR_DEPARTMENT_OTHER)
Admission: RE | Admit: 2015-05-23 | Discharge: 2015-05-23 | Disposition: A | Discharge status: Home / Self Care | Payer: Commercial Managed Care - PPO | Source: Ambulatory Visit | Attending: Urology | Admitting: Urology

## 2015-05-23 DIAGNOSIS — I10 Essential (primary) hypertension: Secondary | ICD-10-CM | POA: Diagnosis not present

## 2015-05-23 DIAGNOSIS — Z79891 Long term (current) use of opiate analgesic: Secondary | ICD-10-CM | POA: Insufficient documentation

## 2015-05-23 DIAGNOSIS — Z6835 Body mass index (BMI) 35.0-35.9, adult: Secondary | ICD-10-CM | POA: Diagnosis not present

## 2015-05-23 DIAGNOSIS — Z87891 Personal history of nicotine dependence: Secondary | ICD-10-CM | POA: Diagnosis not present

## 2015-05-23 DIAGNOSIS — Z7982 Long term (current) use of aspirin: Secondary | ICD-10-CM | POA: Insufficient documentation

## 2015-05-23 DIAGNOSIS — E669 Obesity, unspecified: Secondary | ICD-10-CM | POA: Insufficient documentation

## 2015-05-23 DIAGNOSIS — N433 Hydrocele, unspecified: Secondary | ICD-10-CM | POA: Diagnosis not present

## 2015-05-23 DIAGNOSIS — Z79899 Other long term (current) drug therapy: Secondary | ICD-10-CM | POA: Insufficient documentation

## 2015-05-23 DIAGNOSIS — J449 Chronic obstructive pulmonary disease, unspecified: Secondary | ICD-10-CM | POA: Diagnosis not present

## 2015-05-23 DIAGNOSIS — M1990 Unspecified osteoarthritis, unspecified site: Secondary | ICD-10-CM | POA: Diagnosis not present

## 2015-05-23 DIAGNOSIS — J45909 Unspecified asthma, uncomplicated: Secondary | ICD-10-CM | POA: Insufficient documentation

## 2015-05-23 HISTORY — DX: Unspecified osteoarthritis, unspecified site: M19.90

## 2015-05-23 HISTORY — DX: Hydrocele, unspecified: N43.3

## 2015-05-23 HISTORY — DX: Chronic obstructive pulmonary disease, unspecified: J44.9

## 2015-05-23 HISTORY — PX: HYDROCELE EXCISION: SHX482

## 2015-05-23 HISTORY — DX: Personal history of traumatic brain injury: Z87.820

## 2015-05-23 LAB — POCT I-STAT 4, (NA,K, GLUC, HGB,HCT)
Glucose, Bld: 125 mg/dL — ABNORMAL HIGH (ref 65–99)
HCT: 47 % (ref 39.0–52.0)
Hemoglobin: 16 g/dL (ref 13.0–17.0)
Potassium: 4.1 mmol/L (ref 3.5–5.1)
Sodium: 134 mmol/L — ABNORMAL LOW (ref 135–145)

## 2015-05-23 SURGERY — HYDROCELECTOMY
Anesthesia: General | Laterality: Right

## 2015-05-23 MED ORDER — SODIUM CHLORIDE 0.9 % IR SOLN
Status: DC | PRN
  Administered 2015-05-23: 1000 mL

## 2015-05-23 MED ORDER — LIDOCAINE HCL (CARDIAC) 20 MG/ML IV SOLN
INTRAVENOUS | Status: DC | PRN
  Administered 2015-05-23: 100 mg via INTRAVENOUS

## 2015-05-23 MED ORDER — ACETAMINOPHEN 10 MG/ML IV SOLN
INTRAVENOUS | Status: DC | PRN
  Administered 2015-05-23: 1000 mg via INTRAVENOUS

## 2015-05-23 MED ORDER — OXYCODONE HCL 5 MG PO TABS
5.0000 mg | ORAL_TABLET | Freq: Once | ORAL | Status: AC
  Administered 2015-05-23: 5 mg via ORAL
  Filled 2015-05-23: qty 1

## 2015-05-23 MED ORDER — MIDAZOLAM HCL 5 MG/5ML IJ SOLN
INTRAMUSCULAR | Status: DC | PRN
  Administered 2015-05-23: 2 mg via INTRAVENOUS

## 2015-05-23 MED ORDER — EPHEDRINE SULFATE 50 MG/ML IJ SOLN
INTRAMUSCULAR | Status: DC | PRN
  Administered 2015-05-23 (×3): 10 mg via INTRAVENOUS

## 2015-05-23 MED ORDER — FENTANYL CITRATE (PF) 100 MCG/2ML IJ SOLN
INTRAMUSCULAR | Status: AC
  Filled 2015-05-23: qty 4

## 2015-05-23 MED ORDER — ONDANSETRON HCL 4 MG/2ML IJ SOLN
INTRAMUSCULAR | Status: DC | PRN
  Administered 2015-05-23: 4 mg via INTRAVENOUS

## 2015-05-23 MED ORDER — FENTANYL CITRATE (PF) 100 MCG/2ML IJ SOLN
INTRAMUSCULAR | Status: DC | PRN
  Administered 2015-05-23 (×4): 50 ug via INTRAVENOUS

## 2015-05-23 MED ORDER — CEFAZOLIN SODIUM-DEXTROSE 2-3 GM-% IV SOLR
INTRAVENOUS | Status: AC
  Filled 2015-05-23: qty 50

## 2015-05-23 MED ORDER — PROPOFOL 10 MG/ML IV BOLUS
INTRAVENOUS | Status: DC | PRN
  Administered 2015-05-23: 200 mg via INTRAVENOUS

## 2015-05-23 MED ORDER — DEXAMETHASONE SODIUM PHOSPHATE 4 MG/ML IJ SOLN
INTRAMUSCULAR | Status: DC | PRN
  Administered 2015-05-23: 10 mg via INTRAVENOUS

## 2015-05-23 MED ORDER — PROMETHAZINE HCL 25 MG/ML IJ SOLN
6.2500 mg | INTRAMUSCULAR | Status: DC | PRN
  Filled 2015-05-23: qty 1

## 2015-05-23 MED ORDER — CEFAZOLIN SODIUM 1-5 GM-% IV SOLN
INTRAVENOUS | Status: AC
  Filled 2015-05-23: qty 50

## 2015-05-23 MED ORDER — BUPIVACAINE-EPINEPHRINE 0.5% -1:200000 IJ SOLN
INTRAMUSCULAR | Status: DC | PRN
  Administered 2015-05-23: 10 mL

## 2015-05-23 MED ORDER — FENTANYL CITRATE (PF) 100 MCG/2ML IJ SOLN
25.0000 ug | INTRAMUSCULAR | Status: DC | PRN
  Administered 2015-05-23: 25 ug via INTRAVENOUS
  Filled 2015-05-23: qty 1

## 2015-05-23 MED ORDER — OXYCODONE HCL 10 MG PO TABS: 10.0000 mg | ORAL_TABLET | ORAL | Status: DC | PRN

## 2015-05-23 MED ORDER — MIDAZOLAM HCL 2 MG/2ML IJ SOLN
INTRAMUSCULAR | Status: AC
  Filled 2015-05-23: qty 2

## 2015-05-23 MED ORDER — CEFAZOLIN SODIUM-DEXTROSE 2-3 GM-% IV SOLR
2.0000 g | INTRAVENOUS | Status: DC
  Filled 2015-05-23: qty 50

## 2015-05-23 MED ORDER — FENTANYL CITRATE (PF) 100 MCG/2ML IJ SOLN
INTRAMUSCULAR | Status: AC
  Filled 2015-05-23: qty 2

## 2015-05-23 MED ORDER — DEXTROSE 5 % IV SOLN
3.0000 g | INTRAVENOUS | Status: AC
  Administered 2015-05-23: 3 g via INTRAVENOUS
  Filled 2015-05-23: qty 3000

## 2015-05-23 MED ORDER — DOXYCYCLINE HYCLATE 50 MG PO CAPS: 50.0000 mg | ORAL_CAPSULE | Freq: Every day | ORAL | Status: DC

## 2015-05-23 MED ORDER — LACTATED RINGERS IV SOLN
INTRAVENOUS | Status: DC
  Administered 2015-05-23 (×2): via INTRAVENOUS
  Filled 2015-05-23: qty 1000

## 2015-05-23 MED ORDER — OXYCODONE HCL 5 MG PO TABS
ORAL_TABLET | ORAL | Status: AC
  Filled 2015-05-23: qty 1

## 2015-05-23 SURGICAL SUPPLY — 41 items
BLADE CLIPPER SURG (BLADE) IMPLANT
BLADE SURG 15 STRL LF DISP TIS (BLADE) ×1 IMPLANT
BLADE SURG 15 STRL SS (BLADE) ×1
BNDG GAUZE ELAST 4 BULKY (GAUZE/BANDAGES/DRESSINGS) ×2 IMPLANT
CANISTER SUCTION 1200CC (MISCELLANEOUS) IMPLANT
CANISTER SUCTION 2500CC (MISCELLANEOUS) IMPLANT
CLEANER CAUTERY TIP 5X5 PAD (MISCELLANEOUS) IMPLANT
CLOTH BEACON ORANGE TIMEOUT ST (SAFETY) ×2 IMPLANT
COVER BACK TABLE 60X90IN (DRAPES) ×2 IMPLANT
COVER MAYO STAND STRL (DRAPES) ×2 IMPLANT
DISSECTOR ROUND CHERRY 3/8 STR (MISCELLANEOUS) IMPLANT
DRAIN PENROSE 18X1/4 LTX STRL (WOUND CARE) ×2 IMPLANT
DRAPE PED LAPAROTOMY (DRAPES) ×2 IMPLANT
DRSG KUZMA FLUFF (GAUZE/BANDAGES/DRESSINGS) ×2 IMPLANT
ELECT REM PT RETURN 9FT ADLT (ELECTROSURGICAL)
ELECTRODE REM PT RTRN 9FT ADLT (ELECTROSURGICAL) IMPLANT
GLOVE BIO SURGEON STRL SZ8 (GLOVE) ×2 IMPLANT
GOWN STRL REUS W/ TWL LRG LVL3 (GOWN DISPOSABLE) ×1 IMPLANT
GOWN STRL REUS W/ TWL XL LVL3 (GOWN DISPOSABLE) ×1 IMPLANT
GOWN STRL REUS W/TWL LRG LVL3 (GOWN DISPOSABLE) ×1
GOWN STRL REUS W/TWL XL LVL3 (GOWN DISPOSABLE) ×1
NEEDLE HYPO 25X1 1.5 SAFETY (NEEDLE) IMPLANT
NS IRRIG 500ML POUR BTL (IV SOLUTION) ×2 IMPLANT
PACK BASIN DAY SURGERY FS (CUSTOM PROCEDURE TRAY) ×2 IMPLANT
PAD CLEANER CAUTERY TIP 5X5 (MISCELLANEOUS)
PENCIL BUTTON HOLSTER BLD 10FT (ELECTRODE) ×2 IMPLANT
SPONGE GAUZE 4X4 12PLY STER LF (GAUZE/BANDAGES/DRESSINGS) ×2 IMPLANT
SUPPORT SCROTAL LG STRP (MISCELLANEOUS) ×2 IMPLANT
SUT CHROMIC 3 0 SH 27 (SUTURE) ×8 IMPLANT
SUT ETHILON 3 0 PS 1 (SUTURE) ×2 IMPLANT
SUT SILK 4 0 TIES 17X18 (SUTURE) IMPLANT
SUT VIC AB 4-0 RB1 27 (SUTURE)
SUT VIC AB 4-0 RB1 27X BRD (SUTURE) IMPLANT
SUT VICRYL 6 0 RB 1 (SUTURE) IMPLANT
SYR CONTROL 10ML LL (SYRINGE) IMPLANT
TOWEL OR 17X24 6PK STRL BLUE (TOWEL DISPOSABLE) ×4 IMPLANT
TRAY DSU PREP LF (CUSTOM PROCEDURE TRAY) ×2 IMPLANT
TUBE CONNECTING 12X1/4 (SUCTIONS) ×2 IMPLANT
WATER STERILE IRR 3000ML UROMA (IV SOLUTION) IMPLANT
WATER STERILE IRR 500ML POUR (IV SOLUTION) ×2 IMPLANT
YANKAUER SUCT BULB TIP NO VENT (SUCTIONS) ×2 IMPLANT

## 2015-05-23 NOTE — Anesthesia Procedure Notes (Signed)
Procedure Name: LMA Insertion Date/Time: 05/23/2015 9:52 AM Performed by: Norva Pavlov Pre-anesthesia Checklist: Patient identified, Emergency Drugs available, Suction available and Patient being monitored Patient Re-evaluated:Patient Re-evaluated prior to inductionOxygen Delivery Method: Circle System Utilized Preoxygenation: Pre-oxygenation with 100% oxygen Intubation Type: IV induction Ventilation: Mask ventilation without difficulty LMA: LMA inserted LMA Size: 5.0 Number of attempts: 1 Airway Equipment and Method: bite block Placement Confirmation: positive ETCO2 Tube secured with: Tape Dental Injury: Teeth and Oropharynx as per pre-operative assessment

## 2015-05-23 NOTE — Transfer of Care (Signed)
Last Vitals:  Filed Vitals:   05/23/15 0740  BP: 123/68  Pulse: 100  Temp: 36.4 C  Resp: 18    Immediate Anesthesia Transfer of Care Note  Patient: Christopher Robertson  Procedure(s) Performed: Procedure(s) (LRB): RIGHT HYDROCELECTOMY ADULT (Right)  Patient Location: PACU  Anesthesia Type: General  Level of Consciousness: awake, alert  and oriented  Airway & Oxygen Therapy: Patient Spontanous Breathing and Patient connected to face mask oxygen  Post-op Assessment: Report given to PACU RN and Post -op Vital signs reviewed and stable  Post vital signs: Reviewed and stable  Complications: No apparent anesthesia complications

## 2015-05-23 NOTE — Anesthesia Postprocedure Evaluation (Signed)
  Anesthesia Post-op Note  Patient: Christopher Robertson  Procedure(s) Performed: Procedure(s) (LRB): RIGHT HYDROCELECTOMY ADULT (Right)  Patient Location: PACU  Anesthesia Type: General  Level of Consciousness: awake and alert   Airway and Oxygen Therapy: Patient Spontanous Breathing  Post-op Pain: mild  Post-op Assessment: Post-op Vital signs reviewed, Patient's Cardiovascular Status Stable, Respiratory Function Stable, Patent Airway and No signs of Nausea or vomiting  Last Vitals:  Filed Vitals:   05/23/15 1136  BP:   Pulse: 89  Temp:   Resp: 16    Post-op Vital Signs: stable   Complications: No apparent anesthesia complications

## 2015-05-23 NOTE — Op Note (Signed)
PATIENT:  Christopher Robertson  PRE-OPERATIVE DIAGNOSIS: Right hydrocele  POST-OPERATIVE DIAGNOSIS:  Same  PROCEDURE:  Procedure(s): Right hydrocelectomy  SURGEON:  [place]  INDICATION: The patient is a 64 year old male with a slowly enlarging right hydrocele that has become quite large. He is not having any pain. We discussed the treatment options and he is elected to proceed with surgery.   ANESTHESIA:  General  EBL:  Minimal  DRAINS:Quarter inch Penrose drain   LOCAL MEDICATIONS USED: Half percent Marcaine with epinephrine   SPECIMEN: Hydrocele sac to pathology    Description of procedure: After informed consent the patient was brought to the major OR, placed on the table and administered general anesthesia. His genitalia was then sterilely prepped and draped. An official timeout was then performed.  A midline median raphae scrotal incision was then made and carried down over the right hydrocele. The tissue over the hydrocele was cleared using a combination of sharp and blunt technique.  I noted however that the sac surrounding the testicle was very thick. The hydrocele was then opened by incising through the thick rind, drained of500 mL  clear amber fluid and delivered through the incision. The excess hydrocele sac tissue was excised with the Bovie electrocautery and then I cauterized the edges. I then reapproximated oversewed the edges with a running, locking 3-0 chromic suture.   His testicle was then replaced in the normal anatomic position and his right hemiscrotum.  I then located the most dependent portion of the right hemiscrotum and incised the skin with a scalpel over a right angle which was then passed through the skin and the Penrose drain was grasped and brought into the right hemiscrotum. This was secured to the skin with a 3-0 silk suture. I then closed the deep scrotal tissue with running 3-0 chromic suture in a locking fashion. I injected half percent  Marcaine in the subcutaneous tissue and closed the skin with running 3-0 chromic. Neosporin, a sterile gauze dressing, fluff Kerlix and a scrotal support were applied. The patient tolerated the procedure well no intraoperative complications. Needle sponge and instrument counts were correct at the end of the operation.   PLAN OF CARE: Discharge to home after PACU  PATIENT DISPOSITION:  PACU - hemodynamically stable.

## 2015-05-23 NOTE — H&P (Signed)
Christopher Robertson is a 64 year old male   History of Present Illness Right hydrocele: The patient reported this has been present but increasing in size over 10 years. He believes it began after testicular trauma. It is non-tender. AFP, beta hCG and LDH levels were checked and found to be normal as initially there was no transillumination so a scrotal ultrasound was obtained which revealed a complex right hydrocele.     Hypogonadism: Testosterone 12/13 - 134.6/27.4     He was referred for evaluation of his right hydrocele in 1/14 but canceled his appointment. He told me that he has had a hydrocele for over 10 years. He said he believes it began after he injured himself in the groin region and has continued to increase in size. He just had right hip surgery and it is causing him discomfort because of its large size which is pulling on his groin region and he is interested in having it addressed. He denies voiding symptoms. He does not have any pain in the testicle and otherwise has no urologic complaints.   Past Medical History Problems  1. History of arthritis (Z87.39) 2. History of asthma (Z87.09) 3. History of hypertension (Z86.79)  Surgical History Problems  1. History of Hip Surgery Right 2. History of Knee Surgery Left 3. History of Knee Surgery Right  Current Meds 1. AmLODIPine Besylate 10 MG Oral Tablet;  Therapy: (Recorded:15Sep2016) to Recorded 2. Aspirin 325 MG Oral Tablet;  Therapy: (Recorded:15Sep2016) to Recorded 3. Belsomra 10 MG Oral Tablet;  Therapy: (Recorded:15Sep2016) to Recorded 4. Breonesin CAPS;  Therapy: (Recorded:15Sep2016) to Recorded 5. Lisinopril-Hydrochlorothiazide 20-25 MG Oral Tablet;  Therapy: (Recorded:15Sep2016) to Recorded 6. OxyCODONE HCl - 10 MG Oral Tablet;  Therapy: (Recorded:15Sep2016) to Recorded  Allergies Medication  1. No Known Drug Allergies  Family History Problems  1. Family history of colon cancer (Z80.0) : Father 2.  Family history of dementia (Z81.8) : Mother  Social History Problems    Denied: History of Alcohol use   Caffeine use (F15.90)   Former smoker [phone])   1ppdx 40 years ago   Married  Review of Systems Genitourinary, constitutional, skin, eye, otolaryngeal, hematologic/lymphatic, cardiovascular, pulmonary, endocrine, musculoskeletal, gastrointestinal, neurological and psychiatric system(s) were reviewed and pertinent findings if present are noted and are otherwise negative.  Genitourinary: nocturia and erectile dysfunction.    Vitals Vital Signs Height: 6 ft 2 in Weight: 260 lb  BMI Calculated: 33.38 BSA Calculated: 2.43 Blood Pressure: 178 / 90 Heart Rate: 86  Physical Exam Constitutional: Well nourished and well developed . No acute distress.  ENT:. The ears and nose are normal in appearance.  Neck: The appearance of the neck is normal and no neck mass is present.  Pulmonary: No respiratory distress and normal respiratory rhythm and effort.  Cardiovascular: Heart rate and rhythm are normal . No peripheral edema.  Abdomen: The abdomen is soft and nontender. No masses are palpated. No CVA tenderness. No hernias are palpable. No hepatosplenomegaly noted.  Genitourinary: Examination of the penis demonstrates no discharge, no masses, no lesions and a normal meatus. The scrotum is without lesions. Examination of the right scrotum demonstrates a hydrocele. The left epididymis is palpably normal and non-tender. The right testis is nonpalpable. The left testis is non-tender and without masses.  Lymphatics: The femoral and inguinal nodes are not enlarged or tender.  Skin: Normal skin turgor, no visible rash and no visible skin lesions.  Neuro/Psych:. Mood and affect are appropriate.    Results/Data Urine  COLOR AMBER  APPEARANCE CLEAR  SPECIFIC GRAVITY >1.030  pH 5.5  GLUCOSE NEGATIVE  BILIRUBIN NEGATIVE  KETONE NEGATIVE  BLOOD NEGATIVE  PROTEIN NEGATIVE  NITRITE NEGATIVE   LEUKOCYTE ESTERASE NEGATIVE   Assessment   He has a very large right hydrocele. We therefore discussed the options for treatment which she is interested in. I told him it could be aspirated although it would likely recur and therefore definitive therapy would require excision. I went over the procedure of hydrocelectomy with him in detail. We discussed the incision used, the risks and complications, the outpatient nature of the procedure as well as the anticipated postoperative course. He also understands the need for a Penrose drain to be left in place after the procedure in order to reduce the risk of recurrence. I have answered his questions and he has elected to proceed.   Plan  1. He will remain off his aspirin.  2. He will be scheduled for a right hydrocelectomy with the placement of a Penrose drain at the time of surgery.

## 2015-05-23 NOTE — Anesthesia Preprocedure Evaluation (Addendum)
Anesthesia Evaluation  Patient identified by MRN, date of birth, ID band Patient awake    Reviewed: Allergy & Precautions, NPO status , Patient's Chart, lab work & pertinent test results  Airway Mallampati: II  TM Distance: >3 FB Neck ROM: Full    Dental no notable dental hx.    Pulmonary COPD, Current Smoker,    Pulmonary exam normal breath sounds clear to auscultation       Cardiovascular Exercise Tolerance: Good hypertension, Pt. on medications Normal cardiovascular exam Rhythm:Regular Rate:Normal     Neuro/Psych negative neurological ROS  negative psych ROS   GI/Hepatic negative GI ROS, Neg liver ROS,   Endo/Other  negative endocrine ROS  Renal/GU negative Renal ROS  negative genitourinary   Musculoskeletal  (+) Arthritis ,   Abdominal (+) + obese,   Peds negative pediatric ROS (+)  Hematology negative hematology ROS (+)   Anesthesia Other Findings   Reproductive/Obstetrics negative OB ROS                            Anesthesia Physical Anesthesia Plan  ASA: II  Anesthesia Plan: General   Post-op Pain Management:    Induction: Intravenous  Airway Management Planned: LMA  Additional Equipment:   Intra-op Plan:   Post-operative Plan: Extubation in OR  Informed Consent: I have reviewed the patients History and Physical, chart, labs and discussed the procedure including the risks, benefits and alternatives for the proposed anesthesia with the patient or authorized representative who has indicated his/her understanding and acceptance.   Dental advisory given  Plan Discussed with: CRNA  Anesthesia Plan Comments:         Anesthesia Quick Evaluation

## 2015-05-23 NOTE — Discharge Instructions (Signed)
Scrotal surgery postoperative instructions ° °Wound: ° °In most cases your incision will have absorbable sutures that will dissolve within the first 10-20 days. Some will fall out even earlier. Expect some redness as the sutures dissolved but this should occur only around the sutures. If there is generalized redness, especially with increasing pain or swelling, let us know. The scrotum will very likely get "black and blue" as the blood in the tissues spread. Sometimes the whole scrotum will turn colors. The black and blue is followed by a yellow and brown color. In time, all the discoloration will go away. In some cases some firm swelling in the area of the testicle may persist for up to 4-6 weeks after the surgery and is considered normal in most cases. ° °Diet: ° °You may return to your normal diet within 24 hours following your surgery. You may note some mild nausea and possibly vomiting the first 6-8 hours following surgery. This is usually due to the side effects of anesthesia, and will disappear quite soon. I would suggest clear liquids and a very light meal the first evening following your surgery. ° °Activity: ° °Your physical activity should be restricted the first 48 hours. During that time you should remain relatively inactive, moving about only when necessary. During the first 7-10 days following surgery he should avoid lifting any heavy objects (anything greater than 15 pounds), and avoid strenuous exercise. If you work, ask us specifically about your restrictions, both for work and home. We will write a note to your employer if needed. ° °You should plan to wear a tight pair of jockey shorts or an athletic supporter for the first 4-5 days, even to sleep. This will keep the scrotum immobilized to some degree and keep the swelling down. ° °Ice packs should be placed on and off over the scrotum for the first 48 hours. Frozen peas or corn in a ZipLock bag can be frozen, used and re-frozen. Fifteen minutes  on and 15 minutes off is a reasonable schedule. The ice is a good pain reliever and keeps the swelling down. ° °Hygiene: ° °You may shower 48 hours after your surgery. Tub bathing should be restricted until the seventh day. ° ° ° ° ° ° ° ° ° °Medication: ° °You will be sent home with some type of pain medication. In many cases you will be sent home with a narcotic pain pill (hydrococone or oxycodone). If the pain is not too bad, you may take either Tylenol (acetaminophen) or Advil (ibuprofen) which contain no narcotic agents, and might be tolerated a little better, with fewer side effects. If the pain medication you are sent home with does not control the pain, you will have to let us know. Some narcotic pain medications cannot be given or refilled by a phone call to a pharmacy. ° °Problems you should report to us: ° °· Fever of 101.0 degrees Fahrenheit or greater. °· Moderate or severe swelling under the skin incision or involving the scrotum. °· Drug reaction such as hives, a rash, nausea or vomiting. °·  ° ° °Post Anesthesia Home Care Instructions ° °Activity: °Get plenty of rest for the remainder of the day. A responsible adult should stay with you for 24 hours following the procedure.  °For the next 24 hours, DO NOT: °-Drive a car °-Operate machinery °-Drink alcoholic beverages °-Take any medication unless instructed by your physician °-Make any legal decisions or sign important papers. ° °Meals: °Start with liquid foods such as   gelatin or soup. Progress to regular foods as tolerated. Avoid greasy, spicy, heavy foods. If nausea and/or vomiting occur, drink only clear liquids until the nausea and/or vomiting subsides. Call your physician if vomiting continues. ° °Special Instructions/Symptoms: °Your throat may feel dry or sore from the anesthesia or the breathing tube placed in your throat during surgery. If this causes discomfort, gargle with warm salt water. The discomfort should disappear within 24  hours. ° °If you had a scopolamine patch placed behind your ear for the management of post- operative nausea and/or vomiting: ° °1. The medication in the patch is effective for 72 hours, after which it should be removed.  Wrap patch in a tissue and discard in the trash. Wash hands thoroughly with soap and water. °2. You may remove the patch earlier than 72 hours if you experience unpleasant side effects which may include dry mouth, dizziness or visual disturbances. °3. Avoid touching the patch. Wash your hands with soap and water after contact with the patch. °  ° °

## 2015-05-26 ENCOUNTER — Encounter (HOSPITAL_BASED_OUTPATIENT_CLINIC_OR_DEPARTMENT_OTHER): Payer: Self-pay | Admitting: Urology

## 2015-06-12 ENCOUNTER — Telehealth: Payer: Self-pay | Admitting: *Deleted

## 2015-06-12 NOTE — Telephone Encounter (Signed)
Pt called and states that he had a hydrocelectomy and he thinks the area is torn and "stiches are popping out."  He states he went to a f/u appt and everything was fine and the doctor sent him on his way. He he was supposed to go back and get stiches taken out a week ago, but he did not and now he has an open area near his surgical site. He states he spoke to the triage nurse he told him " that was not uncommon and to put neosporin on it." I advised him that he needs to f/u with the surgeons office. He should still be under the umbrella of care in which he can receive f/u services. Pt agreed and voiced understanding.

## 2015-06-19 ENCOUNTER — Telehealth: Payer: Self-pay

## 2015-06-19 NOTE — Telephone Encounter (Signed)
Yes too early, call back Monday and I will refill it with #60.  We will keep decreasing each month to wean him off.

## 2015-06-19 NOTE — Telephone Encounter (Signed)
Left detailed message on patient vm with instructions as noted below.Christopher Robertson,CMA  

## 2015-06-19 NOTE — Telephone Encounter (Signed)
Patient walked in requested a refill for Oxycodone. Patient was advised that 80 pills were dispensed on 05/22/15 and that it was too early to fill.  Patient stated that he did not have any left. Ronan Dion,CMA

## 2015-06-23 ENCOUNTER — Encounter: Payer: Self-pay | Admitting: Sports Medicine

## 2015-06-23 ENCOUNTER — Ambulatory Visit (INDEPENDENT_AMBULATORY_CARE_PROVIDER_SITE_OTHER): Payer: Commercial Managed Care - PPO

## 2015-06-23 ENCOUNTER — Ambulatory Visit (INDEPENDENT_AMBULATORY_CARE_PROVIDER_SITE_OTHER): Payer: Commercial Managed Care - PPO | Admitting: Sports Medicine

## 2015-06-23 VITALS — BP 153/87 | HR 101 | Wt 278.0 lb

## 2015-06-23 DIAGNOSIS — Z96649 Presence of unspecified artificial hip joint: Secondary | ICD-10-CM

## 2015-06-23 DIAGNOSIS — Z9660 Presence of unspecified orthopedic joint implant: Secondary | ICD-10-CM

## 2015-06-23 DIAGNOSIS — M25851 Other specified joint disorders, right hip: Secondary | ICD-10-CM | POA: Diagnosis not present

## 2015-06-23 DIAGNOSIS — N433 Hydrocele, unspecified: Secondary | ICD-10-CM | POA: Diagnosis not present

## 2015-06-23 DIAGNOSIS — S3131XA Laceration without foreign body of scrotum and testes, initial encounter: Secondary | ICD-10-CM

## 2015-06-23 DIAGNOSIS — Z96641 Presence of right artificial hip joint: Secondary | ICD-10-CM

## 2015-06-23 MED ORDER — DOXYCYCLINE HYCLATE 100 MG PO TABS: 100.0000 mg | ORAL_TABLET | Freq: Two times a day (BID) | ORAL | Status: AC

## 2015-06-23 MED ORDER — OXYCODONE-ACETAMINOPHEN 10-325 MG PO TABS: 1.0000 | ORAL_TABLET | Freq: Three times a day (TID) | ORAL | Status: DC | PRN

## 2015-06-23 NOTE — Assessment & Plan Note (Signed)
Postop last month, doing well. He did have some mild dehiscence of the wound, this was closed today in the office under local anesthesia.

## 2015-06-23 NOTE — Progress Notes (Signed)
  Subjective:    CC: follow-up  HPI: Left total hip arthroplasty: 3 months ago, in [place], no further follow-up was provided by his surgeon in [place] and he simply followed up with me, x-rays have looked normal in the past however he has continued to have great difficulty and pain bearing weight, he is agreeable this time to see one of our surgical colleagues for further assistance.  Right hydrocele: Post recent hydrocelectomy one month ago, unfortunately had a dehiscence of the wound on the right side, he is a smoker.  Past medical history, Surgical history, Family history not pertinant except as noted below, Social history, Allergies, and medications have been entered into the medical record, reviewed, and no changes needed.   Review of Systems: No fevers, chills, night sweats, weight loss, chest pain, or shortness of breath.   Objective:    General: Well Developed, well nourished, and in no acute distress.  Neuro: Alert and oriented x3, extra-ocular muscles intact, sensation grossly intact.  HEENT: Normocephalic, atraumatic, pupils equal round reactive to light, neck supple, no masses, no lymphadenopathy, thyroid nonpalpable.  Skin: Warm and dry, no rashes. Cardiac: Regular rate and rhythm, no murmurs rubs or gallops, no lower extremity edema.  Respiratory: Clear to auscultation bilaterally. Not using accessory muscles, speaking in full sentences. Scrotum: There is a dehiscence of the midline incision, there is a bit of granulation tissue and no evidence of bacterial superinfection.  Laceration repair: Indication: dehiscence of wound Location: scrotal midline Size: 5 cm Anesthesia: 1%lidocaine with epi, good effect Wound explored, irrigated, I did use the scalpel to do provide granulation tissue to a clean bleeding surface. Type of suture material: 4-0 prolene Number of sutures:6 simple interrupted Tolerated well Routine postprocedure instructions d/w pt- keep area clean and  bandaged, follow up if concerns/spreading erythema/pain.   Follow up for for suture removal.  Impression and Recommendations:   I spent 40 minutes with this patient, greater than 50% was face-to-face time counseling regarding the above diagnoses, this was separate from the time spent during the procedure

## 2015-06-23 NOTE — Assessment & Plan Note (Addendum)
[place], continues to have great difficulty bearing weight. Referral to Dr. Turner Danielsowan for further assistance. X-rays today. Refilling Percocet, #60, with a decrease of #20 per month.

## 2015-07-08 ENCOUNTER — Encounter: Payer: Self-pay | Admitting: Sports Medicine

## 2015-07-08 ENCOUNTER — Ambulatory Visit (INDEPENDENT_AMBULATORY_CARE_PROVIDER_SITE_OTHER): Payer: Commercial Managed Care - PPO | Admitting: Sports Medicine

## 2015-07-08 VITALS — BP 124/79 | HR 114 | Temp 97.8°F | Resp 18 | Wt 279.9 lb

## 2015-07-08 DIAGNOSIS — N433 Hydrocele, unspecified: Secondary | ICD-10-CM | POA: Diagnosis not present

## 2015-07-08 DIAGNOSIS — Z9660 Presence of unspecified orthopedic joint implant: Secondary | ICD-10-CM | POA: Diagnosis not present

## 2015-07-08 DIAGNOSIS — Z96649 Presence of unspecified artificial hip joint: Secondary | ICD-10-CM

## 2015-07-08 MED ORDER — OXYCODONE-ACETAMINOPHEN 10-325 MG PO TABS: 1.0000 | ORAL_TABLET | Freq: Every evening | ORAL | Status: DC | PRN

## 2015-07-08 MED ORDER — TRAMADOL HCL ER 100 MG PO TB24: 100.0000 mg | ORAL_TABLET | Freq: Every day | ORAL | Status: DC | PRN

## 2015-07-08 NOTE — Progress Notes (Signed)
  Subjective:    CC: Follow-up  HPI: Hydrocele: Did have some dehiscence of his midline scrotal incision, this was closed with simple interrupted Prolene sutures last visit, he is here for suture removal.  Hip arthroplasty, right: Has an appointment coming up with Dr. Turner Danielsowan.  Past medical history, Surgical history, Family history not pertinant except as noted below, Social history, Allergies, and medications have been entered into the medical record, reviewed, and no changes needed.   Review of Systems: No fevers, chills, night sweats, weight loss, chest pain, or shortness of breath.   Objective:    General: Well Developed, well nourished, and in no acute distress.  Neuro: Alert and oriented x3, extra-ocular muscles intact, sensation grossly intact.  HEENT: Normocephalic, atraumatic, pupils equal round reactive to light, neck supple, no masses, no lymphadenopathy, thyroid nonpalpable.  Skin: Warm and dry, no rashes. Cardiac: Regular rate and rhythm, no murmurs rubs or gallops, no lower extremity edema.  Respiratory: Clear to auscultation bilaterally. Not using accessory muscles, speaking in full sentences. Genital: Sutures are removed, there is incomplete closure with mild persistent dehiscence of the midline scrotal incision. I applied Dermabond to fully close.  Impression and Recommendations:

## 2015-07-08 NOTE — Assessment & Plan Note (Signed)
3/2 [place], he does have an appointment with Dr. Turner Danielsowan coming up. I will give him an additional 30 Percocet for the next month and #30 tramadol extended release 100 mg.

## 2015-07-08 NOTE — Assessment & Plan Note (Addendum)
Sutures removed and Dermabond applied. Wound closure is marginal. Further follow-up will be with urology. I do suspect he will have a small wrist at the midline of the scrotum once completely healed.

## 2015-07-18 ENCOUNTER — Telehealth: Payer: Self-pay | Admitting: Sports Medicine

## 2015-07-18 NOTE — Telephone Encounter (Signed)
Pt advised of recommendations.  Verbalized understanding.

## 2015-07-18 NOTE — Telephone Encounter (Signed)
Pt called office stating he went to Dr. Turner Danielsowan for a second opinion on his hip. Pt reports they are going to review his records but in the mean time Dr. Turner Danielsowan refuses to write any pain medications. Pt wants to know if PCP will. Will route for review.

## 2015-07-18 NOTE — Telephone Encounter (Signed)
We will continue the steady down-taper regimen we had discussed initially.

## 2015-08-19 ENCOUNTER — Ambulatory Visit: Payer: Commercial Managed Care - PPO | Admitting: Sports Medicine

## 2016-01-19 ENCOUNTER — Ambulatory Visit (INDEPENDENT_AMBULATORY_CARE_PROVIDER_SITE_OTHER): Payer: Self-pay | Admitting: Sports Medicine

## 2016-01-19 VITALS — BP 128/70 | HR 101 | Resp 18 | Wt 279.0 lb

## 2016-01-19 DIAGNOSIS — J449 Chronic obstructive pulmonary disease, unspecified: Secondary | ICD-10-CM

## 2016-01-19 DIAGNOSIS — I10 Essential (primary) hypertension: Secondary | ICD-10-CM

## 2016-01-19 DIAGNOSIS — Z9660 Presence of unspecified orthopedic joint implant: Secondary | ICD-10-CM

## 2016-01-19 DIAGNOSIS — Z96649 Presence of unspecified artificial hip joint: Secondary | ICD-10-CM

## 2016-01-19 DIAGNOSIS — F17200 Nicotine dependence, unspecified, uncomplicated: Secondary | ICD-10-CM

## 2016-01-19 DIAGNOSIS — Z720 Tobacco use: Secondary | ICD-10-CM

## 2016-01-19 MED ORDER — AMLODIPINE BESYLATE 10 MG PO TABS: 10.0000 mg | ORAL_TABLET | Freq: Every day | ORAL | Status: DC

## 2016-01-19 MED ORDER — LISINOPRIL-HYDROCHLOROTHIAZIDE 20-25 MG PO TABS: 1.0000 | ORAL_TABLET | Freq: Every day | ORAL | Status: DC

## 2016-01-19 NOTE — Assessment & Plan Note (Signed)
Continues to smoke, advised on cessation.

## 2016-01-19 NOTE — Progress Notes (Signed)
  Subjective:    CC: Follow-up  HPI: Smoker: Still smoking  COPD: Noncompliant with Breo  Hip arthroplasty: Dr. Turner Danielsowan suspects of the right arthroplasty is loose, he does have some disability paperwork to fill out today.  Hypertension: Controlled  Past medical history, Surgical history, Family history not pertinant except as noted below, Social history, Allergies, and medications have been entered into the medical record, reviewed, and no changes needed.   Review of Systems: No fevers, chills, night sweats, weight loss, chest pain, or shortness of breath.   Objective:    General: Well Developed, well nourished, and in no acute distress.  Neuro: Alert and oriented x3, extra-ocular muscles intact, sensation grossly intact.  HEENT: Normocephalic, atraumatic, pupils equal round reactive to light, neck supple, no masses, no lymphadenopathy, thyroid nonpalpable.  Skin: Warm and dry, no rashes. Cardiac: Regular rate and rhythm, no murmurs rubs or gallops, no lower extremity edema.  Respiratory: Clear to auscultation bilaterally. Not using accessory muscles, speaking in full sentences.  Impression and Recommendations:

## 2016-01-19 NOTE — Assessment & Plan Note (Signed)
Noncompliant with Breo, advised regular use and smoking cessation.

## 2016-01-19 NOTE — Assessment & Plan Note (Addendum)
No further intervention here. Dr. Turner Danielsowan thinks the hip is loose. Disability forms filled out.

## 2016-01-19 NOTE — Assessment & Plan Note (Signed)
Controlled, refilling medication 

## 2016-04-23 ENCOUNTER — Encounter (HOSPITAL_BASED_OUTPATIENT_CLINIC_OR_DEPARTMENT_OTHER): Payer: Self-pay | Admitting: Emergency Medicine

## 2016-04-23 ENCOUNTER — Emergency Department (HOSPITAL_BASED_OUTPATIENT_CLINIC_OR_DEPARTMENT_OTHER): Payer: Self-pay

## 2016-04-23 ENCOUNTER — Emergency Department (HOSPITAL_BASED_OUTPATIENT_CLINIC_OR_DEPARTMENT_OTHER)
Admission: EM | Admit: 2016-04-23 | Discharge: 2016-04-23 | Disposition: A | Discharge status: Home / Self Care | Payer: Self-pay | Attending: Emergency Medicine | Admitting: Emergency Medicine

## 2016-04-23 DIAGNOSIS — F17210 Nicotine dependence, cigarettes, uncomplicated: Secondary | ICD-10-CM | POA: Insufficient documentation

## 2016-04-23 DIAGNOSIS — Z79899 Other long term (current) drug therapy: Secondary | ICD-10-CM | POA: Insufficient documentation

## 2016-04-23 DIAGNOSIS — M25552 Pain in left hip: Secondary | ICD-10-CM | POA: Insufficient documentation

## 2016-04-23 DIAGNOSIS — I10 Essential (primary) hypertension: Secondary | ICD-10-CM | POA: Insufficient documentation

## 2016-04-23 DIAGNOSIS — J449 Chronic obstructive pulmonary disease, unspecified: Secondary | ICD-10-CM | POA: Insufficient documentation

## 2016-04-23 MED ORDER — OXYCODONE-ACETAMINOPHEN 5-325 MG PO TABS: 1.0000 | ORAL_TABLET | Freq: Four times a day (QID) | ORAL | 0 refills | Status: DC | PRN

## 2016-04-23 MED ORDER — OXYCODONE-ACETAMINOPHEN 5-325 MG PO TABS
2.0000 | ORAL_TABLET | Freq: Once | ORAL | Status: AC
  Administered 2016-04-23: 2 via ORAL
  Filled 2016-04-23: qty 2

## 2016-04-23 NOTE — ED Notes (Signed)
Back from xray

## 2016-04-23 NOTE — ED Provider Notes (Signed)
[facility] Provider Note   CSN: [id number] Arrival date & time: 04/23/16  0043     History   Chief Complaint Chief Complaint  Patient presents with  . Hip Pain    HPI Christopher Robertson is a 65 y.o. male with about a 3 week history of pain in the left hip. He localizes the pain to the left greater trochanter. The pain has gradually worsened. It is now moderate to severe, starting as a dull pain but becoming sharper at times. It is worse with ambulation or certain positions. It has been inadequately relieved by NSAIDs and acetaminophen. He is also feeling some muscle spasms in his left lateral calf.  He has had a right total hip arthroplasty.  HPI  Past Medical History:  Diagnosis Date  . COPD (chronic obstructive pulmonary disease) (HCC)   . History of concussion    yrs ago-- no residual  . Hypertension   . OA (osteoarthritis)   . Right hydrocele     Patient Active Problem List   Diagnosis Date Noted  . Insomnia 04/22/2015  . COPD, moderate (HCC) 01/16/2015  . Occult blood positive stool 01/31/2014  . 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 Hydrocele, right 08/18/2012  . Hypertension 08/18/2012  . Smoker 08/18/2012  . Preventive measure 08/18/2012    Past Surgical History:  Procedure Laterality Date  . FEMUR FRACTURE SURGERY  1985  . HYDROCELE EXCISION Right 05/23/2015   Procedure: RIGHT HYDROCELECTOMY ADULT;  Surgeon: Ihor GullyMark Ottelin, MD;  [facility] [place];  Service: Urology;  Laterality: Right;  . KNEE SURGERY Bilateral x5 last one 2003  . TOTAL HIP ARTHROPLASTY Right 02-28-2015       Home Medications    Prior to Admission medications   Medication Sig Start Date End Date Taking? Authorizing Provider  amLODipine (NORVASC) 10 MG tablet Take 1 tablet (10 mg total) by mouth daily. 01/19/16   Monica Bectonhomas J Thekkekandam, MD  Fluticasone Furoate-Vilanterol (BREO ELLIPTA) 100-25 MCG/INH AEPB Inhale 1 puff into the lungs  daily. Patient taking differently: Inhale 1 puff into the lungs every morning.  01/29/15   Monica Bectonhomas J Thekkekandam, MD  lisinopril-hydrochlorothiazide (PRINZIDE,ZESTORETIC) 20-25 MG tablet Take 1 tablet by mouth daily. 01/19/16   Monica Bectonhomas J Thekkekandam, MD  oxyCODONE-acetaminophen (PERCOCET) 5-325 MG tablet Take 1-2 tablets by mouth every 6 (six) hours as needed (hip pain). 04/23/16   Paula LibraJohn Sibbie Flammia, MD    Family History No family history on file.  Social History Social History  Substance Use Topics  . Smoking status: Current Every Day Smoker    Packs/day: 0.50    Years: 40.00    Types: Cigarettes  . Smokeless tobacco: Former NeurosurgeonUser    Types: Chew  . Alcohol use No     Allergies   Review of patient's allergies indicates no known allergies.   Review of Systems Review of Systems  All other systems reviewed and are negative.    Physical Exam Updated Vital Signs BP (!) 180/113 (BP Location: Right Arm)   Pulse 83   Temp 97.8 F (36.6 C) (Oral)   Ht 6\' 1"  (1.854 m)   Wt 280 lb (127 kg)   SpO2 99%   BMI 36.94 kg/m   Physical Exam General: Well-developed, well-nourished male in no acute distress; appearance consistent with age of record HENT: normocephalic; atraumatic Eyes: pupils equal, round and reactive to light; extraocular muscles intact Neck: supple Heart: regular rate and rhythm Lungs: clear to auscultation bilaterally Abdomen: soft; nondistended;  nontender; bowel sounds present Extremities: No deformity; full range of motion; pulses normal; tenderness over left greater trochanter; no significant pain on passive range of motion of left hip; antalgic gait Neurologic: Awake, alert and oriented; motor function intact in all extremities and symmetric; no facial droop Skin: Warm and dry Psychiatric: Normal mood and affect    ED Treatments / Results  Nursing notes and vitals signs, including pulse oximetry, reviewed.  Summary of this visit's results, reviewed by  myself:  Imaging Studies: Dg Hip Unilat W Or Wo Pelvis 2-3 Views Left  Result Date: 04/23/2016 CLINICAL DATA:  Left hip/ greater trochanteric pain for 3 weeks. No known injury. EXAM: DG HIP (WITH OR WITHOUT PELVIS) 2-3V LEFT COMPARISON:  Pelvis and right hip radiographs 06/23/2015 FINDINGS: Right hip total arthroplasty in expected alignment. No periprosthetic lucency. Mild degenerative change of the left hip. Significant joint space narrowing. Pubic rami are intact. Pubic symphysis is congruent. Tiny enthesophyte adjacent to the greater trochanter. There are vascular calcifications. IMPRESSION: Mild degenerative change of the left hip, no acute bony abnormality. Tiny enthesophyte adjacent to the left greater trochanter. Electronically Signed   By: Rubye Oaks M.D.   On: 04/23/2016 02:42     Procedures (including critical care time)   Final Clinical Impressions(s) / ED Diagnoses   Final diagnoses:  Left hip pain       Paula Libra, MD 04/23/16 [phone]

## 2016-04-23 NOTE — ED Triage Notes (Addendum)
Hip pain x 3 weeks that has continued to get worse and affects sleep and ambulation.  Taken advil 600 mg this am, 800 mg tylenol @ 5pm, and 30 mg meloxicam, as well as regular meds.

## 2016-04-23 NOTE — ED Notes (Signed)
Pt to xray via stretcher, [facility] Pt alert, NAD, calm, interactive.

## 2016-07-06 ENCOUNTER — Emergency Department (HOSPITAL_BASED_OUTPATIENT_CLINIC_OR_DEPARTMENT_OTHER): Payer: No Typology Code available for payment source

## 2016-07-06 ENCOUNTER — Encounter (HOSPITAL_BASED_OUTPATIENT_CLINIC_OR_DEPARTMENT_OTHER): Payer: Self-pay | Admitting: Emergency Medicine

## 2016-07-06 ENCOUNTER — Emergency Department (HOSPITAL_BASED_OUTPATIENT_CLINIC_OR_DEPARTMENT_OTHER)
Admission: EM | Admit: 2016-07-06 | Discharge: 2016-07-06 | Disposition: A | Discharge status: Home / Self Care | Payer: No Typology Code available for payment source | Attending: Emergency Medicine | Admitting: Emergency Medicine

## 2016-07-06 DIAGNOSIS — J449 Chronic obstructive pulmonary disease, unspecified: Secondary | ICD-10-CM | POA: Insufficient documentation

## 2016-07-06 DIAGNOSIS — Y92481 Parking lot as the place of occurrence of the external cause: Secondary | ICD-10-CM | POA: Diagnosis not present

## 2016-07-06 DIAGNOSIS — S161XXA Strain of muscle, fascia and tendon at neck level, initial encounter: Secondary | ICD-10-CM | POA: Diagnosis not present

## 2016-07-06 DIAGNOSIS — Y999 Unspecified external cause status: Secondary | ICD-10-CM | POA: Insufficient documentation

## 2016-07-06 DIAGNOSIS — S199XXA Unspecified injury of neck, initial encounter: Secondary | ICD-10-CM | POA: Diagnosis present

## 2016-07-06 DIAGNOSIS — I10 Essential (primary) hypertension: Secondary | ICD-10-CM | POA: Diagnosis not present

## 2016-07-06 DIAGNOSIS — Z79899 Other long term (current) drug therapy: Secondary | ICD-10-CM | POA: Diagnosis not present

## 2016-07-06 DIAGNOSIS — F17210 Nicotine dependence, cigarettes, uncomplicated: Secondary | ICD-10-CM | POA: Diagnosis not present

## 2016-07-06 DIAGNOSIS — Y9389 Activity, other specified: Secondary | ICD-10-CM | POA: Insufficient documentation

## 2016-07-06 MED ORDER — NAPROXEN 250 MG PO TABS
500.0000 mg | ORAL_TABLET | Freq: Once | ORAL | Status: AC
  Administered 2016-07-06: 500 mg via ORAL
  Filled 2016-07-06: qty 2
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
## 2016-07-06 NOTE — ED Notes (Signed)
Patient transported to X-ray 

## 2016-07-06 NOTE — ED Notes (Signed)
ED Provider at bedside. 

## 2016-07-06 NOTE — Discharge Instructions (Signed)
Your Xrays are negative for fracture. Followup with your doctor for an ultrasound of your aorta. Return to the ED if you develop new or worsening symptoms.
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
## 2016-07-26 ENCOUNTER — Other Ambulatory Visit: Payer: Self-pay | Admitting: Sports Medicine

## 2016-07-26 DIAGNOSIS — J449 Chronic obstructive pulmonary disease, unspecified: Secondary | ICD-10-CM

## 2016-07-26 DIAGNOSIS — R0602 Shortness of breath: Secondary | ICD-10-CM

## 2017-08-02 ENCOUNTER — Ambulatory Visit (INDEPENDENT_AMBULATORY_CARE_PROVIDER_SITE_OTHER): Payer: Medicare Other | Admitting: Sports Medicine

## 2017-08-02 ENCOUNTER — Ambulatory Visit (INDEPENDENT_AMBULATORY_CARE_PROVIDER_SITE_OTHER): Payer: Medicare Other

## 2017-08-02 ENCOUNTER — Encounter: Payer: Self-pay | Admitting: Sports Medicine

## 2017-08-02 DIAGNOSIS — Z299 Encounter for prophylactic measures, unspecified: Secondary | ICD-10-CM

## 2017-08-02 DIAGNOSIS — M48061 Spinal stenosis, lumbar region without neurogenic claudication: Secondary | ICD-10-CM | POA: Diagnosis not present

## 2017-08-02 DIAGNOSIS — M5416 Radiculopathy, lumbar region: Secondary | ICD-10-CM

## 2017-08-02 DIAGNOSIS — F329 Major depressive disorder, single episode, unspecified: Secondary | ICD-10-CM | POA: Insufficient documentation

## 2017-08-02 DIAGNOSIS — I10 Essential (primary) hypertension: Secondary | ICD-10-CM | POA: Diagnosis not present

## 2017-08-02 DIAGNOSIS — F32A Depression, unspecified: Secondary | ICD-10-CM | POA: Insufficient documentation

## 2017-08-02 DIAGNOSIS — R413 Other amnesia: Secondary | ICD-10-CM

## 2017-08-02 DIAGNOSIS — Z23 Encounter for immunization: Secondary | ICD-10-CM

## 2017-08-02 DIAGNOSIS — F419 Anxiety disorder, unspecified: Secondary | ICD-10-CM | POA: Insufficient documentation

## 2017-08-02 DIAGNOSIS — M545 Low back pain: Secondary | ICD-10-CM | POA: Diagnosis not present

## 2017-08-02 MED ORDER — MELOXICAM 15 MG PO TABS: ORAL_TABLET | ORAL | 3 refills | Status: DC

## 2017-08-02 MED ORDER — DULOXETINE HCL 30 MG PO CPEP: 30.0000 mg | ORAL_CAPSULE | Freq: Every day | ORAL | 3 refills | Status: DC

## 2017-08-02 MED ORDER — LISINOPRIL-HYDROCHLOROTHIAZIDE 20-25 MG PO TABS
1.0000 | ORAL_TABLET | Freq: Every day | ORAL | 3 refills | Status: DC
Start: 2017-08-02 — End: 2017-12-15

## 2017-08-02 MED ORDER — PREDNISONE 50 MG PO TABS: ORAL_TABLET | ORAL | 0 refills | Status: DC

## 2017-08-02 NOTE — Assessment & Plan Note (Signed)
Prednisone, x-rays, physical therapy. Return in 1 month, MR for interventional planning if no better.

## 2017-08-02 NOTE — Assessment & Plan Note (Signed)
With imbalance. Suspect uncontrolled depression, but also with severe weight loss, there may be a vitamin deficiency. Starting Cymbalta 30. No incontinence. Checking B12 and folate.

## 2017-08-02 NOTE — Assessment & Plan Note (Addendum)
Restarting medications, return in 1 month, checking blood work. Due to his weight loss we are not going to start all of his antihypertensive.

## 2017-08-02 NOTE — Progress Notes (Signed)
  Subjective:    CC: Get caught up on preventive measures  HPI: Hypertension: Uncontrolled, has been off of medication, no headaches, visual changes, chest pain  Back and leg pain: Radiating down the right leg, worse with sitting, flexion, Valsalva, no bowel or bladder dysfunction, saddle numbness, no constitutional symptoms.  Memory loss: With depression, significant weight loss, feeling off balance.  No trauma.  Past medical history:  Negative.  See flowsheet/record as well for more information.  Surgical history: Negative.  See flowsheet/record as well for more information.  Family history: Negative.  See flowsheet/record as well for more information.  Social history: Negative.  See flowsheet/record as well for more information.  Allergies, and medications have been entered into the medical record, reviewed, and no changes needed.   Review of Systems: No fevers, chills, night sweats, weight loss, chest pain, or shortness of breath.   Objective:    General: Well Developed, well nourished, and in no acute distress.  Neuro: Alert and oriented x3, extra-ocular muscles intact, sensation grossly intact.  HEENT: Normocephalic, atraumatic, pupils equal round reactive to light, neck supple, no masses, no lymphadenopathy, thyroid nonpalpable.  Skin: Warm and dry, no rashes. Cardiac: Regular rate and rhythm, no murmurs rubs or gallops, no lower extremity edema.  Respiratory: Clear to auscultation bilaterally. Not using accessory muscles, speaking in full sentences.  Impression and Recommendations:    Preventive measure Adding routine blood work, refilling hypertensive medications. Referral for colonoscopy.  Hypertension Restarting medications, return in 1 month, checking blood work. Due to his weight loss we are not going to start all of his antihypertensive.  Right lumbar radiculitis Prednisone, x-rays, physical therapy. Return in 1 month, MR for interventional planning if no  better.  Memory loss With imbalance. Suspect uncontrolled depression, but also with severe weight loss, there may be a vitamin deficiency. Starting Cymbalta 30. No incontinence. Checking B12 and folate.  I spent 40 minutes with this patient, greater than 50% was face-to-face time counseling regarding the above diagnoses ___________________________________________ Ihor Austinhomas J. Benjamin Stainhekkekandam, M.D., ABFM., CAQSM. Primary Care and Sports Medicine [facility]  Adjunct Instructor of Family Medicine  [employer]

## 2017-08-02 NOTE — Assessment & Plan Note (Addendum)
Adding routine blood work, refilling hypertensive medications. Referral for colonoscopy.

## 2017-08-03 LAB — COMPREHENSIVE METABOLIC PANEL
AG Ratio: 1.4 (calc) (ref 1.0–2.5)
ALT: 10 U/L (ref 9–46)
AST: 11 U/L (ref 10–35)
Albumin: 4.2 g/dL (ref 3.6–5.1)
Alkaline phosphatase (APISO): 79 U/L (ref 40–115)
BUN: 13 mg/dL (ref 7–25)
CO2: 26 mmol/L (ref 20–32)
Calcium: 9.9 mg/dL (ref 8.6–10.3)
Chloride: 105 mmol/L (ref 98–110)
Creat: 0.86 mg/dL (ref 0.70–1.25)
Globulin: 3 g/dL (calc) (ref 1.9–3.7)
Glucose, Bld: 113 mg/dL — ABNORMAL HIGH (ref 65–99)
Potassium: 4.3 mmol/L (ref 3.5–5.3)
Sodium: 140 mmol/L (ref 135–146)
Total Bilirubin: 1 mg/dL (ref 0.2–1.2)
Total Protein: 7.2 g/dL (ref 6.1–8.1)

## 2017-08-03 LAB — LIPID PANEL W/REFLEX DIRECT LDL
Cholesterol: 159 mg/dL (ref <200)
HDL: 47 mg/dL (ref >40)
LDL Cholesterol (Calc): 93 mg/dL (calc)
Non-HDL Cholesterol (Calc): 112 mg/dL (calc) (ref <130)
Total CHOL/HDL Ratio: 3.4 (calc) (ref <5.0)
Triglycerides: 99 mg/dL (ref <150)

## 2017-08-03 LAB — HEPATITIS C ANTIBODY
Hepatitis C Ab: NONREACTIVE
SIGNAL TO CUT-OFF: 0.04 (ref <1.00)

## 2017-08-03 LAB — CBC
HCT: 48.1 % (ref 38.5–50.0)
Hemoglobin: 16.9 g/dL (ref 13.2–17.1)
MCH: 30.5 pg (ref 27.0–33.0)
MCHC: 35.1 g/dL (ref 32.0–36.0)
MCV: 86.7 fL (ref 80.0–100.0)
MPV: 9.7 fL (ref 7.5–12.5)
Platelets: 338 10*3/uL (ref 140–400)
RBC: 5.55 10*6/uL (ref 4.20–5.80)
RDW: 12 % (ref 11.0–15.0)
WBC: 8.6 10*3/uL (ref 3.8–10.8)

## 2017-08-03 LAB — HEMOGLOBIN A1C
Hgb A1c MFr Bld: 6.2 % of total Hgb — ABNORMAL HIGH (ref <5.7)
Mean Plasma Glucose: 131 (calc)
eAG (mmol/L): 7.3 (calc)

## 2017-08-03 LAB — TSH: TSH: 1.63 mIU/L (ref 0.40–4.50)

## 2017-08-03 LAB — HIV ANTIBODY (ROUTINE TESTING W REFLEX): HIV 1&2 Ab, 4th Generation: NONREACTIVE

## 2017-08-03 LAB — VITAMIN B12: Vitamin B-12: 554 pg/mL (ref 200–1100)

## 2017-08-03 LAB — FOLATE: Folate: 8.5 ng/mL

## 2017-08-03 LAB — RPR: RPR Ser Ql: NONREACTIVE

## 2017-09-06 ENCOUNTER — Encounter: Payer: Self-pay | Admitting: Sports Medicine

## 2017-09-06 ENCOUNTER — Ambulatory Visit (INDEPENDENT_AMBULATORY_CARE_PROVIDER_SITE_OTHER): Payer: Medicare Other | Admitting: Sports Medicine

## 2017-09-06 DIAGNOSIS — R413 Other amnesia: Secondary | ICD-10-CM | POA: Diagnosis not present

## 2017-09-06 DIAGNOSIS — I10 Essential (primary) hypertension: Secondary | ICD-10-CM | POA: Diagnosis not present

## 2017-09-06 DIAGNOSIS — Z23 Encounter for immunization: Secondary | ICD-10-CM | POA: Diagnosis not present

## 2017-09-06 MED ORDER — AMLODIPINE BESYLATE 10 MG PO TABS: 5.0000 mg | ORAL_TABLET | Freq: Every day | ORAL | 11 refills | Status: DC

## 2017-09-06 MED ORDER — DULOXETINE HCL 60 MG PO CPEP: 60.0000 mg | ORAL_CAPSULE | Freq: Every day | ORAL | 3 refills | Status: DC

## 2017-09-06 NOTE — Assessment & Plan Note (Signed)
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 He is starting to gain some weight, increasing Cymbalta to 60 mg. Return to see me in 4 weeks. He does have some imbalance as well, dementia panel workup was negative.

## 2017-09-06 NOTE — Assessment & Plan Note (Addendum)
Blood pressure now well controlled on recheck.  No new medications on recheck.

## 2017-09-06 NOTE — Progress Notes (Signed)
  Subjective:    CC: Blood pressure  HPI: This is a pleasant 67 year old male, he returns for follow-up of his hypertension, blood pressure is normal today.  Mood disorder: Associated with some imbalance and difficulty with memory, he was severely depressed and lost a lot of weight, symptoms have improved slightly with low-dose Cymbalta.  He has gained some weight again, no suicidal or homicidal ideation.  I reviewed the past medical history, family history, social history, surgical history, and allergies today and no changes were needed.  Please see the problem list section below in epic for further details.  Past Medical History: Past Medical History:  Diagnosis Date  . COPD (chronic obstructive pulmonary disease) (HCC)   . History of concussion    yrs ago-- no residual  . Hypertension   . OA (osteoarthritis)   . Right hydrocele    Past Surgical History: Past Surgical History:  Procedure Laterality Date  . FEMUR FRACTURE SURGERY  1985  . HYDROCELE EXCISION Right 05/23/2015   Procedure: RIGHT HYDROCELECTOMY ADULT;  Surgeon: Ihor GullyMark Ottelin, MD;  [facility] [place];  Service: Urology;  Laterality: Right;  . KNEE SURGERY Bilateral x5 last one 2003  . TOTAL HIP ARTHROPLASTY Right 02-28-2015   Social History: Social History   Socioeconomic History  . Marital status: Married    Spouse name: None  . Number of children: None  . Years of education: None  . Highest education level: None  Social Needs  . Financial resource strain: None  . Food insecurity - worry: None  . Food insecurity - inability: None  . Transportation needs - medical: None  . Transportation needs - non-medical: None  Occupational History  . None  Tobacco Use  . Smoking status: Current Every Day Smoker    Packs/day: 0.50    Years: 40.00    Pack years: 20.00    Types: Cigarettes  . Smokeless tobacco: Former NeurosurgeonUser    Types: Chew  Substance and Sexual Activity  . Alcohol use: No  . Drug  use: No  . Sexual activity: None  Other Topics Concern  . None  Social History Narrative  . None   Family History: No family history on file. Allergies: No Known Allergies Medications: See med rec.  Review of Systems: No fevers, chills, night sweats, weight loss, chest pain, or shortness of breath.   Objective:    General: Well Developed, well nourished, and in no acute distress.  Neuro: Alert and oriented x3, extra-ocular muscles intact, sensation grossly intact.  HEENT: Normocephalic, atraumatic, pupils equal round reactive to light, neck supple, no masses, no lymphadenopathy, thyroid nonpalpable.  Skin: Warm and dry, no rashes. Cardiac: Regular rate and rhythm, no murmurs rubs or gallops, no lower extremity edema.  Respiratory: Clear to auscultation bilaterally. Not using accessory muscles, speaking in full sentences.  Impression and Recommendations:    Hypertension Blood pressure now well controlled on recheck.  No new medications on recheck.  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 He is starting to gain some weight, increasing Cymbalta to 60 mg. Return to see me in 4 weeks. He does have some imbalance as well, dementia panel workup was negative.  I spent 25 minutes with this patient, greater than 50% was face-to-face time counseling regarding the above diagnoses ___________________________________________ Ihor Austinhomas J. Benjamin Stainhekkekandam, M.D., ABFM., CAQSM. Primary Care and Sports Medicine [facility]  Adjunct Instructor of Family Medicine  [employer]

## 2017-09-20 ENCOUNTER — Ambulatory Visit (INDEPENDENT_AMBULATORY_CARE_PROVIDER_SITE_OTHER): Payer: Medicare Other | Admitting: Sports Medicine

## 2017-09-20 VITALS — BP 114/77 | HR 96 | Wt 231.0 lb

## 2017-09-20 DIAGNOSIS — I10 Essential (primary) hypertension: Secondary | ICD-10-CM

## 2017-09-20 NOTE — Progress Notes (Signed)
   Subjective:    Patient ID: Christopher Robertson, male    DOB: 1951/04/10, 67 y.o.   MRN: [id number]  HPI  Christopher Robertson is here for a blood pressure check. Denies chest pain, shortness of breath, dizziness or headaches.   Review of Systems     Objective:   Physical Exam        Assessment & Plan:  HTN- Patient's blood pressure was within normal limits. Patient advised to continue current medications. Follow up as directed.

## 2017-10-04 ENCOUNTER — Ambulatory Visit (INDEPENDENT_AMBULATORY_CARE_PROVIDER_SITE_OTHER): Payer: Medicare Other | Admitting: Sports Medicine

## 2017-10-04 ENCOUNTER — Encounter: Payer: Self-pay | Admitting: Sports Medicine

## 2017-10-04 DIAGNOSIS — H8190 Unspecified disorder of vestibular function, unspecified ear: Secondary | ICD-10-CM | POA: Insufficient documentation

## 2017-10-04 DIAGNOSIS — R413 Other amnesia: Secondary | ICD-10-CM

## 2017-10-04 DIAGNOSIS — H832X9 Labyrinthine dysfunction, unspecified ear: Secondary | ICD-10-CM | POA: Insufficient documentation

## 2017-10-04 DIAGNOSIS — F419 Anxiety disorder, unspecified: Secondary | ICD-10-CM

## 2017-10-04 DIAGNOSIS — F329 Major depressive disorder, single episode, unspecified: Secondary | ICD-10-CM

## 2017-10-04 DIAGNOSIS — H8193 Unspecified disorder of vestibular function, bilateral: Secondary | ICD-10-CM

## 2017-10-04 DIAGNOSIS — H832X3 Labyrinthine dysfunction, bilateral: Secondary | ICD-10-CM

## 2017-10-04 DIAGNOSIS — F32A Depression, unspecified: Secondary | ICD-10-CM

## 2017-10-04 MED ORDER — DULOXETINE HCL 60 MG PO CPEP: 120.0000 mg | ORAL_CAPSULE | Freq: Every day | ORAL | 3 refills | Status: DC

## 2017-10-04 NOTE — Assessment & Plan Note (Signed)
Increasing Cymbalta to 120 mg. Return in 1 month for PHQ/GAD
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
## 2017-10-04 NOTE — Assessment & Plan Note (Signed)
Unclear as to whether this represents benign positional vertigo, versus central vertiginous symptoms. He does have a history of uncontrolled hypertension long-standing, as well as failure of thought processes. I do think he needs advanced imaging, MRI brain with and without contrast. I also like him to have a second opinion from neurology.

## 2017-10-05 ENCOUNTER — Other Ambulatory Visit: Payer: Self-pay | Admitting: Sports Medicine

## 2017-10-05 DIAGNOSIS — I10 Essential (primary) hypertension: Secondary | ICD-10-CM

## 2017-10-05 NOTE — Progress Notes (Signed)
Lab order placed per radiology protocol.  

## 2017-10-10 ENCOUNTER — Ambulatory Visit (INDEPENDENT_AMBULATORY_CARE_PROVIDER_SITE_OTHER): Payer: Medicare Other | Admitting: Neurology

## 2017-10-10 ENCOUNTER — Encounter: Payer: Self-pay | Admitting: Neurology

## 2017-10-10 VITALS — BP 170/92 | HR 81 | Ht 73.0 in | Wt 236.5 lb

## 2017-10-10 DIAGNOSIS — R2689 Other abnormalities of gait and mobility: Secondary | ICD-10-CM

## 2017-10-10 DIAGNOSIS — R413 Other amnesia: Secondary | ICD-10-CM | POA: Diagnosis not present

## 2017-10-10 DIAGNOSIS — G3281 Cerebellar ataxia in diseases classified elsewhere: Secondary | ICD-10-CM | POA: Diagnosis not present
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
## 2017-10-11 ENCOUNTER — Telehealth: Payer: Self-pay | Admitting: Sports Medicine

## 2017-10-11 NOTE — Telephone Encounter (Signed)
If neuro ordered the same test as Dr T with an additional, I would schedule the ones from neuro.

## 2017-10-11 NOTE — Telephone Encounter (Signed)
-----   Message from Eugene Garnetarolyn S Chandler sent at 10/11/2017 10:26 AM EST ----- Regarding: mri We never got in touch with this patient.  Now his has MRI orders for  Brain and cervical  from a Dr. Glenna DurandYin.      Thanks, Eber Jonesarolyn

## 2017-10-31 ENCOUNTER — Ambulatory Visit
Admission: RE | Admit: 2017-10-31 | Discharge: 2017-10-31 | Disposition: A | Discharge status: Home / Self Care | Payer: Medicare Other | Source: Ambulatory Visit | Attending: Neurology | Admitting: Neurology

## 2017-10-31 ENCOUNTER — Telehealth: Payer: Self-pay | Admitting: Neurology

## 2017-10-31 DIAGNOSIS — R413 Other amnesia: Secondary | ICD-10-CM

## 2017-10-31 DIAGNOSIS — G3281 Cerebellar ataxia in diseases classified elsewhere: Secondary | ICD-10-CM

## 2017-10-31 NOTE — Telephone Encounter (Signed)
Please call patient, MRI of the cervical spine showed multilevel degenerative changes, there is no evidence of spinal cord or spinal nerve root compression  MRI of the brain showed no significant abnormality  IMPRESSION:  This MRI of the cervical spine without contrast shows the following: 1.    Multilevel degenerative changes as detailed above. There is mild spinal stenosis at C4-C5 and C6-C7 and borderline spinal stenosis at C5-C6. Additionally, at C4-C5 there is moderately severe left foraminal narrowing that could lead to left C5 nerve root compression.   There is edema within the endplates at C4-C5 and C5-C6. 2.    The spinal cord appears normal.

## 2017-11-01 ENCOUNTER — Encounter: Payer: Self-pay | Admitting: Sports Medicine

## 2017-11-01 ENCOUNTER — Ambulatory Visit (INDEPENDENT_AMBULATORY_CARE_PROVIDER_SITE_OTHER): Payer: Medicare Other | Admitting: Sports Medicine

## 2017-11-01 DIAGNOSIS — I10 Essential (primary) hypertension: Secondary | ICD-10-CM | POA: Diagnosis not present

## 2017-11-01 DIAGNOSIS — F32A Depression, unspecified: Secondary | ICD-10-CM

## 2017-11-01 DIAGNOSIS — F329 Major depressive disorder, single episode, unspecified: Secondary | ICD-10-CM

## 2017-11-01 DIAGNOSIS — F419 Anxiety disorder, unspecified: Secondary | ICD-10-CM

## 2017-11-01 DIAGNOSIS — R413 Other amnesia: Secondary | ICD-10-CM

## 2017-11-01 DIAGNOSIS — R2689 Other abnormalities of gait and mobility: Secondary | ICD-10-CM | POA: Diagnosis not present

## 2017-11-01 LAB — URINALYSIS
Bilirubin Urine: NEGATIVE
Glucose, UA: NEGATIVE
Hgb urine dipstick: NEGATIVE
Ketones, ur: NEGATIVE
Leukocytes, UA: NEGATIVE
Nitrite: NEGATIVE
Protein, ur: NEGATIVE
Specific Gravity, Urine: 1.026 (ref 1.001–1.03)
pH: 5.5 (ref 5.0–8.0)

## 2017-11-01 MED ORDER — DONEPEZIL HCL 10 MG PO TABS: 5.0000 mg | ORAL_TABLET | Freq: Every evening | ORAL | 3 refills | Status: DC | PRN

## 2017-11-01 NOTE — Assessment & Plan Note (Signed)
Persistence of symptoms after increasing Cymbalta to 120, we are going to take a different approach.

## 2017-11-01 NOTE — Assessment & Plan Note (Signed)
Uncontrolled but patient admits noncompliance with his medications, no headaches, visual change, chest pain.

## 2017-11-01 NOTE — Progress Notes (Signed)
Subjective:    CC: Multitude of symptoms  HPI: We have been comanaging his loss of balance with neurology.  He had a brain and cervical spine MRI that were unrevealing.  Serum workup including vitamin B12 levels, RPR, as well as routine labs that were unremarkable.  We have been treating his depression, increased to 120 of Cymbalta at the last visit which also has not controlled his symptoms.  His principal issue right now is occasionally feeling as though he is absent-minded, mild cognitive impairment, but severe loss of balance.  He has some numbness in his right leg and right great toe, feels as though he could not stand on one foot.  Symptoms are worsening.  Hypertension: Patient is uncontrolled, no current headaches, visual changes, chest pain, admits to missing multiple doses of his medication.  I reviewed the past medical history, family history, social history, surgical history, and allergies today and no changes were needed.  Please see the problem list section below in epic for further details.  Past Medical History: Past Medical History:  Diagnosis Date  . COPD (chronic obstructive pulmonary disease) (HCC)   . History of concussion    yrs ago-- no residual  . Hypertension   . OA (osteoarthritis)   . Right hydrocele    Past Surgical History: Past Surgical History:  Procedure Laterality Date  . FEMUR FRACTURE SURGERY  1985  . HYDROCELE EXCISION Right 05/23/2015   Procedure: RIGHT HYDROCELECTOMY ADULT;  Surgeon: Ihor GullyMark Ottelin, MD;  [facility];  Service: Urology;  Laterality: Right;  . KNEE SURGERY Bilateral x5 last one 2003  . TOTAL HIP ARTHROPLASTY Right 02-28-2015   Social History: Social History   Socioeconomic History  . Marital status: Married    Spouse name: None  . Number of children: None  . Years of education: None  . Highest education level: None  Social Needs  . Financial resource strain: None  . Food insecurity - worry: None  .  Food insecurity - inability: None  . Transportation needs - medical: None  . Transportation needs - non-medical: None  Occupational History  . None  Tobacco Use  . Smoking status: Current Every Day Smoker    Packs/day: 0.50    Years: 40.00    Pack years: 20.00    Types: Cigarettes  . Smokeless tobacco: Former NeurosurgeonUser    Types: Chew  Substance and Sexual Activity  . Alcohol use: No  . Drug use: No  . Sexual activity: None  Other Topics Concern  . None  Social History Narrative  . None   Family History: Family History  Problem Relation Age of Onset  . Dementia Mother   . Colon cancer Father   . Cancer Paternal Grandmother    Allergies: No Known Allergies Medications: See med rec.  Review of Systems: No fevers, chills, night sweats, weight loss, chest pain, or shortness of breath.   Objective:    General: Well Developed, well nourished, and in no acute distress.  Neuro: Alert and oriented x3, extra-ocular muscles intact, sensation grossly intact.  HEENT: Normocephalic, atraumatic, pupils equal round reactive to light, neck supple, no masses, no lymphadenopathy, thyroid nonpalpable.  Skin: Warm and dry, no rashes. Cardiac: Regular rate and rhythm, no murmurs rubs or gallops, no lower extremity edema.  Respiratory: Clear to auscultation bilaterally. Not using accessory muscles, speaking in full sentences. Ortho: Ambulates with a very unsteady gait, somewhat ataxic.  Impression and Recommendations:    Anxiety and depression Persistence  of symptoms after increasing Cymbalta to 120, we are going to take a different approach.  Balance disorder Unclear etiology at this point, negative serum workup. He has discussed this with neurology. He has lost proprioception in his lower extremities, cervical and brain MRI did not reveal a cause. Adding thoracic and lumbar spine MRI, lower extremity nerve conduction studies. He does need to keep close follow-up with neurology regarding  this.  Memory loss Unclear etiology, adding low-dose Aricept. Adding urinalysis.   Hypertension Uncontrolled but patient admits noncompliance with his medications, no headaches, visual change, chest pain.  I spent 40 minutes with this patient, greater than 50% was face-to-face time counseling regarding the above diagnoses ___________________________________________ Ihor Austin. Benjamin Stain, M.D., ABFM., CAQSM. Primary Care and Sports Medicine  [facility]  Adjunct Instructor of Family Medicine  [employer]

## 2017-11-01 NOTE — Assessment & Plan Note (Signed)
Unclear etiology, adding low-dose Aricept. Adding urinalysis.

## 2017-11-01 NOTE — Assessment & Plan Note (Signed)
Unclear etiology at this point, negative serum workup. He has discussed this with neurology. He has lost proprioception in his lower extremities, cervical and brain MRI did not reveal a cause. Adding thoracic and lumbar spine MRI, lower extremity nerve conduction studies. He does need to keep close follow-up with neurology regarding this.

## 2017-11-02 LAB — URINE CULTURE
MICRO NUMBER:: 90281541
Result:: NO GROWTH
SPECIMEN QUALITY:: ADEQUATE

## 2017-11-03 NOTE — Telephone Encounter (Signed)
Levert FeinsteinYan, Yijun, MD  Rocky LinkGann, Diogo Anne, CMA        Please call pt for MRI of the brain showed age-related changes there is no acute abnormality.

## 2017-11-11 NOTE — Telephone Encounter (Signed)
RN call patient about his MRI brain results, and MRI cervical spine. The pt was not available. Richelle ItoFranike pts wife on dpr stated she can take the message. RN stated the MRI of brain showed age related changes, no acute abnormality. The MRI cervical spine showed no evidence of spinal cord or spinal nerve root compression, it showed multilevel degenerative changes. The wife verbalized understanding,and will tell her husband.

## 2017-11-15 ENCOUNTER — Telehealth: Payer: Self-pay | Admitting: Neurology

## 2017-11-15 NOTE — Telephone Encounter (Signed)
Closing this referral, called several times and sent a letter  .  Patient is not responding to any physical therapy calls. I called and left patient a message as Well.

## 2017-11-16 ENCOUNTER — Telehealth: Payer: Self-pay | Admitting: Sports Medicine

## 2017-11-16 NOTE — Telephone Encounter (Signed)
Attempted to contact Pt, no answer and no VM. 

## 2017-11-16 NOTE — Telephone Encounter (Signed)
-----   Message from Christopher Robertson sent at 11/15/2017  3:00 PM EDT ----- Regarding: mri We have left three messages for this patient to schedule his MRI's.   No return phone calls.  Thanks, Eber Jonesarolyn

## 2017-11-29 ENCOUNTER — Ambulatory Visit: Payer: Medicare Other | Admitting: Sports Medicine

## 2017-11-30 ENCOUNTER — Encounter: Payer: Self-pay | Admitting: Neurology

## 2017-11-30 ENCOUNTER — Ambulatory Visit (INDEPENDENT_AMBULATORY_CARE_PROVIDER_SITE_OTHER): Payer: Medicare Other | Admitting: Neurology

## 2017-11-30 ENCOUNTER — Telehealth: Payer: Self-pay | Admitting: Neurology

## 2017-11-30 VITALS — BP 173/95 | HR 74 | Ht 73.0 in | Wt 248.5 lb

## 2017-11-30 DIAGNOSIS — G3281 Cerebellar ataxia in diseases classified elsewhere: Secondary | ICD-10-CM | POA: Diagnosis not present

## 2017-11-30 DIAGNOSIS — R269 Unspecified abnormalities of gait and mobility: Secondary | ICD-10-CM

## 2017-11-30 NOTE — Telephone Encounter (Signed)
Medicare order sent to GI no auth they will reach out to the pt to schedule.
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
## 2017-12-01 ENCOUNTER — Ambulatory Visit (INDEPENDENT_AMBULATORY_CARE_PROVIDER_SITE_OTHER): Payer: Medicare Other | Admitting: Sports Medicine

## 2017-12-01 ENCOUNTER — Encounter: Payer: Self-pay | Admitting: Sports Medicine

## 2017-12-01 DIAGNOSIS — R413 Other amnesia: Secondary | ICD-10-CM | POA: Diagnosis not present

## 2017-12-01 DIAGNOSIS — I10 Essential (primary) hypertension: Secondary | ICD-10-CM | POA: Diagnosis not present

## 2017-12-01 MED ORDER — AMLODIPINE BESYLATE 5 MG PO TABS: 5.0000 mg | ORAL_TABLET | Freq: Every day | ORAL | 3 refills | Status: DC

## 2017-12-01 MED ORDER — DONEPEZIL HCL 10 MG PO TABS: 5.0000 mg | ORAL_TABLET | Freq: Every evening | ORAL | 3 refills | Status: DC | PRN

## 2017-12-01 NOTE — Assessment & Plan Note (Addendum)
With memory loss, ataxic gait. Currently being evaluated by neurology, nothing found on brain MRI that could be the cause, he does have some multilevel cervical spinal stenosis but no cord compression, this is an unlikely cause. Awaiting thoracic and lumbar spine MRIs, nerve conduction/EMG. He also forgot to start his Aricept which will help his memory. Calling this in again. Suspect mild cognitive impairment versus early dementia versus dementia pugilistica.

## 2017-12-01 NOTE — Assessment & Plan Note (Signed)
Blood pressure is still elevated, adding amlodipine 10. Return in 2 weeks in a nurse visit blood pressure check.
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
## 2017-12-10 ENCOUNTER — Ambulatory Visit
Admission: RE | Admit: 2017-12-10 | Discharge: 2017-12-10 | Disposition: A | Discharge status: Home / Self Care | Payer: Medicare Other | Source: Ambulatory Visit | Attending: Neurology | Admitting: Neurology

## 2017-12-10 DIAGNOSIS — G3281 Cerebellar ataxia in diseases classified elsewhere: Secondary | ICD-10-CM

## 2017-12-13 ENCOUNTER — Telehealth: Payer: Self-pay | Admitting: Neurology

## 2017-12-13 ENCOUNTER — Encounter (INDEPENDENT_AMBULATORY_CARE_PROVIDER_SITE_OTHER): Payer: Self-pay

## 2017-12-13 NOTE — Telephone Encounter (Signed)
Please call patient, MRI thoracic showed mild degenerative changes, no acute abnormalities.  MRI lumbar showed multi-level degenerative changes, with severe spinal stenosis at L2-3 level, moderate spinal stenosis at L3-4 level with moderate foraminal stenosis.  Will review MRIs at next follow up visit.  IMPRESSION: This MRI of the thoracic spine without contrast shows the following: 1.   The spinal cord appears normal. 2.   Mild degenerative changes at T6-T7 and T7-T8 that do not lead to any nerve root compression or spinal stenosis.   IMPRESSION:  This MRI of the lumbar spine without contrast shows the following: 1.  At L2-L3, there is moderately severe spinal stenosis but only mild foraminal and lateral recess stenosis and no definite nerve root compression. 2.  At L3-L4, there is moderate spinal stenosis causing moderate foraminal narrowing and moderate left lateral recess stenosis.  There is no definite nerve root compression though there is some encroachment upon the L3 nerve root in the left L4 nerve root. 3.  At L4-L5, there are degenerative changes causing moderately severe left foraminal narrowing, moderate right foraminal narrowing and moderate left lateral recess stenosis.  There is possible left L4 nerve root compression. 4.  At L5-S1, there are degenerative changes causing moderately severe bilateral foraminal narrowing with possible bilateral L5 nerve root compression.

## 2017-12-13 NOTE — Telephone Encounter (Signed)
Please call patient, MRI thoracic showed mild degenerative changes, no acute abnormalities.  MRI lumbar showed multi-level degenerative changes, with severe spinal stenosis at L2-3 level, moderate spinal stenosis at L3-4 level with moderate foraminal stenosis.  Will review MRIs at next follow up visit.  IMPRESSION: This MRI of the thoracic spine without contrast shows the following: 1.   The spinal cord appears normal. 2.   Mild degenerative changes at T6-T7 and T7-T8 that do not lead to any nerve root compression or spinal stenosis.   IMPRESSION:  This MRI of the lumbar spine without contrast shows the following: 1.  At L2-L3, there is moderately severe spinal stenosis but only mild foraminal and lateral recess stenosis and no definite nerve root compression. 2.  At L3-L4, there is moderate spinal stenosis causing moderate foraminal narrowing and moderate left lateral recess stenosis.  There is no definite nerve root compression though there is some encroachment upon the L3 nerve root in the left L4 nerve root. 3.  At L4-L5, there are degenerative changes causing moderately severe left foraminal narrowing, moderate right foraminal narrowing and moderate left lateral recess stenosis.  There is possible left L4 nerve root compression. 4.  At L5-S1, there are degenerative changes causing moderately severe bilateral foraminal narrowing with possible bilateral L5 nerve root compression. 

## 2017-12-14 NOTE — Telephone Encounter (Signed)
I have attempted to reach the patient multiple times - no answer or machine (phone just rings repeatedly).  Hopefully, he will see the missed calls and contact us back.  Otherwise, he has an upcoming appt on 01/06/18 and his results can be reviewed at that time.

## 2017-12-15 ENCOUNTER — Ambulatory Visit: Payer: Medicare Other

## 2017-12-15 ENCOUNTER — Telehealth: Payer: Self-pay

## 2017-12-15 DIAGNOSIS — I10 Essential (primary) hypertension: Secondary | ICD-10-CM

## 2017-12-15 MED ORDER — LISINOPRIL-HYDROCHLOROTHIAZIDE 20-25 MG PO TABS: 1.0000 | ORAL_TABLET | Freq: Every day | ORAL | 3 refills | Status: DC

## 2017-12-15 NOTE — Telephone Encounter (Signed)
Christopher Robertson was here today to check his blood pressure. However, he has been off his blood pressure medications for a while. He states the pharmacy will not give him the medications. Advised patient I would call the pharmacy.    Called the pharmacy and they will have all 4 medications ready for pick up this afternoon. I tried to call patient multiple times, there was no answer.

## 2017-12-15 NOTE — Telephone Encounter (Signed)
Lawd. 

## 2017-12-15 NOTE — Telephone Encounter (Signed)
I did finally get up with his wife and she will go pick up medications.

## 2017-12-29 ENCOUNTER — Ambulatory Visit (INDEPENDENT_AMBULATORY_CARE_PROVIDER_SITE_OTHER): Payer: Medicare Other | Admitting: Sports Medicine

## 2017-12-29 VITALS — BP 124/67 | HR 86

## 2017-12-29 DIAGNOSIS — I10 Essential (primary) hypertension: Secondary | ICD-10-CM | POA: Diagnosis not present

## 2017-12-29 NOTE — Progress Notes (Signed)
Pt came into clinic today for BP check. At last OV, amlodipine  was added to his daily regime. Pt reports taking this Rx in addition to others, reports no negative side effects. Pt states he took his meds about 0800 today, but he did smoke on the way to his appointment. Pt's BP was above goal, did give him 5 min of rest in between readings and BP went into normal range. Per PCP, continue Rx treatment plan and get a home BP cuff. Pt advised to follow up with PCP in 3 months. Verbalized understanding, no further questions.

## 2018-01-06 ENCOUNTER — Ambulatory Visit (INDEPENDENT_AMBULATORY_CARE_PROVIDER_SITE_OTHER): Payer: Medicare Other | Admitting: Neurology

## 2018-01-06 DIAGNOSIS — M545 Low back pain, unspecified: Secondary | ICD-10-CM | POA: Insufficient documentation

## 2018-01-06 DIAGNOSIS — G3281 Cerebellar ataxia in diseases classified elsewhere: Secondary | ICD-10-CM

## 2018-01-06 DIAGNOSIS — R269 Unspecified abnormalities of gait and mobility: Secondary | ICD-10-CM

## 2018-01-06 MED ORDER — GABAPENTIN 300 MG PO CAPS: 300.0000 mg | ORAL_CAPSULE | Freq: Three times a day (TID) | ORAL | 11 refills | Status: DC
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
## 2018-01-06 NOTE — Procedures (Signed)
Full Name: Christopher Robertson Gender: Male MRN #: [id number] Date of Birth: July 31, 2051    Visit Date: 01/06/18 11:40 Age: 67 Years 7 Months Old Examining Physician: Levert Feinstein, MD  Referring Physician: Terrace Arabia, MD History: 67 years old male, with history of right hip replacement, complains of gait abnormality, chronic low back pain, radiating pain to bilateral lower extremity, frequent bilateral lower extremity muscle cramping.  Summary of the tests: Right sural, superficial peroneal sensory responses were absent.  Left sural and superficial peroneal sensory responses showed moderately decreased to snap amplitude. Right median sensory responses showed mildly prolonged peak latency, with mildly decreased the snap amplitude.  Right ulnar and radial sensory responses showed mildly decreased the snap amplitude.  Right median and ulnar motor responses were normal.  Left peroneal to EDB and tibial motor responses showed mildly prolonged distal latency, with normal C map amplitude, with mildly decreased conduction velocity.  Right peroneal to EDB and tibial motor responses also showed mildly decreased conduction velocity was otherwise normal.  Electromyography: Selective needle examinations of bilateral lower extremity muscles and bilateral lumbosacral paraspinal muscles were performed.  There is evidence of mild chronic neuropathic changes involving bilateral L4, 5, S1 myotomes.  There is no evidence of active process.  Conclusion:  This is an abnormal study.  There is electrodiagnostic evidence of length dependent mild axonal peripheral neuropathy; there is also evidence of mild chronic lumbosacral radiculopathy, mainly involving bilateral L4-L5, S1 myotomes.  In addition, there is evidence of right median neuropathy across the wrist consistent with mild right carpal tunnel syndromes.   ------------------------------- Levert Feinstein, M.D.  [facility] [address] [place], [place] [address] Tel: [phone] Fax: [phone]        [facility]    Nerve / Sites Muscle Latency Ref. Amplitude Ref. Rel Amp Segments Distance Velocity Ref. Area    ms ms mV mV %  cm m/s m/s mVms  R Median - APB     Wrist APB 4.4 ?4.4 7.9 ?4.0 100 Wrist - APB 7   25.7     Upper arm APB 9.5  7.2  90.6 Upper arm - Wrist 23 45 ?49 25.6  R Ulnar - ADM     Wrist ADM 2.6 ?3.3 12.4 ?6.0 100 Wrist - ADM 7   38.5     B.Elbow ADM 7.2  11.3  91.1 B.Elbow - Wrist 23 50 ?49 37.1     A.Elbow ADM 9.2  11.6  103 A.Elbow - B.Elbow 10 51 ?49 38.2         A.Elbow - Wrist      R Peroneal - EDB     Ankle EDB 6.1 ?6.5 5.6 ?2.0 100 Ankle - EDB 9   15.0     Fib head EDB 16.4  4.7  83.8 Fib head - Ankle 36 35 ?44 13.1     Pop fossa EDB 19.1  4.2  89.9 Pop fossa - Fib head 10 37 ?44 12.4         Pop fossa - Ankle      L Peroneal - EDB     Ankle EDB 6.9 ?6.5 3.5 ?2.0 100 Ankle - EDB 9   11.7     Fib head EDB 17.1  3.0  85.6 Fib head - Ankle 36 35 ?44 11.0     Pop fossa EDB 20.1  1.8  59.7 Pop fossa - Fib head 10 33 ?44 7.4  Pop fossa - Ankle      R Tibial - AH     Ankle AH 5.9 ?5.8 8.0 ?4.0 100 Ankle - AH 9   34.2     Pop fossa AH 19.1  4.8  60.8 Pop fossa - Ankle 41 31 ?41 22.6  L Tibial - AH     Ankle AH 6.5 ?5.8 4.7 ?4.0 100 Ankle - AH 9   21.7     Pop fossa AH 18.1  4.3  91.8 Pop fossa - Ankle 41 35 ?41 22.7                 SNC    Nerve / Sites Rec. Site Peak Lat Ref.  Amp Ref. Segments Distance    ms ms V V  cm  R Radial - Anatomical snuff box (Forearm)     Forearm Wrist 2.8 ?2.9 6 ?15 Forearm - Wrist 10  R Sural - Ankle (Calf)     Calf Ankle NR ?4.4 NR ?6 Calf - Ankle 14  L Sural - Ankle (Calf)     Calf Ankle 5.1 ?4.4 2 ?6 Calf - Ankle 14  L Superficial peroneal - Ankle     Lat leg Ankle 5.2 ?4.4 2 ?6 Lat leg - Ankle 14  R Superficial peroneal - Ankle     Lat leg Ankle NR ?4.4 NR ?6 Lat leg - Ankle 14  R Median - Orthodromic (Dig II, Mid palm)     Dig II Wrist 4.0  ?3.4 6 ?10 Dig II - Wrist 13  R Ulnar - Orthodromic, (Dig V, Mid palm)     Dig V Wrist 3.0 ?3.1 4 ?5 Dig V - Wrist 63                    F  Wave    Nerve F Lat Ref.   ms ms  R Tibial - AH 67.1 ?56.0  L Tibial - AH 71.7 ?56.0  R Ulnar - ADM 32.4 ?32.0           EMG full       EMG Summary Table    Spontaneous MUAP Recruitment  Muscle IA Fib PSW Fasc Other Amp Dur. Poly Pattern  R. Tibialis anterior Normal None None None _______ Normal Normal Normal Reduced  R. Tibialis posterior Normal None None None _______ Normal Normal Normal Reduced  R. Gastrocnemius (Medial head) Normal None None None _______ Normal Normal Normal Reduced  R. Vastus lateralis Normal None None None _______ Normal Normal Normal Normal  L. Tibialis anterior Normal None None None _______ Normal Normal Normal Reduced  L. Tibialis posterior Normal None None None _______ Normal Normal Normal Reduced  L. Gastrocnemius (Medial head) Normal None None None _______ Normal Normal Normal Reduced  L. Vastus lateralis Normal None None None _______ Normal Normal Normal Normal  R. Biceps femoris (long head) Normal None None None _______ Normal Normal Normal Reduced  L. Biceps femoris (long head) Normal None None None _______ Normal Normal Normal Reduced  L. Lumbar paraspinals (low) Normal None None None _______ Normal Normal Normal Normal  L. Lumbar paraspinals (mid) Increased 1+ None None _______ Increased Increased Normal Normal  R. Lumbar paraspinals (low) Normal None None None _______ Normal Normal Normal Normal  R. Lumbar paraspinals (mid) Increased 1+ None None _______ Increased Increased Normal Normal  R. First dorsal interosseous Normal None None None _______ Normal Normal Normal Normal  R. Pronator teres Normal None None None _______  Normal Normal Normal Normal  R. Biceps brachii Normal None None None _______ Normal Normal Normal Normal  R. Deltoid Normal None None None _______ Normal Normal Normal Normal  R. Extensor  digitorum communis Normal None None None _______ Normal Normal Normal Normal

## 2018-01-16 DIAGNOSIS — M48062 Spinal stenosis, lumbar region with neurogenic claudication: Secondary | ICD-10-CM | POA: Diagnosis not present

## 2018-01-16 DIAGNOSIS — F17210 Nicotine dependence, cigarettes, uncomplicated: Secondary | ICD-10-CM | POA: Diagnosis not present

## 2018-01-16 DIAGNOSIS — Z716 Tobacco abuse counseling: Secondary | ICD-10-CM | POA: Diagnosis not present

## 2018-02-10 ENCOUNTER — Emergency Department (HOSPITAL_BASED_OUTPATIENT_CLINIC_OR_DEPARTMENT_OTHER)
Admission: EM | Admit: 2018-02-10 | Discharge: 2018-02-10 | Disposition: A | Discharge status: Home / Self Care | Payer: Medicare Other | Attending: Emergency Medicine | Admitting: Emergency Medicine

## 2018-02-10 ENCOUNTER — Emergency Department (HOSPITAL_BASED_OUTPATIENT_CLINIC_OR_DEPARTMENT_OTHER): Payer: Medicare Other

## 2018-02-10 ENCOUNTER — Other Ambulatory Visit: Payer: Self-pay

## 2018-02-10 ENCOUNTER — Encounter (HOSPITAL_BASED_OUTPATIENT_CLINIC_OR_DEPARTMENT_OTHER): Payer: Self-pay | Admitting: *Deleted

## 2018-02-10 DIAGNOSIS — Z79899 Other long term (current) drug therapy: Secondary | ICD-10-CM | POA: Insufficient documentation

## 2018-02-10 DIAGNOSIS — I10 Essential (primary) hypertension: Secondary | ICD-10-CM | POA: Insufficient documentation

## 2018-02-10 DIAGNOSIS — F17210 Nicotine dependence, cigarettes, uncomplicated: Secondary | ICD-10-CM | POA: Diagnosis not present

## 2018-02-10 DIAGNOSIS — N200 Calculus of kidney: Secondary | ICD-10-CM

## 2018-02-10 DIAGNOSIS — J449 Chronic obstructive pulmonary disease, unspecified: Secondary | ICD-10-CM | POA: Diagnosis not present

## 2018-02-10 DIAGNOSIS — N132 Hydronephrosis with renal and ureteral calculous obstruction: Secondary | ICD-10-CM | POA: Diagnosis not present

## 2018-02-10 DIAGNOSIS — R109 Unspecified abdominal pain: Secondary | ICD-10-CM | POA: Diagnosis not present

## 2018-02-10 LAB — CBC WITH DIFFERENTIAL/PLATELET
Basophils Absolute: 0 10*3/uL (ref 0.0–0.1)
Basophils Relative: 0 %
Eosinophils Absolute: 0.4 10*3/uL (ref 0.0–0.7)
Eosinophils Relative: 3 %
HCT: 46.8 % (ref 39.0–52.0)
Hemoglobin: 16.3 g/dL (ref 13.0–17.0)
Lymphocytes Relative: 14 %
Lymphs Abs: 1.7 10*3/uL (ref 0.7–4.0)
MCH: 31.8 pg (ref 26.0–34.0)
MCHC: 34.8 g/dL (ref 30.0–36.0)
MCV: 91.4 fL (ref 78.0–100.0)
Monocytes Absolute: 0.8 10*3/uL (ref 0.1–1.0)
Monocytes Relative: 6 %
Neutro Abs: 9.2 10*3/uL — ABNORMAL HIGH (ref 1.7–7.7)
Neutrophils Relative %: 77 %
Platelets: 274 10*3/uL (ref 150–400)
RBC: 5.12 MIL/uL (ref 4.22–5.81)
RDW: 12.8 % (ref 11.5–15.5)
WBC: 12 10*3/uL — ABNORMAL HIGH (ref 4.0–10.5)

## 2018-02-10 LAB — BASIC METABOLIC PANEL
Anion gap: 10 (ref 5–15)
BUN: 12 mg/dL (ref 6–20)
CO2: 26 mmol/L (ref 22–32)
Calcium: 9.4 mg/dL (ref 8.9–10.3)
Chloride: 104 mmol/L (ref 101–111)
Creatinine, Ser: 1.02 mg/dL (ref 0.61–1.24)
GFR calc Af Amer: >60 mL/min (ref >60)
GFR calc non Af Amer: >60 mL/min (ref >60)
Glucose, Bld: 148 mg/dL — ABNORMAL HIGH (ref 65–99)
Potassium: 4 mmol/L (ref 3.5–5.1)
Sodium: 140 mmol/L (ref 135–145)

## 2018-02-10 LAB — URINALYSIS, ROUTINE W REFLEX MICROSCOPIC
Bilirubin Urine: NEGATIVE
Glucose, UA: NEGATIVE mg/dL
Ketones, ur: NEGATIVE mg/dL
Leukocytes, UA: NEGATIVE
Nitrite: NEGATIVE
Protein, ur: NEGATIVE mg/dL
Specific Gravity, Urine: >1.03 — ABNORMAL HIGH (ref 1.005–1.030)
pH: 5.5 (ref 5.0–8.0)

## 2018-02-10 LAB — URINALYSIS, MICROSCOPIC (REFLEX)

## 2018-02-10 MED ORDER — ONDANSETRON HCL 4 MG/2ML IJ SOLN
4.0000 mg | Freq: Once | INTRAMUSCULAR | Status: AC
  Administered 2018-02-10: 4 mg via INTRAVENOUS
  Filled 2018-02-10: qty 2

## 2018-02-10 MED ORDER — DICLOFENAC SODIUM ER 100 MG PO TB24: 100.0000 mg | ORAL_TABLET | Freq: Every day | ORAL | 0 refills | Status: DC

## 2018-02-10 MED ORDER — TAMSULOSIN HCL 0.4 MG PO CAPS: 0.4000 mg | ORAL_CAPSULE | Freq: Every day | ORAL | 0 refills | Status: DC

## 2018-02-10 MED ORDER — OXYCODONE-ACETAMINOPHEN 5-325 MG PO TABS: 1.0000 | ORAL_TABLET | Freq: Four times a day (QID) | ORAL | 0 refills | Status: DC | PRN

## 2018-02-10 MED ORDER — TAMSULOSIN HCL 0.4 MG PO CAPS
0.4000 mg | ORAL_CAPSULE | Freq: Every day | ORAL | Status: DC
  Administered 2018-02-10: 0.4 mg via ORAL
  Filled 2018-02-10: qty 1

## 2018-02-10 MED ORDER — FENTANYL CITRATE (PF) 100 MCG/2ML IJ SOLN
50.0000 ug | Freq: Once | INTRAMUSCULAR | Status: AC
  Administered 2018-02-10: 50 ug via INTRAVENOUS
  Filled 2018-02-10: qty 2

## 2018-02-10 MED ORDER — ONDANSETRON 8 MG PO TBDP: ORAL_TABLET | ORAL | 0 refills | Status: DC

## 2018-02-10 MED ORDER — KETOROLAC TROMETHAMINE 30 MG/ML IJ SOLN
15.0000 mg | Freq: Once | INTRAMUSCULAR | Status: AC
  Administered 2018-02-10: 15 mg via INTRAVENOUS
  Filled 2018-02-10: qty 1

## 2018-02-10 NOTE — ED Notes (Signed)
Patient transported to CT 

## 2018-02-10 NOTE — ED Triage Notes (Signed)
Pt reports right lower back pain, hip pain and right testicular pain. Reports hx of kidney stones.

## 2018-02-10 NOTE — ED Provider Notes (Signed)
[facility] EMERGENCY DEPARTMENT Provider Note   CSN: [id number] Arrival date & time: 02/10/18  0218     History   Chief Complaint Chief Complaint  Patient presents with  . Flank Pain    HPI Christopher Robertson is a 67 y.o. male.  The history is provided by the patient.  Flank Pain  This is a recurrent problem. The current episode started 3 to 5 hours ago. The problem occurs constantly. The problem has not changed since onset.Pertinent negatives include no chest pain, no abdominal pain, no headaches and no shortness of breath. Nothing aggravates the symptoms. Nothing relieves the symptoms. He has tried nothing for the symptoms. The treatment provided no relief.  Has not felt well all day.  Then around 11 pm patient had right flank and testicular pain.  No n/v/d.  No f/c/r.  Has chronic right hip pain.    Past Medical History:  Diagnosis Date  . COPD (chronic obstructive pulmonary disease) (HCC)   . History of concussion    yrs ago-- no residual  . Hypertension   . OA (osteoarthritis)   . Right hydrocele     Patient Active Problem List   Diagnosis Date Noted  . Low back pain without sciatica 01/06/2018  . Gait abnormality 11/30/2017  . Balance disorder 10/10/2017  . Memory loss 10/10/2017  . Vestibular dysfunction 10/04/2017  . Right lumbar radiculitis 08/02/2017  . Anxiety and depression 08/02/2017  . Insomnia 04/22/2015  . COPD, moderate (HCC) 01/16/2015  . Occult blood positive stool 01/31/2014  . 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 Hydrocele, right 08/18/2012  . Hypertension 08/18/2012  . Smoker 08/18/2012  . Preventive measure 08/18/2012    Past Surgical History:  Procedure Laterality Date  . FEMUR FRACTURE SURGERY  1985  . HYDROCELE EXCISION Right 05/23/2015   Procedure: RIGHT HYDROCELECTOMY ADULT;  Surgeon: Ihor GullyMark Ottelin, MD;  [facility];  Service: Urology;  Laterality: Right;  . KNEE SURGERY Bilateral x5  last one 2003  . TOTAL HIP ARTHROPLASTY Right 02-28-2015        Home Medications    Prior to Admission medications   Medication Sig Start Date End Date Taking? Authorizing Provider  amLODipine (NORVASC) 5 MG tablet Take 1 tablet (5 mg total) by mouth daily. 12/01/17   Monica Bectonhekkekandam, Thomas J, MD  Diclofenac Sodium CR (VOLTAREN-XR) 100 MG 24 hr tablet Take 1 tablet (100 mg total) by mouth daily. 02/10/18   Lossie Kalp, MD  donepezil (ARICEPT) 10 MG tablet Take 0.5 tablets (5 mg total) by mouth at bedtime as needed. 12/01/17   Monica Bectonhekkekandam, Thomas J, MD  DULoxetine (CYMBALTA) 60 MG capsule Take 2 capsules (120 mg total) by mouth daily. 10/04/17   Monica Bectonhekkekandam, Thomas J, MD  gabapentin (NEURONTIN) 300 MG capsule Take 1 capsule (300 mg total) by mouth 3 (three) times daily. 01/06/18   Levert FeinsteinYan, Yijun, MD  lisinopril-hydrochlorothiazide (PRINZIDE,ZESTORETIC) 20-25 MG tablet Take 1 tablet by mouth daily. 12/15/17   Monica Bectonhekkekandam, Thomas J, MD  meloxicam (MOBIC) 15 MG tablet One tab PO qAM with breakfast for 2 weeks, then daily prn pain. 08/02/17   Monica Bectonhekkekandam, Thomas J, MD  ondansetron (ZOFRAN ODT) 8 MG disintegrating tablet 8mg  ODT q8 hours prn nausea 02/10/18   Tmya Wigington, MD  oxyCODONE-acetaminophen (PERCOCET) 5-325 MG tablet Take 1 tablet by mouth every 6 (six) hours as needed for severe pain. 02/10/18   Damiya Sandefur, MD  tamsulosin (FLOMAX) 0.4 MG CAPS capsule Take 1 capsule (0.4  mg total) by mouth daily. 02/10/18   Nhyla Nappi, MD    Family History Family History  Problem Relation Age of Onset  . Dementia Mother   . Colon cancer Father   . Cancer Paternal Grandmother     Social History Social History   Tobacco Use  . Smoking status: Current Every Day Smoker    Packs/day: 0.50    Years: 40.00    Pack years: 20.00    Types: Cigarettes  . Smokeless tobacco: Former Neurosurgeon    Types: Chew  Substance Use Topics  . Alcohol use: No  . Drug use: No     Allergies   Patient has no known  allergies.   Review of Systems Review of Systems  Constitutional: Negative for fever.  Respiratory: Negative for shortness of breath.   Cardiovascular: Negative for chest pain, palpitations and leg swelling.  Gastrointestinal: Negative for abdominal pain and vomiting.  Genitourinary: Positive for flank pain and testicular pain.  Musculoskeletal: Negative for neck pain.  Neurological: Negative for headaches.  All other systems reviewed and are negative.    Physical Exam Updated Vital Signs BP (!) 180/107 (BP Location: Right Arm)   Pulse 75   Temp (!) 97.5 F (36.4 C) (Oral)   Resp 18   Ht 6\' 1"  (1.854 m)   Wt 104.3 kg (230 lb)   SpO2 100%   BMI 30.34 kg/m   Physical Exam  Constitutional: He is oriented to person, place, and time. He appears well-developed and well-nourished. No distress.  HENT:  Head: Normocephalic and atraumatic.  Mouth/Throat: No oropharyngeal exudate.  Eyes: Pupils are equal, round, and reactive to light. Conjunctivae are normal.  Neck: Normal range of motion. Neck supple.  Cardiovascular: Normal rate, regular rhythm, normal heart sounds and intact distal pulses.  Pulmonary/Chest: Effort normal and breath sounds normal. No stridor. He has no wheezes. He has no rales.  Abdominal: Soft. Bowel sounds are normal. He exhibits no mass. There is no tenderness. There is no rebound and no guarding. No hernia.  Musculoskeletal: Normal range of motion.  Neurological: He is alert and oriented to person, place, and time.  Skin: Skin is warm and dry. Capillary refill takes less than 2 seconds.  Psychiatric: He has a normal mood and affect.     ED Treatments / Results  Labs (all labs ordered are listed, but only abnormal results are displayed) Results for orders placed or performed during the hospital encounter of 02/10/18  Urinalysis, Routine w reflex microscopic  Result Value Ref Range   Color, Urine YELLOW YELLOW   APPearance CLEAR CLEAR   Specific  Gravity, Urine >1.030 (H) 1.005 - 1.030   pH 5.5 5.0 - 8.0   Glucose, UA NEGATIVE NEGATIVE mg/dL   Hgb urine dipstick LARGE (A) NEGATIVE   Bilirubin Urine NEGATIVE NEGATIVE   Ketones, ur NEGATIVE NEGATIVE mg/dL   Protein, ur NEGATIVE NEGATIVE mg/dL   Nitrite NEGATIVE NEGATIVE   Leukocytes, UA NEGATIVE NEGATIVE  CBC with Differential/Platelet  Result Value Ref Range   WBC 12.0 (H) 4.0 - 10.5 K/uL   RBC 5.12 4.22 - 5.81 MIL/uL   Hemoglobin 16.3 13.0 - 17.0 g/dL   HCT 16.1 09.6 - 04.5 %   MCV 91.4 78.0 - 100.0 fL   MCH 31.8 26.0 - 34.0 pg   MCHC 34.8 30.0 - 36.0 g/dL   RDW 40.9 81.1 - 91.4 %   Platelets 274 150 - 400 K/uL   Neutrophils Relative % 77 %  Neutro Abs 9.2 (H) 1.7 - 7.7 K/uL   Lymphocytes Relative 14 %   Lymphs Abs 1.7 0.7 - 4.0 K/uL   Monocytes Relative 6 %   Monocytes Absolute 0.8 0.1 - 1.0 K/uL   Eosinophils Relative 3 %   Eosinophils Absolute 0.4 0.0 - 0.7 K/uL   Basophils Relative 0 %   Basophils Absolute 0.0 0.0 - 0.1 K/uL  Basic metabolic panel  Result Value Ref Range   Sodium 140 135 - 145 mmol/L   Potassium 4.0 3.5 - 5.1 mmol/L   Chloride 104 101 - 111 mmol/L   CO2 26 22 - 32 mmol/L   Glucose, Bld 148 (H) 65 - 99 mg/dL   BUN 12 6 - 20 mg/dL   Creatinine, Ser 1.61 0.61 - 1.24 mg/dL   Calcium 9.4 8.9 - 09.6 mg/dL   GFR calc non Af Amer >60 >60 mL/min   GFR calc Af Amer >60 >60 mL/min   Anion gap 10 5 - 15  Urinalysis, Microscopic (reflex)  Result Value Ref Range   RBC / HPF 21-50 0 - 5 RBC/hpf   WBC, UA 0-5 0 - 5 WBC/hpf   Bacteria, UA RARE (A) NONE SEEN   Squamous Epithelial / LPF 0-5 0 - 5   Ct Renal Stone Study  Result Date: 02/10/2018 CLINICAL DATA:  Right flank pain EXAM: CT ABDOMEN AND PELVIS WITHOUT CONTRAST TECHNIQUE: Multidetector CT imaging of the abdomen and pelvis was performed following the standard protocol without IV contrast. COMPARISON:  Radiograph 08/02/2017 FINDINGS: Lower chest: Lung bases demonstrate no acute consolidation or  effusion. Coronary vascular calcification. Hepatobiliary: Subcentimeter hypodensity within the anterior liver, too small to further characterize. No calcified gallstone or biliary dilatation. Pancreas: Unremarkable. No pancreatic ductal dilatation or surrounding inflammatory changes. Spleen: Normal in size without focal abnormality. Adrenals/Urinary Tract: Adrenal glands are within normal limits. No left hydronephrosis. Punctate stone lower pole right kidney. Mild to moderate right hydronephrosis and hydroureter ureter with perinephric and peri ureteral soft tissue stranding. Distal ureter and bladder largely obscured from metallic artifact in the right hip. Possible punctate stone in the distal right ureter near the UVJ on coronal views, series 5, image number 76. Stomach/Bowel: Stomach is within normal limits. Appendix appears normal. No evidence of bowel wall thickening, distention, or inflammatory changes. Vascular/Lymphatic: Marked aortic atherosclerosis. No aneurysmal dilatation. No significant adenopathy. Reproductive: Prostate is unremarkable. Other: No free air or free fluid. Fat containing periumbilical hernia Musculoskeletal: Degenerative changes of the spine without acute or suspicious abnormality. Status post right hip replacement. IMPRESSION: 1. Moderate right hydronephrosis and hydroureter. The distal ureter in the pelvis as well as the bladder are largely obscured by artifact from metallic hardware in the right hip. Possible punctate stone in the distal right ureter on the coronal views, near the right UVJ. 2. Nonobstructing stone within the right kidney Electronically Signed   By: Jasmine Pang M.D.   On: 02/10/2018 03:15    EKG None  Radiology Ct Renal Stone Study  Result Date: 02/10/2018 CLINICAL DATA:  Right flank pain EXAM: CT ABDOMEN AND PELVIS WITHOUT CONTRAST TECHNIQUE: Multidetector CT imaging of the abdomen and pelvis was performed following the standard protocol without IV  contrast. COMPARISON:  Radiograph 08/02/2017 FINDINGS: Lower chest: Lung bases demonstrate no acute consolidation or effusion. Coronary vascular calcification. Hepatobiliary: Subcentimeter hypodensity within the anterior liver, too small to further characterize. No calcified gallstone or biliary dilatation. Pancreas: Unremarkable. No pancreatic ductal dilatation or surrounding inflammatory changes. Spleen: Normal in  size without focal abnormality. Adrenals/Urinary Tract: Adrenal glands are within normal limits. No left hydronephrosis. Punctate stone lower pole right kidney. Mild to moderate right hydronephrosis and hydroureter ureter with perinephric and peri ureteral soft tissue stranding. Distal ureter and bladder largely obscured from metallic artifact in the right hip. Possible punctate stone in the distal right ureter near the UVJ on coronal views, series 5, image number 76. Stomach/Bowel: Stomach is within normal limits. Appendix appears normal. No evidence of bowel wall thickening, distention, or inflammatory changes. Vascular/Lymphatic: Marked aortic atherosclerosis. No aneurysmal dilatation. No significant adenopathy. Reproductive: Prostate is unremarkable. Other: No free air or free fluid. Fat containing periumbilical hernia Musculoskeletal: Degenerative changes of the spine without acute or suspicious abnormality. Status post right hip replacement. IMPRESSION: 1. Moderate right hydronephrosis and hydroureter. The distal ureter in the pelvis as well as the bladder are largely obscured by artifact from metallic hardware in the right hip. Possible punctate stone in the distal right ureter on the coronal views, near the right UVJ. 2. Nonobstructing stone within the right kidney Electronically Signed   By: Jasmine Pang M.D.   On: 02/10/2018 03:15    Procedures Procedures (including critical care time)  Medications Ordered in ED Medications  tamsulosin (FLOMAX) capsule 0.4 mg (0.4 mg Oral Given 02/10/18  0326)  fentaNYL (SUBLIMAZE) injection 50 mcg (has no administration in time range)  ketorolac (TORADOL) 30 MG/ML injection 15 mg (15 mg Intravenous Given 02/10/18 0326)  ondansetron (ZOFRAN) injection 4 mg (4 mg Intravenous Given 02/10/18 0322)       Final Clinical Impressions(s) / ED Diagnoses   Final diagnoses:  Kidney stone    Return for weakness, numbness, changes in vision or speech, fevers >100.4 unrelieved by medication, shortness of breath, intractable vomiting, or diarrhea, abdominal pain, Inability to tolerate liquids or food, cough, altered mental status or any concerns. No signs of systemic illness or infection. The patient is nontoxic-appearing on exam and vital signs are within normal limits.   I have reviewed the triage vital signs and the nursing notes. Pertinent labs &imaging results that were available during my care of the patient were reviewed by me and considered in my medical decision making (see chart for details).  After history, exam, and medical workup I feel the patient has been appropriately medically screened and is safe for discharge home. Pertinent diagnoses were discussed with the patient. Patient was given return precautions.    ED Discharge Orders        Ordered    ondansetron (ZOFRAN ODT) 8 MG disintegrating tablet     02/10/18 0331    oxyCODONE-acetaminophen (PERCOCET) 5-325 MG tablet  Every 6 hours PRN     02/10/18 0331    Diclofenac Sodium CR (VOLTAREN-XR) 100 MG 24 hr tablet  Daily     02/10/18 0331    tamsulosin (FLOMAX) 0.4 MG CAPS capsule  Daily     02/10/18 0331       Marquies Wanat, MD 02/10/18 1610

## 2018-03-07 ENCOUNTER — Ambulatory Visit: Payer: Medicare Other | Admitting: Neurology

## 2018-03-08 ENCOUNTER — Encounter: Payer: Self-pay | Admitting: Neurology

## 2018-03-24 ENCOUNTER — Other Ambulatory Visit: Payer: Self-pay

## 2018-03-24 ENCOUNTER — Emergency Department (INDEPENDENT_AMBULATORY_CARE_PROVIDER_SITE_OTHER)
Admission: EM | Admit: 2018-03-24 | Discharge: 2018-03-24 | Disposition: A | Discharge status: Home / Self Care | Payer: Medicare Other | Source: Home / Self Care | Attending: Family Medicine | Admitting: Family Medicine

## 2018-03-24 ENCOUNTER — Encounter: Payer: Self-pay | Admitting: Emergency Medicine

## 2018-03-24 DIAGNOSIS — E860 Dehydration: Secondary | ICD-10-CM | POA: Diagnosis not present

## 2018-03-24 DIAGNOSIS — R0781 Pleurodynia: Secondary | ICD-10-CM | POA: Diagnosis not present

## 2018-03-24 DIAGNOSIS — W010XXA Fall on same level from slipping, tripping and stumbling without subsequent striking against object, initial encounter: Secondary | ICD-10-CM

## 2018-03-24 DIAGNOSIS — R079 Chest pain, unspecified: Secondary | ICD-10-CM | POA: Diagnosis not present

## 2018-03-24 DIAGNOSIS — W19XXXA Unspecified fall, initial encounter: Secondary | ICD-10-CM | POA: Diagnosis not present

## 2018-03-24 DIAGNOSIS — R4781 Slurred speech: Secondary | ICD-10-CM | POA: Diagnosis not present

## 2018-03-24 DIAGNOSIS — R42 Dizziness and giddiness: Secondary | ICD-10-CM | POA: Diagnosis not present

## 2018-03-24 DIAGNOSIS — R413 Other amnesia: Secondary | ICD-10-CM | POA: Diagnosis not present

## 2018-03-24 DIAGNOSIS — I10 Essential (primary) hypertension: Secondary | ICD-10-CM | POA: Diagnosis not present

## 2018-03-24 DIAGNOSIS — R109 Unspecified abdominal pain: Secondary | ICD-10-CM | POA: Diagnosis not present

## 2018-03-24 DIAGNOSIS — R55 Syncope and collapse: Secondary | ICD-10-CM | POA: Diagnosis not present

## 2018-03-24 DIAGNOSIS — R7989 Other specified abnormal findings of blood chemistry: Secondary | ICD-10-CM | POA: Diagnosis not present

## 2018-03-24 DIAGNOSIS — S29009A Unspecified injury of muscle and tendon of unspecified wall of thorax, initial encounter: Secondary | ICD-10-CM | POA: Diagnosis not present

## 2018-03-24 DIAGNOSIS — I639 Cerebral infarction, unspecified: Secondary | ICD-10-CM | POA: Diagnosis not present

## 2018-03-24 DIAGNOSIS — R52 Pain, unspecified: Secondary | ICD-10-CM | POA: Diagnosis not present

## 2018-03-24 DIAGNOSIS — R5383 Other fatigue: Secondary | ICD-10-CM

## 2018-03-24 DIAGNOSIS — R410 Disorientation, unspecified: Secondary | ICD-10-CM | POA: Diagnosis not present

## 2018-03-24 DIAGNOSIS — S0990XA Unspecified injury of head, initial encounter: Secondary | ICD-10-CM | POA: Diagnosis not present

## 2018-03-24 DIAGNOSIS — R0789 Other chest pain: Secondary | ICD-10-CM

## 2018-03-24 DIAGNOSIS — R4789 Other speech disturbances: Secondary | ICD-10-CM

## 2018-03-24 DIAGNOSIS — R748 Abnormal levels of other serum enzymes: Secondary | ICD-10-CM | POA: Diagnosis not present

## 2018-03-24 NOTE — ED Triage Notes (Signed)
Patient did take all regular medications about 3 hours ago. His color is grey and he is perspiring. Smoked cigarette less than one hour ago.

## 2018-03-24 NOTE — ED Triage Notes (Signed)
Reports worsening right lateral rib and sternum pain; fell out of shower3 days ago and hit chest against toilet; now hurting more; lucid but speech seems slow; reports dizziness, nausea, balance problems. Christopher RocherErin Robertson notified and ekg performed.

## 2018-03-24 NOTE — ED Provider Notes (Signed)
[facility] Robertson [facility]    CSN: [id number] Arrival date & time: 03/24/18  1816     History   Chief Complaint Chief Complaint  Patient presents with  . Chest Pain    HPI [name] is a 67 y.o. male.   HPI [name] is a 67 y.o. male presenting to UC accompanied by his wife with c/o sternal and Right sided chest wall pain. SOB. Fatigue and dizziness. Balance trouble, hx of same but possibly worse this week due to lack of sleep. Hx of COPD but no hx of CAD. Wife notes pt slipped and fell in the bathroom 3 days ago, hitting his Right side of chest on the toilet. He does not believe he hit his head or lost consciousness. He then had to quickly drive to [place] and back over the last 2 days. He returned yesterday but has been feeling worse throughout the day with SOB this morning. He also notes a family member was spraying spray paint in the house and believes that exacerbated his breathing.  Wife notes his speech is somewhat slurred since arriving to UC about ago.    Past Medical History:  Diagnosis Date  . COPD (chronic obstructive pulmonary disease) (HCC)   . History of concussion    yrs ago-- no residual  . Hypertension   . OA (osteoarthritis)   . Right hydrocele     Patient Active Problem List   Diagnosis Date Noted  . Low back pain without sciatica 01/06/2018  . Gait abnormality 11/30/2017  . Balance disorder 10/10/2017  . Memory loss 10/10/2017  . Vestibular dysfunction 10/04/2017  . Right lumbar radiculitis 08/02/2017  . Anxiety and depression 08/02/2017  . Insomnia 04/22/2015  . COPD, moderate (HCC) 01/16/2015  . Occult blood positive stool 01/31/2014  . 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 Hydrocele, right 08/18/2012  . Hypertension 08/18/2012  . Smoker 08/18/2012  . Preventive measure 08/18/2012    Past Surgical History:  Procedure Laterality Date  . FEMUR FRACTURE SURGERY  1985  . HYDROCELE EXCISION Right  05/23/2015   Procedure: RIGHT HYDROCELECTOMY ADULT;  Surgeon: Ihor Gully, MD;  Location: [facility];  Service: Urology;  Laterality: Right;  . KNEE SURGERY Bilateral x5 last one 2003  . TOTAL HIP ARTHROPLASTY Right 02-28-2015       Home Medications    Prior to Admission medications   Medication Sig Start Date End Date Taking? Authorizing Provider  amLODipine (NORVASC) 5 MG tablet Take 1 tablet (5 mg total) by mouth daily. 12/01/17   Monica Becton, MD  Diclofenac Sodium CR (VOLTAREN-XR) 100 MG 24 hr tablet Take 1 tablet (100 mg total) by mouth daily. 02/10/18   Palumbo, April, MD  donepezil (ARICEPT) 10 MG tablet Take 0.5 tablets (5 mg total) by mouth at bedtime as needed. 12/01/17   Monica Becton, MD  DULoxetine (CYMBALTA) 60 MG capsule Take 2 capsules (120 mg total) by mouth daily. 10/04/17   Monica Becton, MD  gabapentin (NEURONTIN) 300 MG capsule Take 1 capsule (300 mg total) by mouth 3 (three) times daily. 01/06/18   Levert Feinstein, MD  lisinopril-hydrochlorothiazide (PRINZIDE,ZESTORETIC) 20-25 MG tablet Take 1 tablet by mouth daily. 12/15/17   Monica Becton, MD  meloxicam (MOBIC) 15 MG tablet One tab PO qAM with breakfast for 2 weeks, then daily prn pain. 08/02/17   Monica Becton, MD  ondansetron (ZOFRAN ODT) 8 MG disintegrating tablet 8mg  ODT q8 hours prn nausea 02/10/18  Palumbo, April, MD  oxyCODONE-acetaminophen (PERCOCET) 5-325 MG tablet Take 1 tablet by mouth every 6 (six) hours as needed for severe pain. 02/10/18   Palumbo, April, MD  tamsulosin (FLOMAX) 0.4 MG CAPS capsule Take 1 capsule (0.4 mg total) by mouth daily. 02/10/18   Palumbo, April, MD    Family History Family History  Problem Relation Age of Onset  . Dementia Mother   . Colon cancer Father   . Cancer Paternal Grandmother     Social History Social History   Tobacco Use  . Smoking status: Current Every Day Smoker    Packs/day: 0.50    Years: 40.00    Pack  years: 20.00    Types: Cigarettes  . Smokeless tobacco: Former NeurosurgeonUser    Types: Chew  Substance Use Topics  . Alcohol use: No  . Drug use: No     Allergies   Patient has no known allergies.   Review of Systems Review of Systems  Constitutional: Positive for diaphoresis and fatigue. Negative for chills and fever.  Respiratory: Positive for shortness of breath. Negative for chest tightness and wheezing.   Cardiovascular: Positive for chest pain. Negative for palpitations and leg swelling.  Musculoskeletal: Positive for back pain and myalgias. Negative for arthralgias.  Neurological: Positive for dizziness, weakness ( generalized) and light-headedness. Negative for headaches.     Physical Exam Triage Vital Signs ED Triage Vitals  Enc Vitals Group     BP      Pulse      Resp      Temp      Temp src      SpO2      Weight      Height      Head Circumference      Peak Flow      Pain Score      Pain Loc      Pain Edu?      Excl. in GC?    No data found.  Updated Vital Signs BP (!) 145/75 (BP Location: Right Arm)   Pulse 94   Temp 97.6 F (36.4 C) (Oral)   Resp 18   SpO2 94%   Visual Acuity Right Eye Distance:   Left Eye Distance:   Bilateral Distance:    Right Eye Near:   Left Eye Near:    Bilateral Near:     Physical Exam  Constitutional: He is oriented to person, place, and time. He appears well-developed and well-nourished. He appears lethargic.  Non-toxic appearance. He appears ill. No distress.  Pt lying on exam bed. Mildly diaphoretic. Appears lethargic  HENT:  Head: Normocephalic and atraumatic.  Right Ear: Tympanic membrane normal.  Left Ear: Tympanic membrane normal.  Nose: Nose normal.  Mouth/Throat: Uvula is midline, oropharynx is clear and moist and mucous membranes are normal.  Eyes: EOM are normal.  Neck: Normal range of motion.  Cardiovascular: Normal rate and regular rhythm.  Pulmonary/Chest: Effort normal and breath sounds normal. He  exhibits tenderness. He exhibits no crepitus and no deformity.    Abdominal: Soft. There is tenderness. There is no rebound and no guarding.  Musculoskeletal: Normal range of motion.  Slowed but full ROM upper extremities and lower extremities while lying on exam bed.   Neurological: He is oriented to person, place, and time. He appears lethargic.  Alert to person place and time but slight slurred and slowed speech.   Skin: Skin is warm and dry. Capillary refill takes less than 2 seconds.  Psychiatric: He has a normal mood and affect. His behavior is normal.  Nursing note and vitals reviewed.    UC Treatments / Results  Labs (all labs ordered are listed, but only abnormal results are displayed) Labs Reviewed - No data to display  EKG Date/Time:03/24/2018     18:56:06 Ventricular Rate: 82 PR Interval: 166 QRS Duration: 94 QT Interval: 396 QTC Calculation: 462 P-R-T axes: 61   16   83 Text Interpretation: Normal sinus rhythm. Normal ECG    Radiology No results found.  Procedures Procedures (including critical care time)  Medications Ordered in UC Medications - No data to display  Initial Impression / Assessment and Plan / UC Course  I have reviewed the triage vital signs and the nursing notes.  Pertinent labs & imaging results that were available during my care of the patient were reviewed by me and considered in my medical decision making (see chart for details).    Difficulty getting accurate hx on pt due to extensive PMH.  Difficulty determined baseline vs new symptoms even with some guidance by his wife. EMS called to transport pt to [facility] for further evaluation.  Final Clinical Impressions(s) / UC Diagnoses   Final diagnoses:  Dizzy  Fatigue, unspecified type  Slow rate of speech  Nonspecific chest pain  Right-sided chest wall pain  Sternal pain  Fall from slip, trip, or stumble, initial encounter   Discharge Instructions    None    ED Prescriptions    None     Controlled Substance Prescriptions [place] Controlled Substance Registry consulted? Not Applicable   Rolla Plate 03/24/18 1941

## 2018-03-25 MED ORDER — SODIUM CHLORIDE 0.9 % IV SOLN
20.00 | INTRAVENOUS | Status: DC
Start: ? — End: 2018-03-25

## 2018-03-30 ENCOUNTER — Ambulatory Visit: Payer: Medicare Other | Admitting: Sports Medicine

## 2018-04-09 DIAGNOSIS — I10 Essential (primary) hypertension: Secondary | ICD-10-CM | POA: Diagnosis not present

## 2018-04-09 DIAGNOSIS — J449 Chronic obstructive pulmonary disease, unspecified: Secondary | ICD-10-CM | POA: Diagnosis not present

## 2018-04-09 DIAGNOSIS — R0602 Shortness of breath: Secondary | ICD-10-CM | POA: Diagnosis not present

## 2018-04-09 DIAGNOSIS — R4182 Altered mental status, unspecified: Secondary | ICD-10-CM | POA: Diagnosis not present

## 2018-04-09 DIAGNOSIS — R51 Headache: Secondary | ICD-10-CM | POA: Diagnosis not present

## 2018-04-09 DIAGNOSIS — Z79899 Other long term (current) drug therapy: Secondary | ICD-10-CM | POA: Diagnosis not present

## 2018-04-09 DIAGNOSIS — R110 Nausea: Secondary | ICD-10-CM | POA: Diagnosis not present

## 2018-04-09 DIAGNOSIS — R42 Dizziness and giddiness: Secondary | ICD-10-CM | POA: Diagnosis not present

## 2018-04-09 DIAGNOSIS — R000 Tachycardia, unspecified: Secondary | ICD-10-CM | POA: Diagnosis not present

## 2018-04-09 DIAGNOSIS — R069 Unspecified abnormalities of breathing: Secondary | ICD-10-CM | POA: Diagnosis not present

## 2018-04-09 DIAGNOSIS — F329 Major depressive disorder, single episode, unspecified: Secondary | ICD-10-CM | POA: Diagnosis not present

## 2018-04-09 DIAGNOSIS — F17200 Nicotine dependence, unspecified, uncomplicated: Secondary | ICD-10-CM | POA: Diagnosis not present

## 2018-04-09 DIAGNOSIS — M5489 Other dorsalgia: Secondary | ICD-10-CM | POA: Diagnosis not present

## 2018-05-21 DIAGNOSIS — F17290 Nicotine dependence, other tobacco product, uncomplicated: Secondary | ICD-10-CM | POA: Diagnosis not present

## 2018-05-21 DIAGNOSIS — M25552 Pain in left hip: Secondary | ICD-10-CM | POA: Diagnosis not present

## 2018-05-21 DIAGNOSIS — G8911 Acute pain due to trauma: Secondary | ICD-10-CM | POA: Diagnosis not present

## 2018-05-21 DIAGNOSIS — J449 Chronic obstructive pulmonary disease, unspecified: Secondary | ICD-10-CM | POA: Diagnosis not present

## 2018-05-21 DIAGNOSIS — M25559 Pain in unspecified hip: Secondary | ICD-10-CM | POA: Diagnosis not present

## 2018-05-21 DIAGNOSIS — Z96641 Presence of right artificial hip joint: Secondary | ICD-10-CM | POA: Diagnosis not present

## 2018-05-21 DIAGNOSIS — Z471 Aftercare following joint replacement surgery: Secondary | ICD-10-CM | POA: Diagnosis not present

## 2018-05-21 DIAGNOSIS — I10 Essential (primary) hypertension: Secondary | ICD-10-CM | POA: Diagnosis not present

## 2018-05-21 DIAGNOSIS — Z79899 Other long term (current) drug therapy: Secondary | ICD-10-CM | POA: Diagnosis not present

## 2018-11-03 ENCOUNTER — Ambulatory Visit (INDEPENDENT_AMBULATORY_CARE_PROVIDER_SITE_OTHER): Payer: Medicare Other | Admitting: Sports Medicine

## 2018-11-03 VITALS — BP 177/96 | HR 102 | Ht 73.0 in | Wt 255.0 lb

## 2018-11-03 DIAGNOSIS — J449 Chronic obstructive pulmonary disease, unspecified: Secondary | ICD-10-CM | POA: Diagnosis not present

## 2018-11-03 DIAGNOSIS — I10 Essential (primary) hypertension: Secondary | ICD-10-CM

## 2018-11-03 DIAGNOSIS — Z23 Encounter for immunization: Secondary | ICD-10-CM

## 2018-11-03 DIAGNOSIS — Z299 Encounter for prophylactic measures, unspecified: Secondary | ICD-10-CM | POA: Diagnosis not present

## 2018-11-03 MED ORDER — ALBUTEROL SULFATE HFA 108 (90 BASE) MCG/ACT IN AERS: 2.0000 | INHALATION_SPRAY | Freq: Four times a day (QID) | RESPIRATORY_TRACT | 11 refills | Status: DC | PRN

## 2018-11-03 MED ORDER — AMLODIPINE BESYLATE 10 MG PO TABS: 10.0000 mg | ORAL_TABLET | Freq: Every day | ORAL | 3 refills | Status: DC

## 2018-11-03 MED ORDER — UMECLIDINIUM-VILANTEROL 62.5-25 MCG/INH IN AEPB: 1.0000 | INHALATION_SPRAY | Freq: Every day | RESPIRATORY_TRACT | 1 refills | Status: DC

## 2018-11-03 MED ORDER — LISINOPRIL-HYDROCHLOROTHIAZIDE 20-25 MG PO TABS: 1.0000 | ORAL_TABLET | Freq: Every day | ORAL | 3 refills | Status: DC

## 2018-11-03 NOTE — Assessment & Plan Note (Signed)
Continue to advise smoking cessation, he does smoke about 11 cigarettes a day. Historically he was noncompliant with Breo. Adding an oral today. Albuterol for use before riding his bike and walking outside. Screening CT.  Patient does have greater than 30-pack-year smoking history. He does have a bit of orthopnea, so we are going to add some labs including a BNP.

## 2018-11-03 NOTE — Assessment & Plan Note (Signed)
Flu shot, pneumococcal 23. Cologuard.

## 2018-11-03 NOTE — Assessment & Plan Note (Signed)
Uncontrolled. He tells [place] that he has been taking his medication but I last filled his lisinopril/HCTZ in April 2019 with a 44-month supply. Increasing amlodipine to 10 mg. Recheck in 1 month.

## 2018-11-03 NOTE — Progress Notes (Signed)
Subjective:    CC: Follow-up multiple issues  HPI: Hypertension: Uncontrolled, he does tell us he is taking his amlodipine and lisinopril/HCTZ.  COPD: He was never really fully compliant with Breo, does get shortness of breath with mild activity.  Also having some occasional orthopnea.  Mild coughing, particularly when going up the cold and trying to ride his bike.  Has never used short acting bronchodilators before physical activity.  Symptoms are moderate, persistent.  Preventive measures: Due for flu shot, pneumococcal 23, Cologuard.  I reviewed the past medical history, family history, social history, surgical history, and allergies today and no changes were needed.  Please see the problem list section below in epic for further details.  Past Medical History: Past Medical History:  Diagnosis Date  . COPD (chronic obstructive pulmonary disease) (HCC)   . History of concussion    yrs ago-- no residual  . Hypertension   . OA (osteoarthritis)   . Right hydrocele    Past Surgical History: Past Surgical History:  Procedure Laterality Date  . FEMUR FRACTURE SURGERY  1985  . HYDROCELE EXCISION Right 05/23/2015   Procedure: RIGHT HYDROCELECTOMY ADULT;  Surgeon: Ihor Gully, MD;  Location: [facility];  Service: Urology;  Laterality: Right;  . KNEE SURGERY Bilateral x5 last one 2003  . TOTAL HIP ARTHROPLASTY Right 02-28-2015   Social History: Social History   Socioeconomic History  . Marital status: Married    Spouse name: Not on file  . Number of children: Not on file  . Years of education: Not on file  . Highest education level: Not on file  Occupational History  . Not on file  Social Needs  . Financial resource strain: Not on file  . Food insecurity:    Worry: Not on file    Inability: Not on file  . Transportation needs:    Medical: Not on file    Non-medical: Not on file  Tobacco Use  . Smoking status: Current Every Day Smoker    Packs/day: 0.50    Years: 40.00    Pack years: 20.00    Types: Cigarettes  . Smokeless tobacco: Former Neurosurgeon    Types: Chew  Substance and Sexual Activity  . Alcohol use: No  . Drug use: No  . Sexual activity: Not on file  Lifestyle  . Physical activity:    Days per week: Not on file    Minutes per session: Not on file  . Stress: Not on file  Relationships  . Social connections:    Talks on phone: Not on file    Gets together: Not on file    Attends religious service: Not on file    Active member of club or organization: Not on file    Attends meetings of clubs or organizations: Not on file    Relationship status: Not on file  Other Topics Concern  . Not on file  Social History Narrative  . Not on file   Family History: Family History  Problem Relation Age of Onset  . Dementia Mother   . Colon cancer Father   . Cancer Paternal Grandmother    Allergies: No Known Allergies Medications: See med rec.  Review of Systems: No fevers, chills, night sweats, weight loss, chest pain, or shortness of breath.   Objective:    General: Well Developed, well nourished, and in no acute distress.  Neuro: Alert and oriented x3, extra-ocular muscles intact, sensation grossly intact.  HEENT: Normocephalic, atraumatic, pupils equal round  reactive to light, neck supple, no masses, no lymphadenopathy, thyroid nonpalpable.  Skin: Warm and dry, no rashes. Cardiac: Regular rate and rhythm, no murmurs rubs or gallops, no lower extremity edema.  Respiratory: Clear to auscultation bilaterally. Not using accessory muscles, speaking in full sentences.  Impression and Recommendations:    Preventive measure Flu shot, pneumococcal 23. Cologuard.   COPD, moderate Continue to advise smoking cessation, he does smoke about 11 cigarettes a day. Historically he was noncompliant with Breo. Adding an oral today. Albuterol for use before riding his bike and walking outside. Screening CT.  Patient does have greater than  30-pack-year smoking history. He does have a bit of orthopnea, so we are going to add some labs including a BNP.  Hypertension Uncontrolled. He tells [place] that he has been taking his medication but I last filled his lisinopril/HCTZ in April 2019 with a 70-month supply. Increasing amlodipine to 10 mg. Recheck in 1 month. ___________________________________________ Ihor Austin. Benjamin Stain, M.D., ABFM., CAQSM. Primary Care and Sports Medicine [facility]  Adjunct Professor of Family Medicine  [employer]

## 2018-11-04 LAB — LIPID PANEL W/REFLEX DIRECT LDL
Cholesterol: 193 mg/dL (ref <200)
HDL: 51 mg/dL (ref >=40)
LDL Cholesterol (Calc): 112 mg/dL (calc) — ABNORMAL HIGH
Non-HDL Cholesterol (Calc): 142 mg/dL (calc) — ABNORMAL HIGH (ref <130)
Total CHOL/HDL Ratio: 3.8 (calc) (ref <5.0)
Triglycerides: 178 mg/dL — ABNORMAL HIGH (ref <150)

## 2018-11-04 LAB — COMPREHENSIVE METABOLIC PANEL
AG Ratio: 1.3 (calc) (ref 1.0–2.5)
ALT: 12 U/L (ref 9–46)
AST: 13 U/L (ref 10–35)
Albumin: 4 g/dL (ref 3.6–5.1)
Alkaline phosphatase (APISO): 74 U/L (ref 35–144)
BUN: 13 mg/dL (ref 7–25)
CO2: 27 mmol/L (ref 20–32)
Calcium: 9.7 mg/dL (ref 8.6–10.3)
Chloride: 99 mmol/L (ref 98–110)
Creat: 1.15 mg/dL (ref 0.70–1.25)
Globulin: 3.2 g/dL (calc) (ref 1.9–3.7)
Glucose, Bld: 106 mg/dL — ABNORMAL HIGH (ref 65–99)
Potassium: 4.4 mmol/L (ref 3.5–5.3)
Sodium: 137 mmol/L (ref 135–146)
Total Bilirubin: 0.7 mg/dL (ref 0.2–1.2)
Total Protein: 7.2 g/dL (ref 6.1–8.1)

## 2018-11-04 LAB — CBC
HCT: 49.3 % (ref 38.5–50.0)
Hemoglobin: 17.1 g/dL (ref 13.2–17.1)
MCH: 30.4 pg (ref 27.0–33.0)
MCHC: 34.7 g/dL (ref 32.0–36.0)
MCV: 87.7 fL (ref 80.0–100.0)
MPV: 10.1 fL (ref 7.5–12.5)
Platelets: 338 10*3/uL (ref 140–400)
RBC: 5.62 10*6/uL (ref 4.20–5.80)
RDW: 12.5 % (ref 11.0–15.0)
WBC: 8.6 10*3/uL (ref 3.8–10.8)

## 2018-11-04 LAB — HEMOGLOBIN A1C
Hgb A1c MFr Bld: 6.2 % of total Hgb — ABNORMAL HIGH (ref <5.7)
Mean Plasma Glucose: 131 (calc)
eAG (mmol/L): 7.3 (calc)

## 2018-11-04 LAB — TSH: TSH: 2.44 mIU/L (ref 0.40–4.50)

## 2018-11-04 LAB — BRAIN NATRIURETIC PEPTIDE: Brain Natriuretic Peptide: 277 pg/mL — ABNORMAL HIGH (ref <100)

## 2018-11-06 ENCOUNTER — Telehealth: Payer: Self-pay

## 2018-11-06 ENCOUNTER — Encounter: Payer: Self-pay | Admitting: Sports Medicine

## 2018-11-06 DIAGNOSIS — J449 Chronic obstructive pulmonary disease, unspecified: Secondary | ICD-10-CM

## 2018-11-06 MED ORDER — GLYCOPYRROLATE-FORMOTEROL 9-4.8 MCG/ACT IN AERO: 1.0000 | INHALATION_SPRAY | Freq: Every day | RESPIRATORY_TRACT | 0 refills | Status: DC

## 2018-11-06 NOTE — Telephone Encounter (Signed)
I have printed a prescription for Bevespi with a free 30-day trial which can be used by anyone.  He can use this for a month until he gets Medicare D and then we can switch to Anoro.  See how much of the he should see how much the albuterol costs alone without the other inhaler now.  Rx is in my box.

## 2018-11-06 NOTE — Telephone Encounter (Signed)
Left message advising of recommendations.  

## 2018-11-06 NOTE — Telephone Encounter (Signed)
See MyChart message.    Christopher Robertson,  I am sorry to say there are no coupon cards for these medications without commercial insurance. I will send the message to Dr Benjamin Stain so he is aware of the cost.    Christopher Robertson ===View-only below this line===   ----- Message -----    From: Christopher Robertson    Sent: 11/06/2018  1:58 AM EDT      To: [name], MD Subject: RE: Non-Urgent Medical Question  Are you aware of any discounts or free samples available for the the prescriptions Anoro and Albuterol ?, My Medicare part D doesn't take effect until April 1st & the cost for those 2 prescriptions is almost $550.00.

## 2018-11-09 ENCOUNTER — Encounter: Payer: Self-pay | Admitting: Sports Medicine

## 2018-11-09 DIAGNOSIS — R7989 Other specified abnormal findings of blood chemistry: Secondary | ICD-10-CM

## 2018-11-10 DIAGNOSIS — R7989 Other specified abnormal findings of blood chemistry: Principal | ICD-10-CM

## 2018-11-10 DIAGNOSIS — I517 Cardiomegaly: Secondary | ICD-10-CM | POA: Insufficient documentation

## 2018-11-20 ENCOUNTER — Telehealth: Payer: Self-pay | Admitting: Cardiology

## 2018-11-20 NOTE — Telephone Encounter (Signed)
Referring physican - Monica Becton, MD Patient's echo was rescheduled from 11/22/2018 to 01/24/2019 at 8:15 due to CoVid19   Left VM for patient with new date and time of Echo

## 2018-11-22 ENCOUNTER — Ambulatory Visit (HOSPITAL_BASED_OUTPATIENT_CLINIC_OR_DEPARTMENT_OTHER): Payer: Medicare Other

## 2018-11-22 IMAGING — DX DG LUMBAR SPINE COMPLETE 4+V
5 series · 5 of 5 positions shown · non-contrast
Comparison: 07/06/2016

CLINICAL DATA: 66-year-old male with chronic lower back pain since
hip replacement 2 and half years ago. No known injury. Initial
encounter.

EXAM:
LUMBAR SPINE - COMPLETE 4+ VIEW

[l-spine ap]
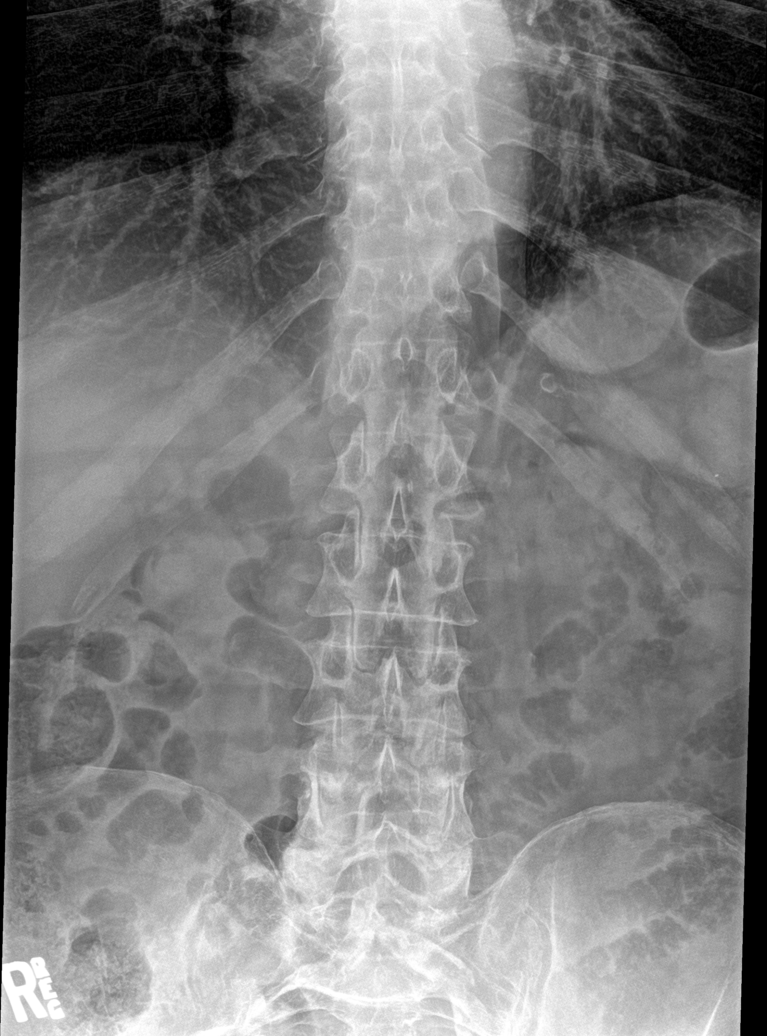

[l-spine obl (1 of 2)]
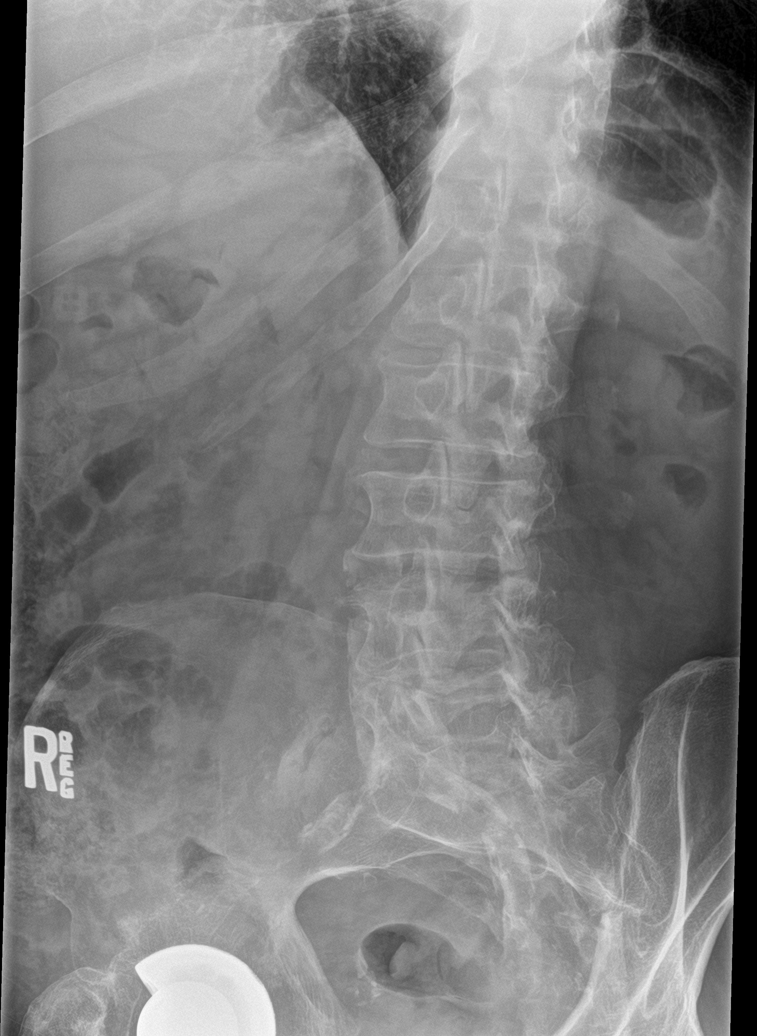

[l-spine obl (2 of 2)]
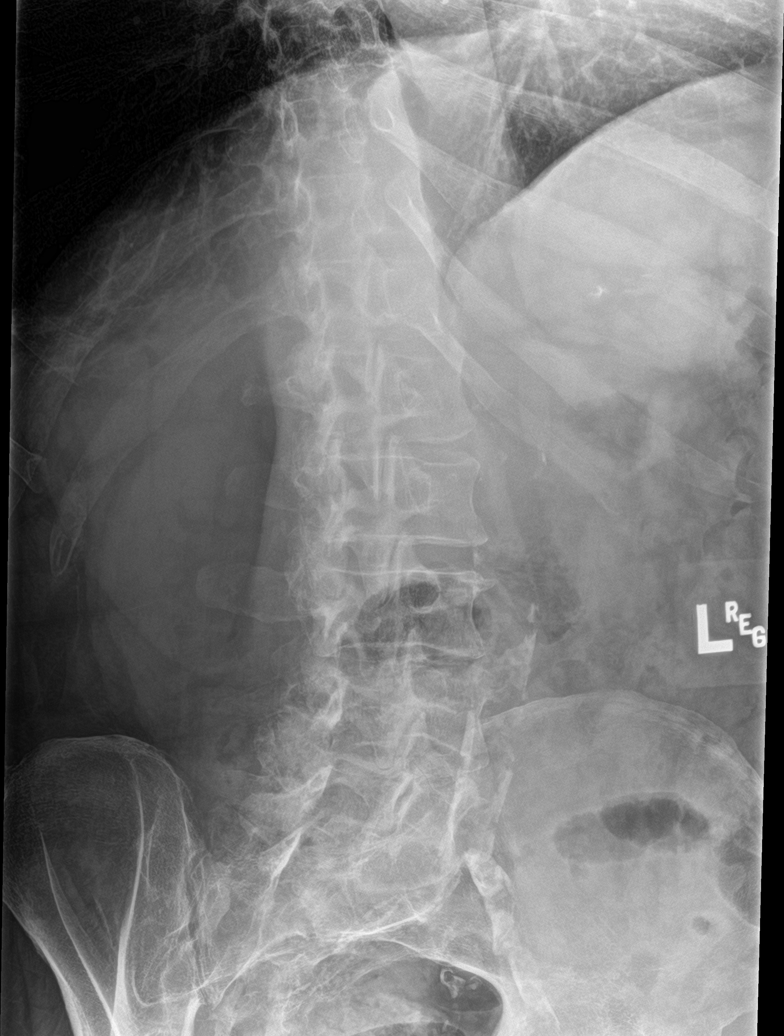

[l-spine lat]
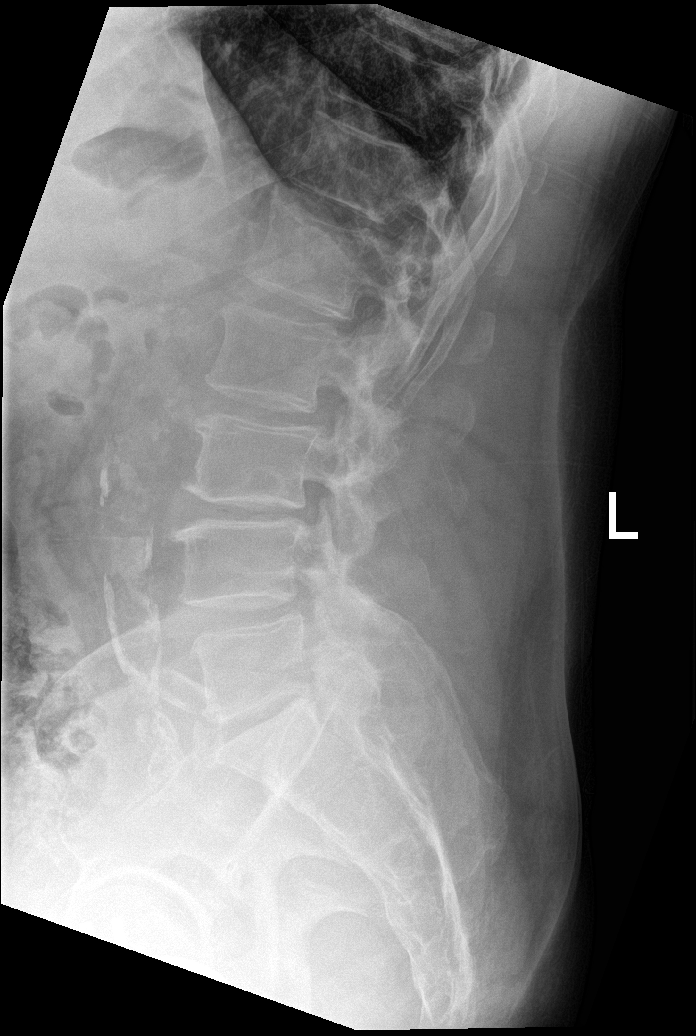

[l-spine spot]
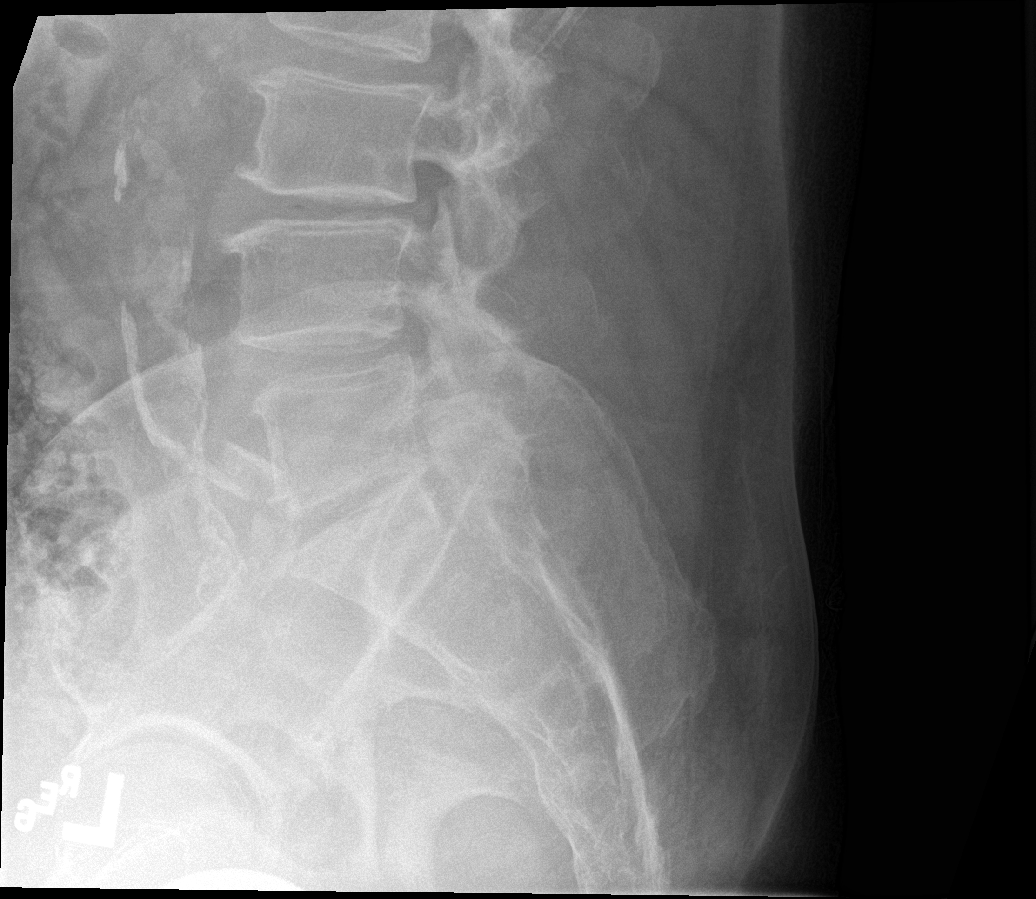

[5 of 5 positions shown; findings below may reference images not displayed]

FINDINGS: 2.5 mm anterior slip L2 secondary to facet degenerative changes with
mild L2-3 disc space narrowing.

Mild to moderate L3-4 disc space narrowing.

Minimal L4-5 disc space narrowing and mild bilateral facet
degenerative changes.

Mild to moderate L5-S1 disc space narrowing with mild facet
degenerative changes.

No focal lumbar compression fracture.

Vascular calcifications.
IMPRESSION: Slight progression of degenerative changes since prior exam.

2.5 mm anterior slip L2 secondary to facet degenerative changes with
mild L2-3 disc space narrowing.

Mild to moderate L3-4 disc space narrowing.

Minimal L4-5 disc space narrowing and mild bilateral facet
degenerative changes.

Mild to moderate L5-S1 disc space narrowing with mild facet
degenerative changes.

Aortic Atherosclerosis ([id number]).

## 2018-12-04 ENCOUNTER — Ambulatory Visit: Payer: Medicare Other | Admitting: Sports Medicine

## 2018-12-30 ENCOUNTER — Encounter: Payer: Self-pay | Admitting: Sports Medicine

## 2019-01-01 ENCOUNTER — Encounter: Payer: Self-pay | Admitting: Sports Medicine

## 2019-01-01 DIAGNOSIS — F29 Unspecified psychosis not due to a substance or known physiological condition: Secondary | ICD-10-CM

## 2019-01-02 DIAGNOSIS — F29 Unspecified psychosis not due to a substance or known physiological condition: Secondary | ICD-10-CM | POA: Insufficient documentation

## 2019-01-02 NOTE — Assessment & Plan Note (Signed)
Bizarre/magical thinking, psychotic activity, manifesting multiple different personalities. 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 I would like her evaluated by psychiatry.

## 2019-01-04 ENCOUNTER — Other Ambulatory Visit: Payer: Self-pay | Admitting: Sports Medicine

## 2019-01-04 NOTE — Telephone Encounter (Signed)
Swoops. 

## 2019-01-04 NOTE — Telephone Encounter (Signed)
Should be updated now. Thank you

## 2019-01-04 NOTE — Addendum Note (Signed)
Addended by: Monica Becton on: 01/04/2019 10:49 AM   Modules accepted: Orders

## 2019-01-24 ENCOUNTER — Other Ambulatory Visit: Payer: Self-pay

## 2019-01-24 ENCOUNTER — Ambulatory Visit (HOSPITAL_BASED_OUTPATIENT_CLINIC_OR_DEPARTMENT_OTHER)
Admission: RE | Admit: 2019-01-24 | Discharge: 2019-01-24 | Disposition: A | Discharge status: Home / Self Care | Payer: Medicare Other | Source: Ambulatory Visit | Attending: Sports Medicine | Admitting: Sports Medicine

## 2019-01-24 DIAGNOSIS — R7989 Other specified abnormal findings of blood chemistry: Secondary | ICD-10-CM | POA: Insufficient documentation

## 2019-01-24 DIAGNOSIS — I509 Heart failure, unspecified: Secondary | ICD-10-CM | POA: Insufficient documentation

## 2019-01-24 DIAGNOSIS — I110 Hypertensive heart disease with heart failure: Secondary | ICD-10-CM | POA: Diagnosis not present

## 2019-01-24 DIAGNOSIS — I5020 Unspecified systolic (congestive) heart failure: Secondary | ICD-10-CM

## 2019-01-24 NOTE — Progress Notes (Signed)
  Echocardiogram 2D Echocardiogram has been performed.  Christopher Robertson 01/24/2019, 9:01 AM

## 2019-01-29 ENCOUNTER — Encounter: Payer: Self-pay | Admitting: Sports Medicine

## 2019-01-29 ENCOUNTER — Ambulatory Visit (INDEPENDENT_AMBULATORY_CARE_PROVIDER_SITE_OTHER): Payer: Medicare Other | Admitting: Sports Medicine

## 2019-01-29 DIAGNOSIS — I517 Cardiomegaly: Secondary | ICD-10-CM

## 2019-01-29 DIAGNOSIS — M5416 Radiculopathy, lumbar region: Secondary | ICD-10-CM | POA: Diagnosis not present

## 2019-01-29 DIAGNOSIS — Z299 Encounter for prophylactic measures, unspecified: Secondary | ICD-10-CM | POA: Diagnosis not present

## 2019-01-29 DIAGNOSIS — I10 Essential (primary) hypertension: Secondary | ICD-10-CM | POA: Diagnosis not present

## 2019-01-29 MED ORDER — CYCLOBENZAPRINE HCL 10 MG PO TABS: ORAL_TABLET | ORAL | 0 refills | Status: DC

## 2019-01-29 MED ORDER — MELOXICAM 15 MG PO TABS: ORAL_TABLET | ORAL | 3 refills | Status: DC

## 2019-01-29 NOTE — Assessment & Plan Note (Signed)
Adding meloxicam, Flexeril. Rehab exercises. He will get a hold of a inversion table. Return to see me in 6 weeks, referral for epidural injection if no better.

## 2019-01-29 NOTE — Progress Notes (Signed)
Subjective:    CC: Follow-up  HPI: Hypertension: Far better controlled, feels okay, no orthostasis.  Back pain: Worse with prolonged standing, radiation down both legs with weakness, he has been seeing neurology for this.  No bowel or bladder dysfunction, saddle numbness, constitutional symptoms.  I reviewed the past medical history, family history, social history, surgical history, and allergies today and no changes were needed.  Please see the problem list section below in epic for further details.  Past Medical History: Past Medical History:  Diagnosis Date  . COPD (chronic obstructive pulmonary disease) (HCC)   . History of concussion    yrs ago-- no residual  . Hypertension   . OA (osteoarthritis)   . Right hydrocele    Past Surgical History: Past Surgical History:  Procedure Laterality Date  . FEMUR FRACTURE SURGERY  1985  . HYDROCELE EXCISION Right 05/23/2015   Procedure: RIGHT HYDROCELECTOMY ADULT;  Surgeon: Ihor GullyMark Ottelin, MD;  [facility];  Service: Urology;  Laterality: Right;  . KNEE SURGERY Bilateral x5 last one 2003  . TOTAL HIP ARTHROPLASTY Right 02-28-2015   Social History: Social History   Socioeconomic History  . Marital status: Married    Spouse name: Not on file  . Number of children: Not on file  . Years of education: Not on file  . Highest education level: Not on file  Occupational History  . Not on file  Social Needs  . Financial resource strain: Not on file  . Food insecurity:    Worry: Not on file    Inability: Not on file  . Transportation needs:    Medical: Not on file    Non-medical: Not on file  Tobacco Use  . Smoking status: Current Every Day Smoker    Packs/day: 0.50    Years: 40.00    Pack years: 20.00    Types: Cigarettes  . Smokeless tobacco: Former NeurosurgeonUser    Types: Chew  Substance and Sexual Activity  . Alcohol use: No  . Drug use: No  . Sexual activity: Not on file  Lifestyle  . Physical activity:     Days per week: Not on file    Minutes per session: Not on file  . Stress: Not on file  Relationships  . Social connections:    Talks on phone: Not on file    Gets together: Not on file    Attends religious service: Not on file    Active member of club or organization: Not on file    Attends meetings of clubs or organizations: Not on file    Relationship status: Not on file  Other Topics Concern  . Not on file  Social History Narrative  . Not on file   Family History: Family History  Problem Relation Age of Onset  . Dementia Mother   . Colon cancer Father   . Cancer Paternal Grandmother    Allergies: No Known Allergies Medications: See med rec.  Review of Systems: No fevers, chills, night sweats, weight loss, chest pain, or shortness of breath.   Objective:    General: Well Developed, well nourished, and in no acute distress.  Neuro: Alert and oriented x3, extra-ocular muscles intact, sensation grossly intact.  HEENT: Normocephalic, atraumatic, pupils equal round reactive to light, neck supple, no masses, no lymphadenopathy, thyroid nonpalpable.  Skin: Warm and dry, no rashes. Cardiac: Regular rate and rhythm, no murmurs rubs or gallops, no lower extremity edema.  Respiratory: Clear to auscultation bilaterally. Not using  accessory muscles, speaking in full sentences.  Impression and Recommendations:    Preventive measure Adding Cologuard testing.  Right lumbar radiculitis Adding meloxicam, Flexeril. Rehab exercises. He will get a hold of a inversion table. Return to see me in 6 weeks, referral for epidural injection if no better.  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  Hypertension Improved considerably, no change in medications for now.  I spent 25 minutes with this patient, greater than 50% was face-to-face time counseling regarding the above diagnoses.  ___________________________________________ Ihor Austin. Benjamin Stain, M.D., ABFM., CAQSM.  Primary Care and Sports Medicine [facility]  Adjunct Professor of Family Medicine  [employer]

## 2019-01-29 NOTE — Assessment & Plan Note (Signed)
Improved considerably, no change in medications for now.

## 2019-01-29 NOTE — Assessment & Plan Note (Signed)
Adding Cologuard testing. 

## 2019-01-29 NOTE — Assessment & Plan Note (Signed)
Likely hypertension related.

## 2019-02-11 ENCOUNTER — Encounter: Payer: Self-pay | Admitting: Emergency Medicine

## 2019-02-11 ENCOUNTER — Other Ambulatory Visit: Payer: Self-pay

## 2019-02-11 ENCOUNTER — Emergency Department (INDEPENDENT_AMBULATORY_CARE_PROVIDER_SITE_OTHER): Payer: Medicare Other

## 2019-02-11 ENCOUNTER — Emergency Department (INDEPENDENT_AMBULATORY_CARE_PROVIDER_SITE_OTHER)
Admission: EM | Admit: 2019-02-11 | Discharge: 2019-02-11 | Disposition: A | Discharge status: Home / Self Care | Payer: Medicare Other | Source: Home / Self Care

## 2019-02-11 DIAGNOSIS — M25462 Effusion, left knee: Secondary | ICD-10-CM

## 2019-02-11 DIAGNOSIS — R0781 Pleurodynia: Secondary | ICD-10-CM

## 2019-02-11 DIAGNOSIS — M25562 Pain in left knee: Secondary | ICD-10-CM

## 2019-02-11 DIAGNOSIS — S299XXA Unspecified injury of thorax, initial encounter: Secondary | ICD-10-CM | POA: Diagnosis not present

## 2019-02-11 DIAGNOSIS — M25512 Pain in left shoulder: Secondary | ICD-10-CM | POA: Diagnosis not present

## 2019-02-11 DIAGNOSIS — S8992XA Unspecified injury of left lower leg, initial encounter: Secondary | ICD-10-CM | POA: Diagnosis not present

## 2019-02-11 DIAGNOSIS — S4992XA Unspecified injury of left shoulder and upper arm, initial encounter: Secondary | ICD-10-CM | POA: Diagnosis not present

## 2019-02-11 NOTE — Discharge Instructions (Signed)
°  Your x-rays did not show any broken bones or dislocated bones.    You may alternate cool and warm compresses as needed for pain.  It is recommended you continue to take over the counter ibuprofen and any previously prescribed medications for pain from Dr. Dianah Field.  If you do wind up taking Meloxicam, do not take that WITH ibuprofen as they are similar medications and can cause stomach ulcers or GI bleeds.  Please follow up with Dr. Dianah Field later this week as needed.
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
## 2019-02-12 ENCOUNTER — Ambulatory Visit (INDEPENDENT_AMBULATORY_CARE_PROVIDER_SITE_OTHER): Payer: Medicare Other | Admitting: Sports Medicine

## 2019-02-12 ENCOUNTER — Encounter: Payer: Self-pay | Admitting: Sports Medicine

## 2019-02-12 DIAGNOSIS — W19XXXA Unspecified fall, initial encounter: Secondary | ICD-10-CM | POA: Insufficient documentation

## 2019-02-12 DIAGNOSIS — S4992XA Unspecified injury of left shoulder and upper arm, initial encounter: Secondary | ICD-10-CM | POA: Diagnosis not present

## 2019-02-12 DIAGNOSIS — L84 Corns and callosities: Secondary | ICD-10-CM | POA: Diagnosis not present

## 2019-02-12 DIAGNOSIS — S8700XA Crushing injury of unspecified knee, initial encounter: Secondary | ICD-10-CM

## 2019-02-12 MED ORDER — TIZANIDINE HCL 4 MG PO TABS: 4.0000 mg | ORAL_TABLET | Freq: Every day | ORAL | 2 refills | Status: AC
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
## 2019-02-12 NOTE — Assessment & Plan Note (Signed)
#  2 calluses on the right first MTP based on the right heel were trimmed today.

## 2019-02-26 ENCOUNTER — Ambulatory Visit (INDEPENDENT_AMBULATORY_CARE_PROVIDER_SITE_OTHER): Payer: Medicare Other | Admitting: Sports Medicine

## 2019-02-26 ENCOUNTER — Encounter: Payer: Self-pay | Admitting: Sports Medicine

## 2019-02-26 DIAGNOSIS — L84 Corns and callosities: Secondary | ICD-10-CM | POA: Diagnosis not present

## 2019-02-26 DIAGNOSIS — M5416 Radiculopathy, lumbar region: Secondary | ICD-10-CM | POA: Diagnosis not present

## 2019-02-26 DIAGNOSIS — J449 Chronic obstructive pulmonary disease, unspecified: Secondary | ICD-10-CM

## 2019-02-26 MED ORDER — BEVESPI AEROSPHERE 9-4.8 MCG/ACT IN AERO: 1.0000 | INHALATION_SPRAY | Freq: Every day | RESPIRATORY_TRACT | 0 refills | Status: DC
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
## 2019-02-26 NOTE — Assessment & Plan Note (Signed)
Unable to afford Anoro, switching to [place] with a 30-day trial offer. We may need to try Stiolto in the future or simply doing separate prescriptions for a LABA and a LAMA.

## 2019-02-26 NOTE — Assessment & Plan Note (Signed)
Multilevel spinal stenosis, worst at L2-L3, also severe at L3-L4. Proceeding with right L2-L3 and right L3-L4 transforaminal epidurals with Dr. Francesco Runner.

## 2019-02-26 NOTE — Assessment & Plan Note (Signed)
Resolved with trimming of the calluses.

## 2019-03-13 ENCOUNTER — Ambulatory Visit: Payer: Medicare Other | Admitting: Sports Medicine

## 2019-04-11 DIAGNOSIS — G894 Chronic pain syndrome: Secondary | ICD-10-CM | POA: Diagnosis not present

## 2019-04-11 DIAGNOSIS — R748 Abnormal levels of other serum enzymes: Secondary | ICD-10-CM | POA: Diagnosis not present

## 2019-04-11 DIAGNOSIS — I25110 Atherosclerotic heart disease of native coronary artery with unstable angina pectoris: Secondary | ICD-10-CM | POA: Diagnosis not present

## 2019-04-11 DIAGNOSIS — F339 Major depressive disorder, recurrent, unspecified: Secondary | ICD-10-CM | POA: Diagnosis not present

## 2019-04-11 DIAGNOSIS — I5022 Chronic systolic (congestive) heart failure: Secondary | ICD-10-CM | POA: Diagnosis not present

## 2019-04-11 DIAGNOSIS — G8929 Other chronic pain: Secondary | ICD-10-CM | POA: Diagnosis not present

## 2019-04-11 DIAGNOSIS — R918 Other nonspecific abnormal finding of lung field: Secondary | ICD-10-CM | POA: Diagnosis not present

## 2019-04-11 DIAGNOSIS — I471 Supraventricular tachycardia: Secondary | ICD-10-CM | POA: Diagnosis not present

## 2019-04-11 DIAGNOSIS — I160 Hypertensive urgency: Secondary | ICD-10-CM | POA: Diagnosis not present

## 2019-04-11 DIAGNOSIS — J441 Chronic obstructive pulmonary disease with (acute) exacerbation: Secondary | ICD-10-CM | POA: Diagnosis not present

## 2019-04-11 DIAGNOSIS — R069 Unspecified abnormalities of breathing: Secondary | ICD-10-CM | POA: Diagnosis not present

## 2019-04-11 DIAGNOSIS — R0689 Other abnormalities of breathing: Secondary | ICD-10-CM | POA: Diagnosis not present

## 2019-04-11 DIAGNOSIS — J9601 Acute respiratory failure with hypoxia: Secondary | ICD-10-CM | POA: Diagnosis not present

## 2019-04-11 DIAGNOSIS — R0600 Dyspnea, unspecified: Secondary | ICD-10-CM | POA: Diagnosis not present

## 2019-04-11 DIAGNOSIS — I420 Dilated cardiomyopathy: Secondary | ICD-10-CM | POA: Diagnosis not present

## 2019-04-11 DIAGNOSIS — F17210 Nicotine dependence, cigarettes, uncomplicated: Secondary | ICD-10-CM | POA: Diagnosis not present

## 2019-04-11 DIAGNOSIS — R9431 Abnormal electrocardiogram [ECG] [EKG]: Secondary | ICD-10-CM | POA: Diagnosis not present

## 2019-04-11 DIAGNOSIS — R0602 Shortness of breath: Secondary | ICD-10-CM | POA: Diagnosis not present

## 2019-04-11 DIAGNOSIS — J9621 Acute and chronic respiratory failure with hypoxia: Secondary | ICD-10-CM | POA: Diagnosis not present

## 2019-04-11 DIAGNOSIS — Z20828 Contact with and (suspected) exposure to other viral communicable diseases: Secondary | ICD-10-CM | POA: Diagnosis not present

## 2019-04-11 DIAGNOSIS — R000 Tachycardia, unspecified: Secondary | ICD-10-CM | POA: Diagnosis not present

## 2019-04-11 DIAGNOSIS — R0902 Hypoxemia: Secondary | ICD-10-CM | POA: Diagnosis not present

## 2019-04-11 DIAGNOSIS — J449 Chronic obstructive pulmonary disease, unspecified: Secondary | ICD-10-CM | POA: Diagnosis not present

## 2019-04-11 DIAGNOSIS — I10 Essential (primary) hypertension: Secondary | ICD-10-CM | POA: Diagnosis not present

## 2019-04-11 DIAGNOSIS — Z720 Tobacco use: Secondary | ICD-10-CM | POA: Diagnosis not present

## 2019-04-11 DIAGNOSIS — R7989 Other specified abnormal findings of blood chemistry: Secondary | ICD-10-CM | POA: Diagnosis not present

## 2019-04-11 DIAGNOSIS — R079 Chest pain, unspecified: Secondary | ICD-10-CM | POA: Diagnosis not present

## 2019-04-12 DIAGNOSIS — I5022 Chronic systolic (congestive) heart failure: Secondary | ICD-10-CM | POA: Diagnosis not present

## 2019-04-12 DIAGNOSIS — J9601 Acute respiratory failure with hypoxia: Secondary | ICD-10-CM | POA: Diagnosis not present

## 2019-04-12 DIAGNOSIS — F419 Anxiety disorder, unspecified: Secondary | ICD-10-CM | POA: Diagnosis not present

## 2019-04-12 DIAGNOSIS — R9431 Abnormal electrocardiogram [ECG] [EKG]: Secondary | ICD-10-CM | POA: Diagnosis not present

## 2019-04-12 DIAGNOSIS — R748 Abnormal levels of other serum enzymes: Secondary | ICD-10-CM | POA: Diagnosis not present

## 2019-04-12 DIAGNOSIS — I059 Rheumatic mitral valve disease, unspecified: Secondary | ICD-10-CM | POA: Diagnosis not present

## 2019-04-12 DIAGNOSIS — R0602 Shortness of breath: Secondary | ICD-10-CM | POA: Diagnosis not present

## 2019-04-12 DIAGNOSIS — I517 Cardiomegaly: Secondary | ICD-10-CM | POA: Diagnosis not present

## 2019-04-12 DIAGNOSIS — I519 Heart disease, unspecified: Secondary | ICD-10-CM | POA: Diagnosis not present

## 2019-04-12 DIAGNOSIS — G8929 Other chronic pain: Secondary | ICD-10-CM | POA: Diagnosis not present

## 2019-04-12 DIAGNOSIS — F17210 Nicotine dependence, cigarettes, uncomplicated: Secondary | ICD-10-CM | POA: Diagnosis not present

## 2019-04-12 DIAGNOSIS — I420 Dilated cardiomyopathy: Secondary | ICD-10-CM | POA: Diagnosis not present

## 2019-04-12 DIAGNOSIS — I5189 Other ill-defined heart diseases: Secondary | ICD-10-CM | POA: Diagnosis not present

## 2019-04-12 DIAGNOSIS — Z720 Tobacco use: Secondary | ICD-10-CM | POA: Diagnosis not present

## 2019-04-12 DIAGNOSIS — I110 Hypertensive heart disease with heart failure: Secondary | ICD-10-CM | POA: Diagnosis not present

## 2019-04-12 DIAGNOSIS — Z79899 Other long term (current) drug therapy: Secondary | ICD-10-CM | POA: Diagnosis not present

## 2019-04-12 DIAGNOSIS — I10 Essential (primary) hypertension: Secondary | ICD-10-CM | POA: Diagnosis not present

## 2019-04-12 DIAGNOSIS — J441 Chronic obstructive pulmonary disease with (acute) exacerbation: Secondary | ICD-10-CM | POA: Diagnosis not present

## 2019-04-12 DIAGNOSIS — F339 Major depressive disorder, recurrent, unspecified: Secondary | ICD-10-CM | POA: Diagnosis not present

## 2019-04-12 DIAGNOSIS — I160 Hypertensive urgency: Secondary | ICD-10-CM | POA: Diagnosis not present

## 2019-04-12 DIAGNOSIS — G894 Chronic pain syndrome: Secondary | ICD-10-CM | POA: Diagnosis not present

## 2019-04-12 DIAGNOSIS — R42 Dizziness and giddiness: Secondary | ICD-10-CM | POA: Diagnosis not present

## 2019-04-13 DIAGNOSIS — Z79899 Other long term (current) drug therapy: Secondary | ICD-10-CM | POA: Diagnosis not present

## 2019-04-13 DIAGNOSIS — F17210 Nicotine dependence, cigarettes, uncomplicated: Secondary | ICD-10-CM | POA: Diagnosis not present

## 2019-04-13 DIAGNOSIS — F419 Anxiety disorder, unspecified: Secondary | ICD-10-CM | POA: Diagnosis not present

## 2019-04-13 DIAGNOSIS — J939 Pneumothorax, unspecified: Secondary | ICD-10-CM | POA: Diagnosis not present

## 2019-04-13 DIAGNOSIS — R42 Dizziness and giddiness: Secondary | ICD-10-CM | POA: Diagnosis not present

## 2019-04-13 DIAGNOSIS — R9431 Abnormal electrocardiogram [ECG] [EKG]: Secondary | ICD-10-CM | POA: Diagnosis not present

## 2019-04-13 DIAGNOSIS — J9601 Acute respiratory failure with hypoxia: Secondary | ICD-10-CM | POA: Diagnosis not present

## 2019-04-13 DIAGNOSIS — I6523 Occlusion and stenosis of bilateral carotid arteries: Secondary | ICD-10-CM | POA: Diagnosis not present

## 2019-04-13 DIAGNOSIS — I255 Ischemic cardiomyopathy: Secondary | ICD-10-CM | POA: Diagnosis not present

## 2019-04-13 DIAGNOSIS — I25110 Atherosclerotic heart disease of native coronary artery with unstable angina pectoris: Secondary | ICD-10-CM | POA: Diagnosis present

## 2019-04-13 DIAGNOSIS — R079 Chest pain, unspecified: Secondary | ICD-10-CM | POA: Diagnosis not present

## 2019-04-13 DIAGNOSIS — I081 Rheumatic disorders of both mitral and tricuspid valves: Secondary | ICD-10-CM | POA: Diagnosis not present

## 2019-04-13 DIAGNOSIS — Z01810 Encounter for preprocedural cardiovascular examination: Secondary | ICD-10-CM | POA: Diagnosis not present

## 2019-04-13 DIAGNOSIS — Z720 Tobacco use: Secondary | ICD-10-CM | POA: Diagnosis not present

## 2019-04-13 DIAGNOSIS — J441 Chronic obstructive pulmonary disease with (acute) exacerbation: Secondary | ICD-10-CM | POA: Diagnosis not present

## 2019-04-13 DIAGNOSIS — I110 Hypertensive heart disease with heart failure: Secondary | ICD-10-CM | POA: Diagnosis not present

## 2019-04-13 DIAGNOSIS — Z833 Family history of diabetes mellitus: Secondary | ICD-10-CM | POA: Diagnosis not present

## 2019-04-13 DIAGNOSIS — I119 Hypertensive heart disease without heart failure: Secondary | ICD-10-CM | POA: Diagnosis present

## 2019-04-13 DIAGNOSIS — I5022 Chronic systolic (congestive) heart failure: Secondary | ICD-10-CM | POA: Diagnosis not present

## 2019-04-13 DIAGNOSIS — I2510 Atherosclerotic heart disease of native coronary artery without angina pectoris: Secondary | ICD-10-CM | POA: Diagnosis not present

## 2019-04-13 DIAGNOSIS — R748 Abnormal levels of other serum enzymes: Secondary | ICD-10-CM | POA: Diagnosis not present

## 2019-04-13 DIAGNOSIS — R7301 Impaired fasting glucose: Secondary | ICD-10-CM | POA: Diagnosis not present

## 2019-04-13 DIAGNOSIS — I214 Non-ST elevation (NSTEMI) myocardial infarction: Secondary | ICD-10-CM | POA: Diagnosis not present

## 2019-04-13 DIAGNOSIS — F339 Major depressive disorder, recurrent, unspecified: Secondary | ICD-10-CM | POA: Diagnosis not present

## 2019-04-13 DIAGNOSIS — R0902 Hypoxemia: Secondary | ICD-10-CM | POA: Diagnosis not present

## 2019-04-13 DIAGNOSIS — G8929 Other chronic pain: Secondary | ICD-10-CM | POA: Diagnosis not present

## 2019-04-13 DIAGNOSIS — I420 Dilated cardiomyopathy: Secondary | ICD-10-CM | POA: Diagnosis not present

## 2019-04-13 DIAGNOSIS — D72829 Elevated white blood cell count, unspecified: Secondary | ICD-10-CM | POA: Diagnosis present

## 2019-04-13 DIAGNOSIS — I517 Cardiomegaly: Secondary | ICD-10-CM | POA: Diagnosis not present

## 2019-04-13 DIAGNOSIS — J9621 Acute and chronic respiratory failure with hypoxia: Secondary | ICD-10-CM | POA: Diagnosis present

## 2019-04-13 DIAGNOSIS — I471 Supraventricular tachycardia: Secondary | ICD-10-CM | POA: Diagnosis not present

## 2019-04-13 DIAGNOSIS — E6609 Other obesity due to excess calories: Secondary | ICD-10-CM | POA: Diagnosis not present

## 2019-04-13 DIAGNOSIS — R7989 Other specified abnormal findings of blood chemistry: Secondary | ICD-10-CM | POA: Diagnosis not present

## 2019-04-13 DIAGNOSIS — Z6836 Body mass index (BMI) 36.0-36.9, adult: Secondary | ICD-10-CM | POA: Diagnosis not present

## 2019-04-13 DIAGNOSIS — G894 Chronic pain syndrome: Secondary | ICD-10-CM | POA: Diagnosis not present

## 2019-04-13 DIAGNOSIS — J449 Chronic obstructive pulmonary disease, unspecified: Secondary | ICD-10-CM | POA: Diagnosis present

## 2019-04-13 DIAGNOSIS — I10 Essential (primary) hypertension: Secondary | ICD-10-CM | POA: Diagnosis not present

## 2019-04-13 DIAGNOSIS — I8393 Asymptomatic varicose veins of bilateral lower extremities: Secondary | ICD-10-CM | POA: Diagnosis not present

## 2019-04-13 DIAGNOSIS — R0602 Shortness of breath: Secondary | ICD-10-CM | POA: Diagnosis not present

## 2019-04-13 DIAGNOSIS — R000 Tachycardia, unspecified: Secondary | ICD-10-CM | POA: Diagnosis not present

## 2019-04-13 DIAGNOSIS — Z951 Presence of aortocoronary bypass graft: Secondary | ICD-10-CM | POA: Diagnosis not present

## 2019-04-13 DIAGNOSIS — R739 Hyperglycemia, unspecified: Secondary | ICD-10-CM | POA: Diagnosis not present

## 2019-04-13 DIAGNOSIS — J9811 Atelectasis: Secondary | ICD-10-CM | POA: Diagnosis not present

## 2019-04-13 DIAGNOSIS — J90 Pleural effusion, not elsewhere classified: Secondary | ICD-10-CM | POA: Diagnosis not present

## 2019-04-13 DIAGNOSIS — J811 Chronic pulmonary edema: Secondary | ICD-10-CM | POA: Diagnosis not present

## 2019-04-13 DIAGNOSIS — Z01818 Encounter for other preprocedural examination: Secondary | ICD-10-CM | POA: Diagnosis not present

## 2019-04-13 DIAGNOSIS — I160 Hypertensive urgency: Secondary | ICD-10-CM | POA: Diagnosis not present

## 2019-04-15 DIAGNOSIS — R0602 Shortness of breath: Secondary | ICD-10-CM | POA: Diagnosis not present

## 2019-04-16 DIAGNOSIS — R079 Chest pain, unspecified: Secondary | ICD-10-CM | POA: Diagnosis not present

## 2019-04-16 DIAGNOSIS — I8393 Asymptomatic varicose veins of bilateral lower extremities: Secondary | ICD-10-CM | POA: Diagnosis not present

## 2019-04-16 DIAGNOSIS — I6523 Occlusion and stenosis of bilateral carotid arteries: Secondary | ICD-10-CM | POA: Diagnosis not present

## 2019-04-16 DIAGNOSIS — Z01810 Encounter for preprocedural cardiovascular examination: Secondary | ICD-10-CM | POA: Diagnosis not present

## 2019-04-17 DIAGNOSIS — I081 Rheumatic disorders of both mitral and tricuspid valves: Secondary | ICD-10-CM | POA: Diagnosis not present

## 2019-04-17 DIAGNOSIS — I2510 Atherosclerotic heart disease of native coronary artery without angina pectoris: Secondary | ICD-10-CM | POA: Diagnosis not present

## 2019-04-17 DIAGNOSIS — Z951 Presence of aortocoronary bypass graft: Secondary | ICD-10-CM | POA: Diagnosis not present

## 2019-04-17 DIAGNOSIS — I214 Non-ST elevation (NSTEMI) myocardial infarction: Secondary | ICD-10-CM | POA: Diagnosis not present

## 2019-04-17 DIAGNOSIS — I517 Cardiomegaly: Secondary | ICD-10-CM | POA: Diagnosis not present

## 2019-04-18 DIAGNOSIS — J811 Chronic pulmonary edema: Secondary | ICD-10-CM | POA: Diagnosis not present

## 2019-04-18 DIAGNOSIS — Z951 Presence of aortocoronary bypass graft: Secondary | ICD-10-CM | POA: Diagnosis not present

## 2019-04-18 DIAGNOSIS — R7301 Impaired fasting glucose: Secondary | ICD-10-CM | POA: Diagnosis not present

## 2019-04-18 DIAGNOSIS — R739 Hyperglycemia, unspecified: Secondary | ICD-10-CM | POA: Diagnosis not present

## 2019-04-18 DIAGNOSIS — Z6836 Body mass index (BMI) 36.0-36.9, adult: Secondary | ICD-10-CM | POA: Diagnosis not present

## 2019-04-18 DIAGNOSIS — E6609 Other obesity due to excess calories: Secondary | ICD-10-CM | POA: Diagnosis not present

## 2019-04-18 DIAGNOSIS — J939 Pneumothorax, unspecified: Secondary | ICD-10-CM | POA: Diagnosis not present

## 2019-04-19 DIAGNOSIS — J9811 Atelectasis: Secondary | ICD-10-CM | POA: Diagnosis not present

## 2019-04-19 DIAGNOSIS — Z951 Presence of aortocoronary bypass graft: Secondary | ICD-10-CM | POA: Diagnosis not present

## 2019-04-19 DIAGNOSIS — R739 Hyperglycemia, unspecified: Secondary | ICD-10-CM | POA: Diagnosis not present

## 2019-04-19 DIAGNOSIS — R7301 Impaired fasting glucose: Secondary | ICD-10-CM | POA: Diagnosis not present

## 2019-04-19 DIAGNOSIS — J90 Pleural effusion, not elsewhere classified: Secondary | ICD-10-CM | POA: Diagnosis not present

## 2019-04-19 DIAGNOSIS — E6609 Other obesity due to excess calories: Secondary | ICD-10-CM | POA: Diagnosis not present

## 2019-04-19 DIAGNOSIS — Z6836 Body mass index (BMI) 36.0-36.9, adult: Secondary | ICD-10-CM | POA: Diagnosis not present

## 2019-04-20 DIAGNOSIS — Z6836 Body mass index (BMI) 36.0-36.9, adult: Secondary | ICD-10-CM | POA: Diagnosis not present

## 2019-04-20 DIAGNOSIS — Z951 Presence of aortocoronary bypass graft: Secondary | ICD-10-CM | POA: Diagnosis not present

## 2019-04-20 DIAGNOSIS — E6609 Other obesity due to excess calories: Secondary | ICD-10-CM | POA: Diagnosis not present

## 2019-04-20 DIAGNOSIS — R739 Hyperglycemia, unspecified: Secondary | ICD-10-CM | POA: Diagnosis not present

## 2019-04-20 DIAGNOSIS — R7301 Impaired fasting glucose: Secondary | ICD-10-CM | POA: Diagnosis not present

## 2019-04-21 DIAGNOSIS — E6609 Other obesity due to excess calories: Secondary | ICD-10-CM | POA: Diagnosis not present

## 2019-04-21 DIAGNOSIS — Z951 Presence of aortocoronary bypass graft: Secondary | ICD-10-CM | POA: Diagnosis not present

## 2019-04-21 DIAGNOSIS — R739 Hyperglycemia, unspecified: Secondary | ICD-10-CM | POA: Diagnosis not present

## 2019-04-21 DIAGNOSIS — Z6836 Body mass index (BMI) 36.0-36.9, adult: Secondary | ICD-10-CM | POA: Diagnosis not present

## 2019-04-21 DIAGNOSIS — R7301 Impaired fasting glucose: Secondary | ICD-10-CM | POA: Diagnosis not present

## 2019-04-28 DIAGNOSIS — T8132XA Disruption of internal operation (surgical) wound, not elsewhere classified, initial encounter: Secondary | ICD-10-CM | POA: Diagnosis not present

## 2019-04-28 DIAGNOSIS — Z7982 Long term (current) use of aspirin: Secondary | ICD-10-CM | POA: Diagnosis not present

## 2019-04-28 DIAGNOSIS — I10 Essential (primary) hypertension: Secondary | ICD-10-CM | POA: Diagnosis not present

## 2019-04-28 DIAGNOSIS — T8130XA Disruption of wound, unspecified, initial encounter: Secondary | ICD-10-CM | POA: Diagnosis not present

## 2019-04-28 DIAGNOSIS — J449 Chronic obstructive pulmonary disease, unspecified: Secondary | ICD-10-CM | POA: Diagnosis not present

## 2019-04-28 DIAGNOSIS — T8131XA Disruption of external operation (surgical) wound, not elsewhere classified, initial encounter: Secondary | ICD-10-CM | POA: Diagnosis not present

## 2019-04-28 DIAGNOSIS — F329 Major depressive disorder, single episode, unspecified: Secondary | ICD-10-CM | POA: Diagnosis not present

## 2019-04-28 DIAGNOSIS — Z79899 Other long term (current) drug therapy: Secondary | ICD-10-CM | POA: Diagnosis not present

## 2019-05-05 DIAGNOSIS — J449 Chronic obstructive pulmonary disease, unspecified: Secondary | ICD-10-CM | POA: Diagnosis not present

## 2019-05-08 DIAGNOSIS — J9811 Atelectasis: Secondary | ICD-10-CM | POA: Diagnosis not present

## 2019-05-08 DIAGNOSIS — Z48812 Encounter for surgical aftercare following surgery on the circulatory system: Secondary | ICD-10-CM | POA: Diagnosis not present

## 2019-05-08 DIAGNOSIS — A403 Sepsis due to Streptococcus pneumoniae: Secondary | ICD-10-CM | POA: Diagnosis not present

## 2019-05-08 DIAGNOSIS — Z6835 Body mass index (BMI) 35.0-35.9, adult: Secondary | ICD-10-CM | POA: Diagnosis not present

## 2019-05-08 DIAGNOSIS — S21101A Unspecified open wound of right front wall of thorax without penetration into thoracic cavity, initial encounter: Secondary | ICD-10-CM | POA: Diagnosis not present

## 2019-05-08 DIAGNOSIS — Z20828 Contact with and (suspected) exposure to other viral communicable diseases: Secondary | ICD-10-CM | POA: Diagnosis not present

## 2019-05-08 DIAGNOSIS — T8143XA Infection following a procedure, organ and space surgical site, initial encounter: Secondary | ICD-10-CM | POA: Diagnosis not present

## 2019-05-08 DIAGNOSIS — I2510 Atherosclerotic heart disease of native coronary artery without angina pectoris: Secondary | ICD-10-CM | POA: Diagnosis not present

## 2019-05-08 DIAGNOSIS — T8130XA Disruption of wound, unspecified, initial encounter: Secondary | ICD-10-CM | POA: Diagnosis not present

## 2019-05-08 DIAGNOSIS — A491 Streptococcal infection, unspecified site: Secondary | ICD-10-CM | POA: Diagnosis not present

## 2019-05-08 DIAGNOSIS — Z7982 Long term (current) use of aspirin: Secondary | ICD-10-CM | POA: Diagnosis not present

## 2019-05-08 DIAGNOSIS — Z87891 Personal history of nicotine dependence: Secondary | ICD-10-CM | POA: Diagnosis not present

## 2019-05-08 DIAGNOSIS — E7800 Pure hypercholesterolemia, unspecified: Secondary | ICD-10-CM | POA: Diagnosis not present

## 2019-05-08 DIAGNOSIS — Z951 Presence of aortocoronary bypass graft: Secondary | ICD-10-CM | POA: Diagnosis not present

## 2019-05-08 DIAGNOSIS — I313 Pericardial effusion (noninflammatory): Secondary | ICD-10-CM | POA: Diagnosis not present

## 2019-05-08 DIAGNOSIS — F329 Major depressive disorder, single episode, unspecified: Secondary | ICD-10-CM | POA: Diagnosis present

## 2019-05-08 DIAGNOSIS — Z9889 Other specified postprocedural states: Secondary | ICD-10-CM | POA: Diagnosis not present

## 2019-05-08 DIAGNOSIS — I517 Cardiomegaly: Secondary | ICD-10-CM | POA: Diagnosis not present

## 2019-05-08 DIAGNOSIS — J90 Pleural effusion, not elsewhere classified: Secondary | ICD-10-CM | POA: Diagnosis not present

## 2019-05-08 DIAGNOSIS — R0989 Other specified symptoms and signs involving the circulatory and respiratory systems: Secondary | ICD-10-CM | POA: Diagnosis not present

## 2019-05-08 DIAGNOSIS — L089 Local infection of the skin and subcutaneous tissue, unspecified: Secondary | ICD-10-CM | POA: Diagnosis not present

## 2019-05-08 DIAGNOSIS — T8142XA Infection following a procedure, deep incisional surgical site, initial encounter: Secondary | ICD-10-CM | POA: Diagnosis not present

## 2019-05-08 DIAGNOSIS — J449 Chronic obstructive pulmonary disease, unspecified: Secondary | ICD-10-CM | POA: Diagnosis not present

## 2019-05-08 DIAGNOSIS — T8132XA Disruption of internal operation (surgical) wound, not elsewhere classified, initial encounter: Secondary | ICD-10-CM | POA: Diagnosis not present

## 2019-05-08 DIAGNOSIS — T8144XA Sepsis following a procedure, initial encounter: Secondary | ICD-10-CM | POA: Diagnosis not present

## 2019-05-08 DIAGNOSIS — Z79899 Other long term (current) drug therapy: Secondary | ICD-10-CM | POA: Diagnosis not present

## 2019-05-08 DIAGNOSIS — E669 Obesity, unspecified: Secondary | ICD-10-CM | POA: Diagnosis not present

## 2019-05-08 DIAGNOSIS — T8149XA Infection following a procedure, other surgical site, initial encounter: Secondary | ICD-10-CM | POA: Diagnosis not present

## 2019-05-08 DIAGNOSIS — R0602 Shortness of breath: Secondary | ICD-10-CM | POA: Diagnosis not present

## 2019-05-08 DIAGNOSIS — I10 Essential (primary) hypertension: Secondary | ICD-10-CM | POA: Diagnosis not present

## 2019-05-09 DIAGNOSIS — J449 Chronic obstructive pulmonary disease, unspecified: Secondary | ICD-10-CM | POA: Diagnosis not present

## 2019-05-09 DIAGNOSIS — Z48812 Encounter for surgical aftercare following surgery on the circulatory system: Secondary | ICD-10-CM | POA: Diagnosis not present

## 2019-05-09 DIAGNOSIS — T8132XA Disruption of internal operation (surgical) wound, not elsewhere classified, initial encounter: Secondary | ICD-10-CM | POA: Diagnosis not present

## 2019-05-09 DIAGNOSIS — R0989 Other specified symptoms and signs involving the circulatory and respiratory systems: Secondary | ICD-10-CM | POA: Diagnosis not present

## 2019-05-10 DIAGNOSIS — R0602 Shortness of breath: Secondary | ICD-10-CM | POA: Diagnosis not present

## 2019-05-10 DIAGNOSIS — T8149XA Infection following a procedure, other surgical site, initial encounter: Secondary | ICD-10-CM | POA: Diagnosis not present

## 2019-05-11 DIAGNOSIS — T8132XA Disruption of internal operation (surgical) wound, not elsewhere classified, initial encounter: Secondary | ICD-10-CM | POA: Diagnosis not present

## 2019-05-11 DIAGNOSIS — Z951 Presence of aortocoronary bypass graft: Secondary | ICD-10-CM | POA: Diagnosis not present

## 2019-05-11 DIAGNOSIS — R0602 Shortness of breath: Secondary | ICD-10-CM | POA: Diagnosis not present

## 2019-05-11 DIAGNOSIS — L089 Local infection of the skin and subcutaneous tissue, unspecified: Secondary | ICD-10-CM | POA: Diagnosis not present

## 2019-05-11 DIAGNOSIS — Z6835 Body mass index (BMI) 35.0-35.9, adult: Secondary | ICD-10-CM | POA: Diagnosis not present

## 2019-05-11 DIAGNOSIS — A403 Sepsis due to Streptococcus pneumoniae: Secondary | ICD-10-CM | POA: Diagnosis not present

## 2019-05-11 DIAGNOSIS — S21101A Unspecified open wound of right front wall of thorax without penetration into thoracic cavity, initial encounter: Secondary | ICD-10-CM | POA: Diagnosis not present

## 2019-05-12 DIAGNOSIS — T8132XA Disruption of internal operation (surgical) wound, not elsewhere classified, initial encounter: Secondary | ICD-10-CM | POA: Diagnosis not present

## 2019-05-12 DIAGNOSIS — L089 Local infection of the skin and subcutaneous tissue, unspecified: Secondary | ICD-10-CM | POA: Diagnosis not present

## 2019-05-12 DIAGNOSIS — Z951 Presence of aortocoronary bypass graft: Secondary | ICD-10-CM | POA: Diagnosis not present

## 2019-05-12 DIAGNOSIS — A403 Sepsis due to Streptococcus pneumoniae: Secondary | ICD-10-CM | POA: Diagnosis not present

## 2019-05-12 DIAGNOSIS — I517 Cardiomegaly: Secondary | ICD-10-CM | POA: Diagnosis not present

## 2019-05-12 DIAGNOSIS — Z6835 Body mass index (BMI) 35.0-35.9, adult: Secondary | ICD-10-CM | POA: Diagnosis not present

## 2019-05-12 DIAGNOSIS — S21101A Unspecified open wound of right front wall of thorax without penetration into thoracic cavity, initial encounter: Secondary | ICD-10-CM | POA: Diagnosis not present

## 2019-05-12 DIAGNOSIS — J90 Pleural effusion, not elsewhere classified: Secondary | ICD-10-CM | POA: Diagnosis not present

## 2019-05-13 DIAGNOSIS — I517 Cardiomegaly: Secondary | ICD-10-CM | POA: Diagnosis not present

## 2019-05-13 DIAGNOSIS — Z951 Presence of aortocoronary bypass graft: Secondary | ICD-10-CM | POA: Diagnosis not present

## 2019-05-14 DIAGNOSIS — Z951 Presence of aortocoronary bypass graft: Secondary | ICD-10-CM | POA: Diagnosis not present

## 2019-05-14 DIAGNOSIS — J9811 Atelectasis: Secondary | ICD-10-CM | POA: Diagnosis not present

## 2019-05-14 DIAGNOSIS — I517 Cardiomegaly: Secondary | ICD-10-CM | POA: Diagnosis not present

## 2019-05-25 DIAGNOSIS — B9689 Other specified bacterial agents as the cause of diseases classified elsewhere: Secondary | ICD-10-CM | POA: Diagnosis not present

## 2019-05-25 DIAGNOSIS — E7800 Pure hypercholesterolemia, unspecified: Secondary | ICD-10-CM | POA: Diagnosis not present

## 2019-05-25 DIAGNOSIS — I10 Essential (primary) hypertension: Secondary | ICD-10-CM | POA: Diagnosis not present

## 2019-05-25 DIAGNOSIS — Z6833 Body mass index (BMI) 33.0-33.9, adult: Secondary | ICD-10-CM | POA: Diagnosis not present

## 2019-05-25 DIAGNOSIS — Z79891 Long term (current) use of opiate analgesic: Secondary | ICD-10-CM | POA: Diagnosis not present

## 2019-05-25 DIAGNOSIS — T8142XA Infection following a procedure, deep incisional surgical site, initial encounter: Secondary | ICD-10-CM | POA: Diagnosis not present

## 2019-05-25 DIAGNOSIS — Z452 Encounter for adjustment and management of vascular access device: Secondary | ICD-10-CM | POA: Diagnosis not present

## 2019-05-25 DIAGNOSIS — B953 Streptococcus pneumoniae as the cause of diseases classified elsewhere: Secondary | ICD-10-CM | POA: Diagnosis not present

## 2019-05-25 DIAGNOSIS — T8132XA Disruption of internal operation (surgical) wound, not elsewhere classified, initial encounter: Secondary | ICD-10-CM | POA: Diagnosis not present

## 2019-05-25 DIAGNOSIS — E669 Obesity, unspecified: Secondary | ICD-10-CM | POA: Diagnosis not present

## 2019-05-25 DIAGNOSIS — J449 Chronic obstructive pulmonary disease, unspecified: Secondary | ICD-10-CM | POA: Diagnosis not present

## 2019-05-25 DIAGNOSIS — Z7982 Long term (current) use of aspirin: Secondary | ICD-10-CM | POA: Diagnosis not present

## 2019-05-25 DIAGNOSIS — Z792 Long term (current) use of antibiotics: Secondary | ICD-10-CM | POA: Diagnosis not present

## 2019-05-25 DIAGNOSIS — Z9181 History of falling: Secondary | ICD-10-CM | POA: Diagnosis not present

## 2019-05-25 DIAGNOSIS — F329 Major depressive disorder, single episode, unspecified: Secondary | ICD-10-CM | POA: Diagnosis not present

## 2019-05-25 DIAGNOSIS — Z87891 Personal history of nicotine dependence: Secondary | ICD-10-CM | POA: Diagnosis not present

## 2019-05-25 DIAGNOSIS — R7881 Bacteremia: Secondary | ICD-10-CM | POA: Diagnosis not present

## 2019-05-25 DIAGNOSIS — Z951 Presence of aortocoronary bypass graft: Secondary | ICD-10-CM | POA: Diagnosis not present

## 2019-05-25 DIAGNOSIS — R42 Dizziness and giddiness: Secondary | ICD-10-CM | POA: Diagnosis not present

## 2019-05-25 DIAGNOSIS — I2510 Atherosclerotic heart disease of native coronary artery without angina pectoris: Secondary | ICD-10-CM | POA: Diagnosis not present

## 2019-05-28 DIAGNOSIS — T8132XA Disruption of internal operation (surgical) wound, not elsewhere classified, initial encounter: Secondary | ICD-10-CM | POA: Diagnosis not present

## 2019-05-28 DIAGNOSIS — R7881 Bacteremia: Secondary | ICD-10-CM | POA: Diagnosis not present

## 2019-05-29 DIAGNOSIS — J431 Panlobular emphysema: Secondary | ICD-10-CM | POA: Diagnosis not present

## 2019-05-29 DIAGNOSIS — I2510 Atherosclerotic heart disease of native coronary artery without angina pectoris: Secondary | ICD-10-CM | POA: Diagnosis not present

## 2019-05-29 DIAGNOSIS — Z951 Presence of aortocoronary bypass graft: Secondary | ICD-10-CM | POA: Diagnosis not present

## 2019-05-29 DIAGNOSIS — T8132XA Disruption of internal operation (surgical) wound, not elsewhere classified, initial encounter: Secondary | ICD-10-CM | POA: Diagnosis not present

## 2019-05-29 DIAGNOSIS — I10 Essential (primary) hypertension: Secondary | ICD-10-CM | POA: Diagnosis not present

## 2019-05-29 DIAGNOSIS — E7849 Other hyperlipidemia: Secondary | ICD-10-CM | POA: Diagnosis not present

## 2019-05-29 DIAGNOSIS — I420 Dilated cardiomyopathy: Secondary | ICD-10-CM | POA: Diagnosis not present

## 2019-06-04 DIAGNOSIS — T8142XA Infection following a procedure, deep incisional surgical site, initial encounter: Secondary | ICD-10-CM | POA: Diagnosis not present

## 2019-06-04 DIAGNOSIS — R7881 Bacteremia: Secondary | ICD-10-CM | POA: Diagnosis not present

## 2019-06-04 DIAGNOSIS — B953 Streptococcus pneumoniae as the cause of diseases classified elsewhere: Secondary | ICD-10-CM | POA: Diagnosis not present

## 2019-06-05 DIAGNOSIS — Z472 Encounter for removal of internal fixation device: Secondary | ICD-10-CM | POA: Diagnosis not present

## 2019-06-05 DIAGNOSIS — Z951 Presence of aortocoronary bypass graft: Secondary | ICD-10-CM | POA: Diagnosis not present

## 2019-06-05 DIAGNOSIS — T8132XD Disruption of internal operation (surgical) wound, not elsewhere classified, subsequent encounter: Secondary | ICD-10-CM | POA: Diagnosis not present

## 2019-06-07 DIAGNOSIS — Z951 Presence of aortocoronary bypass graft: Secondary | ICD-10-CM | POA: Diagnosis not present

## 2019-06-11 DIAGNOSIS — R112 Nausea with vomiting, unspecified: Secondary | ICD-10-CM | POA: Diagnosis not present

## 2019-06-14 ENCOUNTER — Telehealth: Payer: Self-pay

## 2019-06-14 DIAGNOSIS — R7989 Other specified abnormal findings of blood chemistry: Secondary | ICD-10-CM | POA: Diagnosis not present

## 2019-06-14 DIAGNOSIS — R112 Nausea with vomiting, unspecified: Secondary | ICD-10-CM | POA: Diagnosis not present

## 2019-06-14 DIAGNOSIS — R748 Abnormal levels of other serum enzymes: Secondary | ICD-10-CM | POA: Diagnosis not present

## 2019-06-14 DIAGNOSIS — I10 Essential (primary) hypertension: Secondary | ICD-10-CM | POA: Diagnosis not present

## 2019-06-14 DIAGNOSIS — J449 Chronic obstructive pulmonary disease, unspecified: Secondary | ICD-10-CM | POA: Diagnosis not present

## 2019-06-14 DIAGNOSIS — Z87891 Personal history of nicotine dependence: Secondary | ICD-10-CM | POA: Diagnosis not present

## 2019-06-14 DIAGNOSIS — Z951 Presence of aortocoronary bypass graft: Secondary | ICD-10-CM | POA: Diagnosis not present

## 2019-06-14 DIAGNOSIS — Z7982 Long term (current) use of aspirin: Secondary | ICD-10-CM | POA: Diagnosis not present

## 2019-06-14 DIAGNOSIS — F329 Major depressive disorder, single episode, unspecified: Secondary | ICD-10-CM | POA: Diagnosis not present

## 2019-06-14 DIAGNOSIS — Z79899 Other long term (current) drug therapy: Secondary | ICD-10-CM | POA: Diagnosis not present

## 2019-06-14 DIAGNOSIS — R000 Tachycardia, unspecified: Secondary | ICD-10-CM | POA: Diagnosis not present

## 2019-06-14 DIAGNOSIS — R079 Chest pain, unspecified: Secondary | ICD-10-CM | POA: Diagnosis not present

## 2019-06-14 NOTE — Telephone Encounter (Signed)
Courtney with [facility] called and states patient has confusion, nausea, vomiting, cough and 10 lbs weight loss of the last few days. Advised to have the family take him to the ED to be evaluated.

## 2019-06-18 DIAGNOSIS — R112 Nausea with vomiting, unspecified: Secondary | ICD-10-CM | POA: Diagnosis not present

## 2019-06-19 ENCOUNTER — Encounter: Payer: Self-pay | Admitting: Sports Medicine

## 2019-06-19 ENCOUNTER — Ambulatory Visit (INDEPENDENT_AMBULATORY_CARE_PROVIDER_SITE_OTHER): Payer: Medicare Other

## 2019-06-19 ENCOUNTER — Other Ambulatory Visit: Payer: Self-pay

## 2019-06-19 ENCOUNTER — Ambulatory Visit (INDEPENDENT_AMBULATORY_CARE_PROVIDER_SITE_OTHER): Payer: Medicare Other | Admitting: Sports Medicine

## 2019-06-19 VITALS — BP 132/81 | HR 89 | Ht 73.0 in | Wt 257.0 lb

## 2019-06-19 DIAGNOSIS — R0602 Shortness of breath: Secondary | ICD-10-CM | POA: Diagnosis not present

## 2019-06-19 DIAGNOSIS — J449 Chronic obstructive pulmonary disease, unspecified: Secondary | ICD-10-CM

## 2019-06-19 DIAGNOSIS — F329 Major depressive disorder, single episode, unspecified: Secondary | ICD-10-CM

## 2019-06-19 DIAGNOSIS — Z9889 Other specified postprocedural states: Secondary | ICD-10-CM | POA: Diagnosis not present

## 2019-06-19 DIAGNOSIS — F419 Anxiety disorder, unspecified: Secondary | ICD-10-CM

## 2019-06-19 DIAGNOSIS — Z23 Encounter for immunization: Secondary | ICD-10-CM

## 2019-06-19 DIAGNOSIS — T8132XD Disruption of internal operation (surgical) wound, not elsewhere classified, subsequent encounter: Secondary | ICD-10-CM | POA: Diagnosis not present

## 2019-06-19 DIAGNOSIS — F32A Depression, unspecified: Secondary | ICD-10-CM

## 2019-06-19 DIAGNOSIS — Z951 Presence of aortocoronary bypass graft: Secondary | ICD-10-CM | POA: Diagnosis not present

## 2019-06-19 MED ORDER — PAROXETINE HCL 10 MG PO TABS: 10.0000 mg | ORAL_TABLET | Freq: Every day | ORAL | 3 refills | Status: DC

## 2019-06-19 MED ORDER — MOXIFLOXACIN HCL 400 MG PO TABS: 400.0000 mg | ORAL_TABLET | Freq: Every day | ORAL | 0 refills | Status: AC

## 2019-06-19 MED ORDER — MOXIFLOXACIN HCL 400 MG PO TABS: 400.0000 mg | ORAL_TABLET | Freq: Every day | ORAL | 0 refills | Status: DC

## 2019-06-19 MED ORDER — PREDNISONE 50 MG PO TABS: 50.0000 mg | ORAL_TABLET | Freq: Every day | ORAL | 0 refills | Status: DC

## 2019-06-19 NOTE — Assessment & Plan Note (Addendum)
Three-vessel CABG with sternal dehiscence, infection, currently on ceftriaxone via PICC line. Further management per infectious diseases and CT surgery. At his follow-up we will discuss labs and further medical management and optimization of his CAD.

## 2019-06-19 NOTE — Assessment & Plan Note (Signed)
Continue Bevespi, he is currently in a mild exacerbation and he may have a left lower lobe pneumonia. Chest x-ray, prednisone, moxifloxacin, if he does have a pneumonia as healthcare associated.

## 2019-06-19 NOTE — Progress Notes (Signed)
Subjective:    CC: Multiple issues  HPI: Christopher Robertson returns, he had some shortness of breath, ultimately noted to have coronary artery disease, ended up with a three-vessel CABG.  He then suffered a wound dehiscence, debridement, and closure.  He is currently with a PICC line, IV antibiotics.  Unfortunately he has developed severe anxiety and depressive symptoms, no suicidal or homicidal ideation.  In addition he has worsening shortness of breath, cough, no fevers, chills.  I reviewed the past medical history, family history, social history, surgical history, and allergies today and no changes were needed.  Please see the problem list section below in epic for further details.  Past Medical History: Past Medical History:  Diagnosis Date  . COPD (chronic obstructive pulmonary disease) ([place])   . History of concussion    yrs ago-- no residual  . Hypertension   . OA (osteoarthritis)   . Right hydrocele    Past Surgical History: Past Surgical History:  Procedure Laterality Date  . [place]  . HYDROCELE EXCISION Right 05/23/2015   Procedure: RIGHT HYDROCELECTOMY ADULT;  Surgeon: Kathie Rhodes, MD;  Location: [facility];  Service: Urology;  Laterality: Right;  . KNEE SURGERY Bilateral x5 last one 2003  . TOTAL HIP ARTHROPLASTY Right 02-28-2015   Social History: Social History   Socioeconomic History  . Marital status: Married    Spouse name: Not on file  . Number of children: Not on file  . Years of education: Not on file  . Highest education level: Not on file  Occupational History  . Not on file  Social Needs  . Financial resource strain: Not on file  . Food insecurity    Worry: Not on file    Inability: Not on file  . Transportation needs    Medical: Not on file    Non-medical: Not on file  Tobacco Use  . Smoking status: Current Every Day Smoker    Packs/day: 0.50    Years: 40.00    Pack years: 20.00    Types: Cigarettes  . Smokeless  tobacco: Former Systems developer    Types: Chew  Substance and Sexual Activity  . Alcohol use: No  . Drug use: No  . Sexual activity: Not on file  Lifestyle  . Physical activity    Days per week: Not on file    Minutes per session: Not on file  . Stress: Not on file  Relationships  . Social Herbalist on phone: Not on file    Gets together: Not on file    Attends religious service: Not on file    Active member of club or organization: Not on file    Attends meetings of clubs or organizations: Not on file    Relationship status: Not on file  Other Topics Concern  . Not on file  Social History Narrative  . Not on file   Family History: Family History  Problem Relation Age of Onset  . Dementia Mother   . Colon cancer Father   . Cancer Paternal Grandmother    Allergies: No Known Allergies Medications: See med rec.  Review of Systems: No fevers, chills, night sweats, weight loss, chest pain, or shortness of breath.   Objective:    General: Well Developed, well nourished, and in no acute distress.  Neuro: Alert and oriented x3, extra-ocular muscles intact, sensation grossly intact.  HEENT: Normocephalic, atraumatic, pupils equal round reactive to light, neck supple, no masses, no  lymphadenopathy, thyroid nonpalpable.  Skin: Warm and dry, no rashes. Cardiac: Regular rate and rhythm, no murmurs rubs or gallops, no lower extremity edema.  Respiratory: Coarse lung sounds in the left lower lobe.  Not using accessory muscles, speaking in full sentences.  Impression and Recommendations:    COPD, moderate Continue Bevespi, he is currently in a mild exacerbation and he may have a left lower lobe pneumonia. Chest x-ray, prednisone, moxifloxacin, if he does have a pneumonia as healthcare associated.  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 Adding paroxetine, behavioral therapy. Return monthly for PHQ/GAD.  Hx of CABG Three-vessel CABG with sternal  dehiscence, infection, currently on ceftriaxone via PICC line. Further management per infectious diseases and CT surgery. At his follow-up we will discuss labs and further medical management and optimization of his CAD.   ___________________________________________ Ihor Austin. Benjamin Stain, M.D., ABFM., CAQSM. Primary Care and Sports Medicine [facility]  Adjunct Professor of Family Medicine  [employer]

## 2019-06-19 NOTE — Assessment & Plan Note (Signed)
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 Adding paroxetine, behavioral therapy. Return monthly for PHQ/GAD.

## 2019-06-20 ENCOUNTER — Other Ambulatory Visit: Payer: Self-pay | Admitting: *Deleted

## 2019-06-20 DIAGNOSIS — J449 Chronic obstructive pulmonary disease, unspecified: Secondary | ICD-10-CM

## 2019-06-20 DIAGNOSIS — F329 Major depressive disorder, single episode, unspecified: Secondary | ICD-10-CM

## 2019-06-20 DIAGNOSIS — F419 Anxiety disorder, unspecified: Secondary | ICD-10-CM

## 2019-06-20 DIAGNOSIS — F32A Depression, unspecified: Secondary | ICD-10-CM

## 2019-06-20 MED ORDER — PAROXETINE HCL 10 MG PO TABS: 10.0000 mg | ORAL_TABLET | Freq: Every day | ORAL | 3 refills | Status: DC

## 2019-06-20 MED ORDER — PREDNISONE 50 MG PO TABS: 50.0000 mg | ORAL_TABLET | Freq: Every day | ORAL | 0 refills | Status: DC

## 2019-06-22 DIAGNOSIS — I10 Essential (primary) hypertension: Secondary | ICD-10-CM | POA: Diagnosis not present

## 2019-06-22 DIAGNOSIS — M96843 Postprocedural seroma of a musculoskeletal structure following other procedure: Secondary | ICD-10-CM | POA: Diagnosis not present

## 2019-06-22 DIAGNOSIS — Z951 Presence of aortocoronary bypass graft: Secondary | ICD-10-CM | POA: Diagnosis not present

## 2019-06-22 DIAGNOSIS — Z79899 Other long term (current) drug therapy: Secondary | ICD-10-CM | POA: Diagnosis not present

## 2019-06-22 DIAGNOSIS — S2020XA Contusion of thorax, unspecified, initial encounter: Secondary | ICD-10-CM | POA: Diagnosis not present

## 2019-06-22 DIAGNOSIS — J449 Chronic obstructive pulmonary disease, unspecified: Secondary | ICD-10-CM | POA: Diagnosis not present

## 2019-06-24 DIAGNOSIS — T8132XA Disruption of internal operation (surgical) wound, not elsewhere classified, initial encounter: Secondary | ICD-10-CM | POA: Diagnosis not present

## 2019-06-24 DIAGNOSIS — E669 Obesity, unspecified: Secondary | ICD-10-CM | POA: Diagnosis not present

## 2019-06-24 DIAGNOSIS — R42 Dizziness and giddiness: Secondary | ICD-10-CM | POA: Diagnosis not present

## 2019-06-24 DIAGNOSIS — Z6833 Body mass index (BMI) 33.0-33.9, adult: Secondary | ICD-10-CM | POA: Diagnosis not present

## 2019-06-24 DIAGNOSIS — J449 Chronic obstructive pulmonary disease, unspecified: Secondary | ICD-10-CM | POA: Diagnosis not present

## 2019-06-24 DIAGNOSIS — Z7982 Long term (current) use of aspirin: Secondary | ICD-10-CM | POA: Diagnosis not present

## 2019-06-24 DIAGNOSIS — Z9181 History of falling: Secondary | ICD-10-CM | POA: Diagnosis not present

## 2019-06-24 DIAGNOSIS — I10 Essential (primary) hypertension: Secondary | ICD-10-CM | POA: Diagnosis not present

## 2019-06-24 DIAGNOSIS — F329 Major depressive disorder, single episode, unspecified: Secondary | ICD-10-CM | POA: Diagnosis not present

## 2019-06-24 DIAGNOSIS — T8142XA Infection following a procedure, deep incisional surgical site, initial encounter: Secondary | ICD-10-CM | POA: Diagnosis not present

## 2019-06-24 DIAGNOSIS — I2510 Atherosclerotic heart disease of native coronary artery without angina pectoris: Secondary | ICD-10-CM | POA: Diagnosis not present

## 2019-06-24 DIAGNOSIS — Z951 Presence of aortocoronary bypass graft: Secondary | ICD-10-CM | POA: Diagnosis not present

## 2019-06-24 DIAGNOSIS — B9689 Other specified bacterial agents as the cause of diseases classified elsewhere: Secondary | ICD-10-CM | POA: Diagnosis not present

## 2019-06-24 DIAGNOSIS — Z79891 Long term (current) use of opiate analgesic: Secondary | ICD-10-CM | POA: Diagnosis not present

## 2019-06-24 DIAGNOSIS — E7800 Pure hypercholesterolemia, unspecified: Secondary | ICD-10-CM | POA: Diagnosis not present

## 2019-06-24 DIAGNOSIS — R7881 Bacteremia: Secondary | ICD-10-CM | POA: Diagnosis not present

## 2019-06-24 DIAGNOSIS — Z792 Long term (current) use of antibiotics: Secondary | ICD-10-CM | POA: Diagnosis not present

## 2019-06-24 DIAGNOSIS — B953 Streptococcus pneumoniae as the cause of diseases classified elsewhere: Secondary | ICD-10-CM | POA: Diagnosis not present

## 2019-06-24 DIAGNOSIS — Z87891 Personal history of nicotine dependence: Secondary | ICD-10-CM | POA: Diagnosis not present

## 2019-06-24 DIAGNOSIS — Z452 Encounter for adjustment and management of vascular access device: Secondary | ICD-10-CM | POA: Diagnosis not present

## 2019-06-25 DIAGNOSIS — R112 Nausea with vomiting, unspecified: Secondary | ICD-10-CM | POA: Diagnosis not present

## 2019-06-25 DIAGNOSIS — B953 Streptococcus pneumoniae as the cause of diseases classified elsewhere: Secondary | ICD-10-CM | POA: Diagnosis not present

## 2019-06-25 DIAGNOSIS — Z452 Encounter for adjustment and management of vascular access device: Secondary | ICD-10-CM | POA: Diagnosis not present

## 2019-06-25 DIAGNOSIS — R7881 Bacteremia: Secondary | ICD-10-CM | POA: Diagnosis not present

## 2019-06-25 DIAGNOSIS — T8142XA Infection following a procedure, deep incisional surgical site, initial encounter: Secondary | ICD-10-CM | POA: Diagnosis not present

## 2019-06-25 DIAGNOSIS — B9689 Other specified bacterial agents as the cause of diseases classified elsewhere: Secondary | ICD-10-CM | POA: Diagnosis not present

## 2019-06-25 DIAGNOSIS — T8132XA Disruption of internal operation (surgical) wound, not elsewhere classified, initial encounter: Secondary | ICD-10-CM | POA: Diagnosis not present

## 2019-07-02 DIAGNOSIS — R112 Nausea with vomiting, unspecified: Secondary | ICD-10-CM | POA: Diagnosis not present

## 2019-07-03 DIAGNOSIS — Z951 Presence of aortocoronary bypass graft: Secondary | ICD-10-CM | POA: Diagnosis not present

## 2019-07-03 DIAGNOSIS — T8132XD Disruption of internal operation (surgical) wound, not elsewhere classified, subsequent encounter: Secondary | ICD-10-CM | POA: Diagnosis not present

## 2019-07-17 ENCOUNTER — Encounter: Payer: Self-pay | Admitting: Sports Medicine

## 2019-07-17 ENCOUNTER — Other Ambulatory Visit: Payer: Self-pay

## 2019-07-17 ENCOUNTER — Ambulatory Visit (INDEPENDENT_AMBULATORY_CARE_PROVIDER_SITE_OTHER): Payer: Medicare Other | Admitting: Sports Medicine

## 2019-07-17 DIAGNOSIS — M5416 Radiculopathy, lumbar region: Secondary | ICD-10-CM

## 2019-07-17 DIAGNOSIS — Z951 Presence of aortocoronary bypass graft: Secondary | ICD-10-CM | POA: Diagnosis not present

## 2019-07-17 DIAGNOSIS — F329 Major depressive disorder, single episode, unspecified: Secondary | ICD-10-CM | POA: Diagnosis not present

## 2019-07-17 DIAGNOSIS — F419 Anxiety disorder, unspecified: Secondary | ICD-10-CM

## 2019-07-17 DIAGNOSIS — F32A Depression, unspecified: Secondary | ICD-10-CM

## 2019-07-17 MED ORDER — PAROXETINE HCL 20 MG PO TABS: 20.0000 mg | ORAL_TABLET | Freq: Every day | ORAL | 3 refills | Status: DC

## 2019-07-17 NOTE — Assessment & Plan Note (Signed)
Multilevel spinal stenosis worst at L2-L3 and also severe at L3-L4. We had discussed right L2-L3 and right L3-L4 transforaminal epidurals with Dr. Francesco Runner but he ended up having an MI with CABG. We will put this on the back burner for now and revisit this when his sternal dehiscence is healed.

## 2019-07-17 NOTE — Assessment & Plan Note (Addendum)
Sternal dehiscence, it does sound as though he is getting a bit of a sternal click, I have advised him that he really needs to follow back up with his cardiothoracic surgeon to discuss this again, rather than me, his cardiologist, plastic surgeon, or infectious disease provider.

## 2019-07-17 NOTE — Assessment & Plan Note (Signed)
Post MI depression and PTSD, increasing paroxetine to 20 mg daily, return in a month. Continue behavioral therapy.
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
## 2019-07-19 ENCOUNTER — Telehealth: Payer: Self-pay | Admitting: Sports Medicine

## 2019-07-19 NOTE — Telephone Encounter (Signed)
Switched to [employer]. Patient aware.

## 2019-07-19 NOTE — Telephone Encounter (Signed)
PT came into the office requesting a change in pharmacy for the medications prescribed on 07/17/19. Please Advise.  New Pharmacy: [facility] [place]

## 2019-07-24 ENCOUNTER — Other Ambulatory Visit: Payer: Self-pay | Admitting: Sports Medicine

## 2019-07-24 MED ORDER — HYDROXYZINE HCL 50 MG PO TABS: 50.0000 mg | ORAL_TABLET | Freq: Every day | ORAL | 3 refills | Status: DC

## 2019-07-25 ENCOUNTER — Other Ambulatory Visit: Payer: Self-pay

## 2019-08-14 ENCOUNTER — Ambulatory Visit (INDEPENDENT_AMBULATORY_CARE_PROVIDER_SITE_OTHER): Payer: Medicare Other

## 2019-08-14 ENCOUNTER — Encounter: Payer: Self-pay | Admitting: Sports Medicine

## 2019-08-14 ENCOUNTER — Other Ambulatory Visit: Payer: Self-pay

## 2019-08-14 ENCOUNTER — Ambulatory Visit (INDEPENDENT_AMBULATORY_CARE_PROVIDER_SITE_OTHER): Payer: Medicare Other | Admitting: Sports Medicine

## 2019-08-14 DIAGNOSIS — F32A Depression, unspecified: Secondary | ICD-10-CM

## 2019-08-14 DIAGNOSIS — J449 Chronic obstructive pulmonary disease, unspecified: Secondary | ICD-10-CM

## 2019-08-14 DIAGNOSIS — F329 Major depressive disorder, single episode, unspecified: Secondary | ICD-10-CM | POA: Diagnosis not present

## 2019-08-14 DIAGNOSIS — F419 Anxiety disorder, unspecified: Secondary | ICD-10-CM

## 2019-08-14 MED ORDER — PAROXETINE HCL 40 MG PO TABS: 40.0000 mg | ORAL_TABLET | Freq: Every day | ORAL | 3 refills | Status: DC

## 2019-08-14 MED ORDER — PREDNISONE 50 MG PO TABS: 50.0000 mg | ORAL_TABLET | Freq: Every day | ORAL | 0 refills | Status: DC

## 2019-08-14 MED ORDER — AZITHROMYCIN 250 MG PO TABS: ORAL_TABLET | ORAL | 0 refills | Status: DC

## 2019-08-14 MED ORDER — ALBUTEROL SULFATE (2.5 MG/3ML) 0.083% IN NEBU: 2.5000 mg | INHALATION_SOLUTION | Freq: Four times a day (QID) | RESPIRATORY_TRACT | 1 refills | Status: DC | PRN

## 2019-08-14 NOTE — Assessment & Plan Note (Signed)
Currently mild exacerbation, refilling albuterol for nebulizer, azithromycin, prednisone, chest x-ray.

## 2019-08-14 NOTE — Progress Notes (Signed)
Subjective:    CC: Follow-up  HPI: Mood disorder: Not much better with 20 of Paxil  Back pain: Understands he will need some epidurals, he does have severe spinal stenosis but needs to hold off on injections until he gets new insurance in 2021.  Shortness of breath: Known history of COPD, no longer smoking, now having increasing shortness of breath, pain with deep breathing.  Cough, no fevers or chills.  I reviewed the past medical history, family history, social history, surgical history, and allergies today and no changes were needed.  Please see the problem list section below in epic for further details.  Past Medical History: Past Medical History:  Diagnosis Date  . COPD (chronic obstructive pulmonary disease) (HCC)   . History of concussion    yrs ago-- no residual  . Hypertension   . OA (osteoarthritis)   . Right hydrocele    Past Surgical History: Past Surgical History:  Procedure Laterality Date  . FEMUR FRACTURE SURGERY  1985  . HYDROCELE EXCISION Right 05/23/2015   Procedure: RIGHT HYDROCELECTOMY ADULT;  Surgeon: Ihor Gully, MD;  Location: [facility];  Service: Urology;  Laterality: Right;  . KNEE SURGERY Bilateral x5 last one 2003  . TOTAL HIP ARTHROPLASTY Right 02-28-2015   Social History: Social History   Socioeconomic History  . Marital status: Married    Spouse name: Not on file  . Number of children: Not on file  . Years of education: Not on file  . Highest education level: Not on file  Occupational History  . Not on file  Tobacco Use  . Smoking status: Current Every Day Smoker    Packs/day: 0.50    Years: 40.00    Pack years: 20.00    Types: Cigarettes  . Smokeless tobacco: Former Neurosurgeon    Types: Chew  Substance and Sexual Activity  . Alcohol use: No  . Drug use: No  . Sexual activity: Not on file  Other Topics Concern  . Not on file  Social History Narrative  . Not on file   Social Determinants of Health   Financial  Resource Strain:   . Difficulty of Paying Living Expenses: Not on file  Food Insecurity:   . Worried About Programme researcher, broadcasting/film/video in the Last Year: Not on file  . Ran Out of Food in the Last Year: Not on file  Transportation Needs:   . Lack of Transportation (Medical): Not on file  . Lack of Transportation (Non-Medical): Not on file  Physical Activity:   . Days of Exercise per Week: Not on file  . Minutes of Exercise per Session: Not on file  Stress:   . Feeling of Stress : Not on file  Social Connections:   . Frequency of Communication with Friends and Family: Not on file  . Frequency of Social Gatherings with Friends and Family: Not on file  . Attends Religious Services: Not on file  . Active Member of Clubs or Organizations: Not on file  . Attends Banker Meetings: Not on file  . Marital Status: Not on file   Family History: Family History  Problem Relation Age of Onset  . Dementia Mother   . Colon cancer Father   . Cancer Paternal Grandmother    Allergies: No Known Allergies Medications: See med rec.  Review of Systems: No fevers, chills, night sweats, weight loss, chest pain, or shortness of breath.   Objective:    General: Well Developed, well nourished, and  in no acute distress.  Neuro: Alert and oriented x3, extra-ocular muscles intact, sensation grossly intact.  HEENT: Normocephalic, atraumatic, pupils equal round reactive to light, neck supple, no masses, no lymphadenopathy, thyroid nonpalpable.  Skin: Warm and dry, no rashes. Cardiac: Regular rate and rhythm, no murmurs rubs or gallops, no lower extremity edema.  Respiratory: No adventitial sounds but poor air movement throughout the lung fields. Not using accessory muscles, speaking in full sentences.  Impression and Recommendations:    Anxiety and depression Increasing Paxil to 40 mg daily. Return in 1 month for a PHQ and GAD.   COPD, moderate Currently mild exacerbation, refilling albuterol  for nebulizer, azithromycin, prednisone, chest x-ray.   ___________________________________________ Gwen Her. Dianah Field, M.D., ABFM., CAQSM. Primary Care and Sports Medicine [place] [facility]  Adjunct Professor of [place] [employer] [facility]

## 2019-08-14 NOTE — Assessment & Plan Note (Signed)
Increasing Paxil to 40 mg daily. Return in 1 month for a PHQ and GAD.

## 2019-09-10 ENCOUNTER — Other Ambulatory Visit: Payer: Self-pay | Admitting: Sports Medicine

## 2019-09-10 DIAGNOSIS — J449 Chronic obstructive pulmonary disease, unspecified: Secondary | ICD-10-CM

## 2019-09-10 MED ORDER — ATORVASTATIN CALCIUM 80 MG PO TABS: 80.0000 mg | ORAL_TABLET | Freq: Every day | ORAL | 3 refills | Status: DC

## 2019-09-11 ENCOUNTER — Ambulatory Visit: Payer: Medicare Other | Admitting: Sports Medicine

## 2019-09-13 DIAGNOSIS — J449 Chronic obstructive pulmonary disease, unspecified: Secondary | ICD-10-CM | POA: Diagnosis not present

## 2019-09-14 DIAGNOSIS — R05 Cough: Secondary | ICD-10-CM | POA: Diagnosis not present

## 2019-09-14 DIAGNOSIS — R0602 Shortness of breath: Secondary | ICD-10-CM | POA: Diagnosis not present

## 2019-09-14 DIAGNOSIS — J80 Acute respiratory distress syndrome: Secondary | ICD-10-CM | POA: Diagnosis not present

## 2019-09-14 DIAGNOSIS — R069 Unspecified abnormalities of breathing: Secondary | ICD-10-CM | POA: Diagnosis not present

## 2019-09-14 DIAGNOSIS — R0689 Other abnormalities of breathing: Secondary | ICD-10-CM | POA: Diagnosis not present

## 2019-09-15 DIAGNOSIS — I10 Essential (primary) hypertension: Secondary | ICD-10-CM | POA: Diagnosis not present

## 2019-09-15 DIAGNOSIS — Z7982 Long term (current) use of aspirin: Secondary | ICD-10-CM | POA: Diagnosis not present

## 2019-09-15 DIAGNOSIS — R9431 Abnormal electrocardiogram [ECG] [EKG]: Secondary | ICD-10-CM | POA: Diagnosis not present

## 2019-09-15 DIAGNOSIS — Z743 Need for continuous supervision: Secondary | ICD-10-CM | POA: Diagnosis not present

## 2019-09-15 DIAGNOSIS — R079 Chest pain, unspecified: Secondary | ICD-10-CM | POA: Diagnosis not present

## 2019-09-15 DIAGNOSIS — J441 Chronic obstructive pulmonary disease with (acute) exacerbation: Secondary | ICD-10-CM | POA: Diagnosis not present

## 2019-09-15 DIAGNOSIS — Z20822 Contact with and (suspected) exposure to covid-19: Secondary | ICD-10-CM | POA: Diagnosis not present

## 2019-09-15 DIAGNOSIS — Z79899 Other long term (current) drug therapy: Secondary | ICD-10-CM | POA: Diagnosis not present

## 2019-09-15 DIAGNOSIS — J168 Pneumonia due to other specified infectious organisms: Secondary | ICD-10-CM | POA: Diagnosis not present

## 2019-09-15 DIAGNOSIS — R0602 Shortness of breath: Secondary | ICD-10-CM | POA: Diagnosis not present

## 2019-09-15 DIAGNOSIS — Z87891 Personal history of nicotine dependence: Secondary | ICD-10-CM | POA: Diagnosis not present

## 2019-09-15 DIAGNOSIS — R069 Unspecified abnormalities of breathing: Secondary | ICD-10-CM | POA: Diagnosis not present

## 2019-09-15 MED ORDER — GENERIC EXTERNAL MEDICATION
Status: DC
Start: ? — End: 2019-09-15

## 2019-09-15 MED ORDER — SODIUM CHLORIDE 0.9 % IV SOLN
10.00 | INTRAVENOUS | Status: DC
Start: ? — End: 2019-09-15

## 2019-09-24 ENCOUNTER — Ambulatory Visit: Payer: Medicare Other | Admitting: Sports Medicine

## 2019-09-26 ENCOUNTER — Ambulatory Visit: Payer: Medicare Other | Admitting: Sports Medicine

## 2019-09-28 ENCOUNTER — Encounter: Payer: Self-pay | Admitting: Sports Medicine

## 2019-09-28 ENCOUNTER — Other Ambulatory Visit: Payer: Self-pay

## 2019-09-28 ENCOUNTER — Ambulatory Visit (INDEPENDENT_AMBULATORY_CARE_PROVIDER_SITE_OTHER): Payer: Medicare Other | Admitting: Sports Medicine

## 2019-09-28 DIAGNOSIS — F419 Anxiety disorder, unspecified: Secondary | ICD-10-CM

## 2019-09-28 DIAGNOSIS — F32A Depression, unspecified: Secondary | ICD-10-CM

## 2019-09-28 DIAGNOSIS — I10 Essential (primary) hypertension: Secondary | ICD-10-CM

## 2019-09-28 DIAGNOSIS — F329 Major depressive disorder, single episode, unspecified: Secondary | ICD-10-CM

## 2019-09-28 DIAGNOSIS — J449 Chronic obstructive pulmonary disease, unspecified: Secondary | ICD-10-CM

## 2019-09-28 MED ORDER — BEVESPI AEROSPHERE 9-4.8 MCG/ACT IN AERO: 1.0000 | INHALATION_SPRAY | Freq: Every day | RESPIRATORY_TRACT | 11 refills | Status: DC

## 2019-09-28 MED ORDER — LISINOPRIL 40 MG PO TABS: 40.0000 mg | ORAL_TABLET | Freq: Every day | ORAL | 3 refills | Status: DC

## 2019-09-28 MED ORDER — IPRATROPIUM-ALBUTEROL 0.5-2.5 (3) MG/3ML IN SOLN: 3.0000 mL | Freq: Four times a day (QID) | RESPIRATORY_TRACT | 0 refills | Status: DC | PRN
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
## 2019-09-28 NOTE — Assessment & Plan Note (Addendum)
Uncontrolled, no headaches, chest pain, visual changes, increasing lisinopril to 40 mg daily, we can recheck his blood pressure in 2 weeks when he comes back for pre and postbronchodilator spirometry
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
## 2019-09-28 NOTE — Assessment & Plan Note (Signed)
Christopher Robertson is noncompliant with his medications. He is supposed to be on Bevespi as well as albuterol and a nebulizer for as needed use. He has not been taking the Bevespi, ended up in the ED recently. I'm going to call in Christopher Robertson again, we are switching him to Christopher Robertson for emergency use. He did enroll in a clinical study and needs repeat lung function tests. We'll bring him back in 2 weeks with this.

## 2019-10-12 ENCOUNTER — Other Ambulatory Visit: Payer: Medicare Other

## 2019-10-12 NOTE — Telephone Encounter (Signed)
Lets do a virtual maybe next week to further evaluate exactly where his symptoms are going and that helps me decide where the injection will be placed and what level in the L-spine.

## 2019-10-14 DIAGNOSIS — J449 Chronic obstructive pulmonary disease, unspecified: Secondary | ICD-10-CM | POA: Diagnosis not present

## 2019-10-23 ENCOUNTER — Ambulatory Visit (INDEPENDENT_AMBULATORY_CARE_PROVIDER_SITE_OTHER): Payer: Medicare Other | Admitting: Sports Medicine

## 2019-10-23 ENCOUNTER — Other Ambulatory Visit: Payer: Self-pay

## 2019-10-23 VITALS — BP 121/77 | HR 82 | Ht 72.0 in | Wt 261.0 lb

## 2019-10-23 DIAGNOSIS — M4807 Spinal stenosis, lumbosacral region: Secondary | ICD-10-CM

## 2019-10-23 DIAGNOSIS — M48062 Spinal stenosis, lumbar region with neurogenic claudication: Secondary | ICD-10-CM

## 2019-10-23 DIAGNOSIS — I10 Essential (primary) hypertension: Secondary | ICD-10-CM | POA: Diagnosis not present

## 2019-10-23 DIAGNOSIS — J449 Chronic obstructive pulmonary disease, unspecified: Secondary | ICD-10-CM

## 2019-10-23 MED ORDER — PREDNISONE 50 MG PO TABS: ORAL_TABLET | ORAL | 0 refills | Status: DC

## 2019-10-23 MED ORDER — ALBUTEROL SULFATE HFA 108 (90 BASE) MCG/ACT IN AERS
2.0000 | INHALATION_SPRAY | Freq: Once | RESPIRATORY_TRACT | Status: AC
  Administered 2019-10-23: 16:00:00 2 via RESPIRATORY_TRACT

## 2019-10-23 MED ORDER — ROLLATOR ULTRA-LIGHT MISC: 0 refills | Status: DC

## 2019-10-23 NOTE — Assessment & Plan Note (Signed)
Pre and postbronchodilator spirometry was performed today, spirometry shows an FEV1 over FVC of 60% prebronchodilator and 63% postbronchodilator.  These both point towards relatively irreversible obstructive lung disease. Continue his inhalers including Bevespi with DuoNebs for emergency use, he is in a clinical study that required these lung function test.

## 2019-10-23 NOTE — Assessment & Plan Note (Signed)
Pace has had difficult to control hypertension.   Well-controlled on current medications now.

## 2019-10-23 NOTE — Addendum Note (Signed)
Addended by: Monica Becton on: 10/23/2019 04:09 PM   Modules accepted: Orders

## 2019-10-23 NOTE — Progress Notes (Addendum)
    Procedures performed today:    Pre and postbronchodilator spirometry was performed today, spirometry shows an FEV1 over FVC of 60% prebronchodilator and 63% postbronchodilator.  These both point towards relatively irreversible obstructive lung disease.  Independent interpretation of tests performed by another provider:   Foster had an MRI of the cervical, thoracic, and lumbar spine in 2019, cervical and thoracic spine MRIs showed mild degenerative changes, he did have severe spinal stenosis at multiple levels in his lumbar spine MRI.  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  Impression and Recommendations:    Lumbar spinal stenosis [place] is starting to have progressive weakness with his lumbar spinal stenosis, he has had several falls, ambulates with a cane. On further review of his history he has an MRI showing severe spinal stenosis in the lumbar spine. He also has a nerve conduction study from 2019 that shows electrodiagnostic evidence of length dependent mild axonal peripheral neuropathy with evidence of mild chronic lumbosacral radiculopathy mainly involving bilateral L4-L5 and S1 myotomes. We are going to get an updated MRI and I would like an urgent consult with Dr. Yevette Edwards to discuss decompression. Adding 5 days of prednisone in the meantime.  COPD, moderate (HCC) Pre and postbronchodilator spirometry was performed today, spirometry shows an FEV1 over FVC of 60% prebronchodilator and 63% postbronchodilator.  These both point towards relatively irreversible obstructive lung disease. Continue his inhalers including Bevespi with DuoNebs for emergency use, he is in a clinical study that required these lung function test.  Hypertension [place] has had difficult to control hypertension.   Well-controlled on  current medications now.    ___________________________________________ Ihor Austin. Benjamin Stain, M.D., ABFM., CAQSM. Primary Care and Sports Medicine [place] [facility]  Adjunct Instructor of Family Medicine  [employer]

## 2019-10-23 NOTE — Assessment & Plan Note (Addendum)
Christopher Robertson is starting to have progressive weakness with his lumbar spinal stenosis, he has had several falls, ambulates with a cane. On further review of his history he has an MRI showing severe spinal stenosis in the lumbar spine. He also has a nerve conduction study from 2019 that shows electrodiagnostic evidence of length dependent mild axonal peripheral neuropathy with evidence of mild chronic lumbosacral radiculopathy mainly involving bilateral L4-L5 and S1 myotomes. We are going to get an updated MRI and I would like an urgent consult with Dr. Yevette Edwards to discuss decompression. Adding 5 days of prednisone in the meantime.

## 2019-10-26 ENCOUNTER — Telehealth: Payer: Self-pay | Admitting: *Deleted

## 2019-10-26 DIAGNOSIS — M48062 Spinal stenosis, lumbar region with neurogenic claudication: Secondary | ICD-10-CM

## 2019-10-26 MED ORDER — TRIAZOLAM 0.25 MG PO TABS: ORAL_TABLET | ORAL | 0 refills | Status: DC

## 2019-10-26 NOTE — Telephone Encounter (Signed)
Before we do that he would need a driver, does he have someone in mind?

## 2019-10-26 NOTE — Telephone Encounter (Signed)
Triazolam sent in. 

## 2019-10-26 NOTE — Telephone Encounter (Signed)
Spoke with pt and he said he will get a taxi to drive him to his mri.  Stated he doesn't even have a car.

## 2019-10-26 NOTE — Telephone Encounter (Signed)
Pt left vm stating that he needs something to calm him for his MRI this Sunday.

## 2019-10-28 ENCOUNTER — Other Ambulatory Visit: Payer: Self-pay

## 2019-10-28 ENCOUNTER — Ambulatory Visit (INDEPENDENT_AMBULATORY_CARE_PROVIDER_SITE_OTHER): Payer: Medicare Other

## 2019-10-28 DIAGNOSIS — M4807 Spinal stenosis, lumbosacral region: Secondary | ICD-10-CM

## 2019-10-28 DIAGNOSIS — M48062 Spinal stenosis, lumbar region with neurogenic claudication: Secondary | ICD-10-CM

## 2019-10-28 DIAGNOSIS — M48061 Spinal stenosis, lumbar region without neurogenic claudication: Secondary | ICD-10-CM | POA: Diagnosis not present

## 2019-11-05 DIAGNOSIS — M48061 Spinal stenosis, lumbar region without neurogenic claudication: Secondary | ICD-10-CM | POA: Diagnosis not present

## 2019-11-09 NOTE — Addendum Note (Signed)
Addended by: Barry Dienes A on: 11/09/2019 09:24 AM   Modules accepted: Orders

## 2019-11-11 DIAGNOSIS — J449 Chronic obstructive pulmonary disease, unspecified: Secondary | ICD-10-CM | POA: Diagnosis not present

## 2019-11-12 DIAGNOSIS — R6889 Other general symptoms and signs: Secondary | ICD-10-CM | POA: Diagnosis not present

## 2019-11-15 ENCOUNTER — Telehealth: Payer: Self-pay | Admitting: *Deleted

## 2019-11-15 ENCOUNTER — Other Ambulatory Visit: Payer: Self-pay | Admitting: *Deleted

## 2019-11-15 DIAGNOSIS — F329 Major depressive disorder, single episode, unspecified: Secondary | ICD-10-CM

## 2019-11-15 DIAGNOSIS — F419 Anxiety disorder, unspecified: Secondary | ICD-10-CM

## 2019-11-15 DIAGNOSIS — F32A Depression, unspecified: Secondary | ICD-10-CM

## 2019-11-15 NOTE — Telephone Encounter (Signed)
Oh my word.  Please just let him know to call 911 if he develops recurrent thoughts of suicide or a plan (and document this of course).  Also if he doesn't have an appointment with psychiatry yet I'm happy to place a referral.

## 2019-11-15 NOTE — Telephone Encounter (Signed)
Talked with pt for a very long time this afternoon regarding everything.  Most of his thought process on suicide is coming from the pain he's in.  He's been unable to get the rolling walker so I told him to let us know where his ins prefers him to get it from and we would take care of that.  He is very okay with you putting in a referral for him to talk to someone.  I advised him that it would be done virtually so he wouldn't have to worry about finding transportation.  He is going to call tomorrow to reschedule his epidural as well. He stated that he's "at the point where he knows he CAN take himself out but that he WONT".
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
## 2019-11-16 NOTE — Telephone Encounter (Signed)
Urgent referral placed to psychiatry.

## 2019-11-20 ENCOUNTER — Ambulatory Visit (INDEPENDENT_AMBULATORY_CARE_PROVIDER_SITE_OTHER): Payer: Medicare Other | Admitting: Sports Medicine

## 2019-11-20 ENCOUNTER — Other Ambulatory Visit: Payer: Self-pay

## 2019-11-20 ENCOUNTER — Encounter: Payer: Self-pay | Admitting: Sports Medicine

## 2019-11-20 DIAGNOSIS — I10 Essential (primary) hypertension: Secondary | ICD-10-CM | POA: Diagnosis not present

## 2019-11-20 DIAGNOSIS — F419 Anxiety disorder, unspecified: Secondary | ICD-10-CM

## 2019-11-20 DIAGNOSIS — R6889 Other general symptoms and signs: Secondary | ICD-10-CM | POA: Diagnosis not present

## 2019-11-20 DIAGNOSIS — M48062 Spinal stenosis, lumbar region with neurogenic claudication: Secondary | ICD-10-CM | POA: Diagnosis not present

## 2019-11-20 DIAGNOSIS — Z299 Encounter for prophylactic measures, unspecified: Secondary | ICD-10-CM | POA: Diagnosis not present

## 2019-11-20 DIAGNOSIS — F329 Major depressive disorder, single episode, unspecified: Secondary | ICD-10-CM

## 2019-11-20 DIAGNOSIS — F32A Depression, unspecified: Secondary | ICD-10-CM

## 2019-11-20 NOTE — Assessment & Plan Note (Signed)
Never got his Cologuard done, reordering this.

## 2019-11-20 NOTE — Assessment & Plan Note (Signed)
Christopher Robertson continues on his antidepressants, he did have a few episodes of suicidal ideation but today tells me he would never carry this out, and he does contract for safety. Because his pain has gotten significantly better with the prednisone he tells me his mood has improved significantly as well. I would still like him to touch base with psychiatry.

## 2019-11-20 NOTE — Assessment & Plan Note (Signed)
Sharma Covert returns, he had some progressive weakness in his lumbar spine, he has had cervical, thoracic, lumbar spine MRIs that show several areas of cervical spinal stenosis but no obvious areas of myelomalacia. He did have a nerve conduction study that showed mild axonal peripheral neuropathy with evidence of mild chronic lumbosacral radiculopathy, mostly the bilateral L4, L5, and S1 myotomes. His updated MRI from the last visit shows similar. At the last visit we added 5 days of prednisone he returns today pain-free. He is happy with how things are going, and as we are going to likely need to use a nonsurgical approach he will proceed with his epidural. I can also do occasional bursts of prednisone a few times a year if needed.

## 2019-11-20 NOTE — Progress Notes (Signed)
    Procedures performed today:    None.  Independent interpretation of notes and tests performed by another provider:   Personally reviewed his lumbar, cervical, thoracic MRIs, thoracic spine looks okay, multilevel cervical central canal stenosis without myomalacia, lumbar spine MRI shows moderate to severe spinal stenosis at L2-L3 and L3-L4.  Impression and Recommendations:    Lumbar spinal stenosis Sharma Covert returns, he had some progressive weakness in his lumbar spine, he has had cervical, thoracic, lumbar spine MRIs that show several areas of cervical spinal stenosis but no obvious areas of myelomalacia. He did have a nerve conduction study that showed mild axonal peripheral neuropathy with evidence of mild chronic lumbosacral radiculopathy, mostly the bilateral L4, L5, and S1 myotomes. His updated MRI from the last visit shows similar. At the last visit we added 5 days of prednisone he returns today pain-free. He is happy with how things are going, and as we are going to likely need to use a nonsurgical approach he will proceed with his epidural. I can also do occasional bursts of prednisone a few times a year if needed.  Anxiety and depression Blase continues on his antidepressants, he did have a few episodes of suicidal ideation but today tells me he would never carry this out, and he does contract for safety. Because his pain has gotten significantly better with the prednisone he tells me his mood has improved significantly as well. I would still like him to touch base with psychiatry.  Preventive measure Never got his Cologuard done, reordering this.    ___________________________________________ Ihor Austin. Benjamin Stain, M.D., ABFM., CAQSM. Primary Care and Sports Medicine [place] [facility]  Adjunct Instructor of Family Medicine  [employer]

## 2019-11-21 MED ORDER — LISINOPRIL 40 MG PO TABS: 40.0000 mg | ORAL_TABLET | Freq: Every day | ORAL | 3 refills | Status: DC

## 2019-11-21 MED ORDER — PAROXETINE HCL 40 MG PO TABS: 40.0000 mg | ORAL_TABLET | Freq: Every day | ORAL | 3 refills | Status: DC

## 2019-11-21 NOTE — Addendum Note (Signed)
Addended by: Monica Becton on: 11/21/2019 05:04 PM   Modules accepted: Orders

## 2019-12-12 DIAGNOSIS — J449 Chronic obstructive pulmonary disease, unspecified: Secondary | ICD-10-CM | POA: Diagnosis not present

## 2019-12-28 ENCOUNTER — Other Ambulatory Visit: Payer: Self-pay | Admitting: Sports Medicine

## 2019-12-28 DIAGNOSIS — J449 Chronic obstructive pulmonary disease, unspecified: Secondary | ICD-10-CM

## 2020-01-09 DIAGNOSIS — I10 Essential (primary) hypertension: Secondary | ICD-10-CM | POA: Diagnosis not present

## 2020-01-09 DIAGNOSIS — R0789 Other chest pain: Secondary | ICD-10-CM | POA: Diagnosis not present

## 2020-01-09 DIAGNOSIS — J9601 Acute respiratory failure with hypoxia: Secondary | ICD-10-CM | POA: Diagnosis not present

## 2020-01-09 DIAGNOSIS — R079 Chest pain, unspecified: Secondary | ICD-10-CM | POA: Diagnosis not present

## 2020-01-09 DIAGNOSIS — Z87891 Personal history of nicotine dependence: Secondary | ICD-10-CM | POA: Diagnosis not present

## 2020-01-09 DIAGNOSIS — Z7951 Long term (current) use of inhaled steroids: Secondary | ICD-10-CM | POA: Diagnosis not present

## 2020-01-09 DIAGNOSIS — I4510 Unspecified right bundle-branch block: Secondary | ICD-10-CM | POA: Diagnosis not present

## 2020-01-09 DIAGNOSIS — Z951 Presence of aortocoronary bypass graft: Secondary | ICD-10-CM | POA: Diagnosis not present

## 2020-01-09 DIAGNOSIS — R778 Other specified abnormalities of plasma proteins: Secondary | ICD-10-CM | POA: Diagnosis not present

## 2020-01-09 DIAGNOSIS — I517 Cardiomegaly: Secondary | ICD-10-CM | POA: Diagnosis not present

## 2020-01-09 DIAGNOSIS — R55 Syncope and collapse: Secondary | ICD-10-CM | POA: Diagnosis not present

## 2020-01-09 DIAGNOSIS — Z743 Need for continuous supervision: Secondary | ICD-10-CM | POA: Diagnosis not present

## 2020-01-09 DIAGNOSIS — J9811 Atelectasis: Secondary | ICD-10-CM | POA: Diagnosis not present

## 2020-01-09 DIAGNOSIS — R531 Weakness: Secondary | ICD-10-CM | POA: Diagnosis not present

## 2020-01-09 DIAGNOSIS — Z7982 Long term (current) use of aspirin: Secondary | ICD-10-CM | POA: Diagnosis not present

## 2020-01-09 DIAGNOSIS — T43621A Poisoning by amphetamines, accidental (unintentional), initial encounter: Secondary | ICD-10-CM | POA: Diagnosis not present

## 2020-01-09 DIAGNOSIS — J449 Chronic obstructive pulmonary disease, unspecified: Secondary | ICD-10-CM | POA: Diagnosis not present

## 2020-01-09 DIAGNOSIS — R404 Transient alteration of awareness: Secondary | ICD-10-CM | POA: Diagnosis not present

## 2020-01-09 DIAGNOSIS — R600 Localized edema: Secondary | ICD-10-CM | POA: Diagnosis not present

## 2020-01-09 DIAGNOSIS — T50901A Poisoning by unspecified drugs, medicaments and biological substances, accidental (unintentional), initial encounter: Secondary | ICD-10-CM | POA: Diagnosis not present

## 2020-01-09 DIAGNOSIS — J441 Chronic obstructive pulmonary disease with (acute) exacerbation: Secondary | ICD-10-CM | POA: Diagnosis not present

## 2020-01-09 DIAGNOSIS — G92 Toxic encephalopathy: Secondary | ICD-10-CM | POA: Diagnosis not present

## 2020-01-09 DIAGNOSIS — R0603 Acute respiratory distress: Secondary | ICD-10-CM | POA: Diagnosis not present

## 2020-01-09 DIAGNOSIS — Z791 Long term (current) use of non-steroidal anti-inflammatories (NSAID): Secondary | ICD-10-CM | POA: Diagnosis not present

## 2020-01-09 DIAGNOSIS — R7989 Other specified abnormal findings of blood chemistry: Secondary | ICD-10-CM | POA: Diagnosis not present

## 2020-01-09 DIAGNOSIS — E872 Acidosis: Secondary | ICD-10-CM | POA: Diagnosis not present

## 2020-01-09 DIAGNOSIS — I420 Dilated cardiomyopathy: Secondary | ICD-10-CM | POA: Diagnosis not present

## 2020-01-09 DIAGNOSIS — I2510 Atherosclerotic heart disease of native coronary artery without angina pectoris: Secondary | ICD-10-CM | POA: Diagnosis not present

## 2020-01-09 DIAGNOSIS — Z20822 Contact with and (suspected) exposure to covid-19: Secondary | ICD-10-CM | POA: Diagnosis not present

## 2020-01-09 DIAGNOSIS — R0681 Apnea, not elsewhere classified: Secondary | ICD-10-CM | POA: Diagnosis not present

## 2020-01-09 DIAGNOSIS — J439 Emphysema, unspecified: Secondary | ICD-10-CM | POA: Diagnosis not present

## 2020-01-09 DIAGNOSIS — R739 Hyperglycemia, unspecified: Secondary | ICD-10-CM | POA: Diagnosis not present

## 2020-01-09 DIAGNOSIS — R2243 Localized swelling, mass and lump, lower limb, bilateral: Secondary | ICD-10-CM | POA: Diagnosis not present

## 2020-01-09 DIAGNOSIS — I080 Rheumatic disorders of both mitral and aortic valves: Secondary | ICD-10-CM | POA: Diagnosis not present

## 2020-01-09 DIAGNOSIS — Z79899 Other long term (current) drug therapy: Secondary | ICD-10-CM | POA: Diagnosis not present

## 2020-01-09 DIAGNOSIS — J9602 Acute respiratory failure with hypercapnia: Secondary | ICD-10-CM | POA: Diagnosis not present

## 2020-01-09 DIAGNOSIS — N179 Acute kidney failure, unspecified: Secondary | ICD-10-CM | POA: Diagnosis not present

## 2020-01-14 ENCOUNTER — Encounter: Payer: Self-pay | Admitting: Physician Assistant

## 2020-01-14 ENCOUNTER — Encounter: Payer: Self-pay | Admitting: Sports Medicine

## 2020-01-14 ENCOUNTER — Telehealth (INDEPENDENT_AMBULATORY_CARE_PROVIDER_SITE_OTHER): Payer: Medicare Other | Admitting: Physician Assistant

## 2020-01-14 VITALS — BP 180/80 | Ht 72.0 in | Wt 260.0 lb

## 2020-01-14 DIAGNOSIS — N179 Acute kidney failure, unspecified: Secondary | ICD-10-CM | POA: Diagnosis not present

## 2020-01-14 DIAGNOSIS — T50901A Poisoning by unspecified drugs, medicaments and biological substances, accidental (unintentional), initial encounter: Secondary | ICD-10-CM | POA: Diagnosis not present

## 2020-01-14 DIAGNOSIS — J9601 Acute respiratory failure with hypoxia: Secondary | ICD-10-CM | POA: Insufficient documentation

## 2020-01-14 DIAGNOSIS — I10 Essential (primary) hypertension: Secondary | ICD-10-CM

## 2020-01-14 DIAGNOSIS — J9602 Acute respiratory failure with hypercapnia: Secondary | ICD-10-CM

## 2020-01-14 DIAGNOSIS — F1510 Other stimulant abuse, uncomplicated: Secondary | ICD-10-CM

## 2020-01-14 DIAGNOSIS — J449 Chronic obstructive pulmonary disease, unspecified: Secondary | ICD-10-CM | POA: Diagnosis not present

## 2020-01-14 MED ORDER — AMLODIPINE BESYLATE 10 MG PO TABS
10.00 | ORAL_TABLET | ORAL | Status: DC
Start: 2020-01-14 — End: 2020-01-14

## 2020-01-14 MED ORDER — CARVEDILOL 6.25 MG PO TABS
6.25 | ORAL_TABLET | ORAL | Status: DC
Start: 2020-01-13 — End: 2020-01-14

## 2020-01-14 MED ORDER — MUPIROCIN 2 % EX OINT
TOPICAL_OINTMENT | CUTANEOUS | Status: DC
Start: 2020-01-13 — End: 2020-01-14

## 2020-01-14 MED ORDER — UMECLIDINIUM-VILANTEROL 62.5-25 MCG/INH IN AEPB
1.00 | INHALATION_SPRAY | RESPIRATORY_TRACT | Status: DC
Start: 2020-01-14 — End: 2020-01-14

## 2020-01-14 MED ORDER — GENERIC EXTERNAL MEDICATION
Status: DC
Start: ? — End: 2020-01-14

## 2020-01-14 MED ORDER — LISINOPRIL 20 MG PO TABS
40.00 | ORAL_TABLET | ORAL | Status: DC
Start: 2020-01-14 — End: 2020-01-14

## 2020-01-14 MED ORDER — SODIUM CHLORIDE 0.9 % IV SOLN
10.00 | INTRAVENOUS | Status: DC
Start: ? — End: 2020-01-14

## 2020-01-14 MED ORDER — HEPARIN SODIUM (PORCINE) 5000 UNIT/ML IJ SOLN
5000.00 | INTRAMUSCULAR | Status: DC
Start: 2020-01-13 — End: 2020-01-14

## 2020-01-14 MED ORDER — PANTOPRAZOLE SODIUM 40 MG IV SOLR
40.00 | INTRAVENOUS | Status: DC
Start: 2020-01-14 — End: 2020-01-14

## 2020-01-14 MED ORDER — IBUPROFEN 600 MG PO TABS
600.00 | ORAL_TABLET | ORAL | Status: DC
Start: ? — End: 2020-01-14

## 2020-01-14 MED ORDER — PAROXETINE HCL 10 MG PO TABS
40.00 | ORAL_TABLET | ORAL | Status: DC
Start: 2020-01-14 — End: 2020-01-14

## 2020-01-14 MED ORDER — HYDRALAZINE HCL 20 MG/ML IJ SOLN
10.00 | INTRAMUSCULAR | Status: DC
Start: ? — End: 2020-01-14

## 2020-01-14 MED ORDER — CLONIDINE HCL 0.1 MG PO TABS
0.10 | ORAL_TABLET | ORAL | Status: DC
Start: ? — End: 2020-01-14

## 2020-01-14 NOTE — Progress Notes (Signed)
Discharged from hospital yesterday  - there 3 days Passed out Wednesday night ([facility] in [place], then transferred to [place])  Has had irregular blood pressures since discharge, has not restarted Amlodipine that was started in the hospital THIS AM: BP 204/90 Now: BP 180/60  No symptoms - chest pain, palpitations, dizziness, SOB

## 2020-01-14 NOTE — Progress Notes (Signed)
Patient ID: Christopher Robertson, male   DOB: 1951/07/05, 69 y.o.   MRN: [id number] .Marland KitchenVirtual Visit via Video Note  I connected with Christopher Robertson on 01/14/20 at  2:20 PM EDT by a video enabled telemedicine application and verified that I am speaking with the correct person using two identifiers.  Location: Patient: home Provider: home   I discussed the limitations of evaluation and management by telemedicine and the availability of in person appointments. The patient expressed understanding and agreed to proceed.  History of Present Illness: Pt is a 69 yo male with HTN, COPD who calls into the clinic for hospital follow up after accidental overdose. He was admitted on 5/12 and discharged on 5/16.   Christopher Robertson is a 69 y.o. obese Caucasian male history of coronary artery disease status post CABG, hypertension, COPD was brought into the emergency room after found unresponsive. Please note history of present illness obtained from review of medical records, ED notes and the very brief history patient was unable to give. During my evaluation patient was drowsy but arousable not in distress, slow to respond and was able to give a very short history of present illness. He claimed he had been feeling bad and passed out, admits to lower extremity edema for 2 weeks for which he sought no help otherwise patient went back to sleep. Per emergency room documentation he was found slumped over by family with O2 sats in the 56s EMS was called and gave him 4 mg of Narcan after which patient became responsive. He denies use of opiates or other recreational drugs. Work-up in the emergency room revealed a urine drug screen is positive for amphetamine, glucose of 201, creatinine of 1.38, leukocytosis of 12,000, troponin of 35 at baseline and repeat at 3 hours was 41 with a delta of 10. Covid screen is negative, chest x-ray showed emphysema without focal infiltrate, EKG showed normal sinus rhythm  with incomplete right bundle branch block. Hospitalist is consulted to admit for accidental drug overdose and acute respiratory failure with hypoxia. Patient was noticed to be snoring while sleeping by admitting ER doctor ABG was done that showed hypercapnia respiratory acidosis but ABG result is not available for review in the chart, So he was started on BiPAP.  He was started on norvasc for HTN. He has not started it.   He is unaware of smoking or taking any pills at party or how amphetamine got in his system. He was drinking an "organge drink" that people were drinking but does not know what was in it. The people that were there were doing pills and smoking.   He denies any SOB, wheezing, CP, palpitations, headaches. He is a little tired. He woke up and BP was 204/90. After BP medications went down. .   .. Active Ambulatory Problems    Diagnosis Date Noted  . Hydrocele, right 08/18/2012  . Hypertension 08/18/2012  . Smoker 08/18/2012  . Preventive measure 08/18/2012  . 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 COPD, moderate (HCC) 01/16/2015  . Insomnia 04/22/2015  . Lumbar spinal stenosis 08/02/2017  . Anxiety and depression 08/02/2017  . Vestibular dysfunction 10/04/2017  . Balance disorder 10/10/2017  . 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  . Callus of foot 02/12/2019  . Hx of CABG 06/19/2019  . Methamphetamine abuse (HCC) 01/14/2020  . Accidental drug overdose 01/14/2020  . Acute respiratory failure with hypoxia and hypercapnia (HCC) 01/14/2020   Resolved Ambulatory Problems    Diagnosis Date Noted  . Occult  blood positive stool 01/31/2014  . Otitis media 01/31/2014  . Pre-operative clearance 01/16/2015  . Hip joint replacement status 03/12/2015  . Hip pain 03/12/2015  . Leg swelling 03/12/2015  . Memory loss 10/10/2017  . Gait abnormality 11/30/2017  . Low back pain without sciatica 01/06/2018  . Psychosis ([place]) 01/02/2019  . Fall 02/12/2019   Past Medical  History:  Diagnosis Date  . COPD (chronic obstructive pulmonary disease) ([place])   . History of concussion   . OA (osteoarthritis)   . Right hydrocele    Reviewed med, allergy, problem list.        Observations/Objective: No acute distress Normal breathing.  Normal mood and appearance.   .. Today's Vitals   01/14/20 1408  BP: (!) 180/80  Weight: 260 lb (117.9 kg)  Height: 6' (1.829 m)   Body mass index is 35.26 kg/m.    Assessment and Plan: Marland KitchenMarland KitchenDarwin was seen today for blood pressure check.  Diagnoses and all orders for this visit:  Acute respiratory failure with hypoxia and hypercapnia (HCC) -     CBC with Differential/Platelet -     COMPLETE METABOLIC PANEL WITH GFR  COPD, moderate (HCC)  Methamphetamine abuse (HCC) -     CBC with Differential/Platelet -     COMPLETE METABOLIC PANEL WITH GFR  Accidental drug overdose, initial encounter -     CBC with Differential/Platelet -     COMPLETE METABOLIC PANEL WITH GFR  AKI (acute kidney injury) ([place]) -     COMPLETE METABOLIC PANEL WITH GFR  Essential hypertension    Report BP readings daily for the next 3 days.  Start 1/2 tablet of norvasc daily. BP did go down after took medications this am but still no to goal.   Get labs to recheck CBC and CMP.  Restart bevespi for COPD.  Follow up with Dr. Darene Lamer in 1 week.  Be aware of what you drink or take in presence of people you do not know.    Follow Up Instructions:    I discussed the assessment and treatment plan with the patient. The patient was provided an opportunity to ask questions and all were answered. The patient agreed with the plan and demonstrated an understanding of the instructions.   The patient was advised to call back or seek an in-person evaluation if the symptoms worsen or if the condition fails to improve as anticipated.  I provided 25 minutes of non-face-to-face time during this encounter.   Iran Planas, PA-C

## 2020-01-15 DIAGNOSIS — M48061 Spinal stenosis, lumbar region without neurogenic claudication: Secondary | ICD-10-CM | POA: Diagnosis not present

## 2020-02-15 ENCOUNTER — Ambulatory Visit (INDEPENDENT_AMBULATORY_CARE_PROVIDER_SITE_OTHER): Payer: Medicare Other | Admitting: Sports Medicine

## 2020-02-15 ENCOUNTER — Encounter: Payer: Self-pay | Admitting: Sports Medicine

## 2020-02-15 DIAGNOSIS — I10 Essential (primary) hypertension: Secondary | ICD-10-CM | POA: Diagnosis not present

## 2020-02-15 DIAGNOSIS — E119 Type 2 diabetes mellitus without complications: Secondary | ICD-10-CM

## 2020-02-15 DIAGNOSIS — I96 Gangrene, not elsewhere classified: Secondary | ICD-10-CM | POA: Insufficient documentation

## 2020-02-15 DIAGNOSIS — R21 Rash and other nonspecific skin eruption: Secondary | ICD-10-CM

## 2020-02-15 DIAGNOSIS — I739 Peripheral vascular disease, unspecified: Secondary | ICD-10-CM | POA: Diagnosis not present

## 2020-02-15 DIAGNOSIS — T50901D Poisoning by unspecified drugs, medicaments and biological substances, accidental (unintentional), subsequent encounter: Secondary | ICD-10-CM

## 2020-02-15 MED ORDER — DOXYCYCLINE HYCLATE 100 MG PO TABS: 100.0000 mg | ORAL_TABLET | Freq: Two times a day (BID) | ORAL | 0 refills | Status: AC

## 2020-02-15 NOTE — Assessment & Plan Note (Signed)
Continue doing current medications. Seems much better controlled now. Checking some routine labs.

## 2020-02-15 NOTE — Addendum Note (Signed)
Addended by: Monica Becton on: 02/15/2020 02:40 PM   Modules accepted: Orders

## 2020-02-15 NOTE — Assessment & Plan Note (Addendum)
Christopher Robertson returns, he was in the hospital for an accidental drug overdose with altered mental status. Methamphetamine was found in his urine drug screen. His blood pressure has stabilized. He has developed a rash on his legs, see below. I am going to check an updated urine drug screen.  Patient did not provide a urine drug screen.

## 2020-02-15 NOTE — Progress Notes (Addendum)
    Procedures performed today:    None.  Independent interpretation of notes and tests performed by another provider:   None.  Brief History, Exam, Impression, and Recommendations:      Accidental drug overdose Christopher Robertson returns, he was in the hospital for an accidental drug overdose with altered mental status. Methamphetamine was found in his urine drug screen. His blood pressure has stabilized. He has developed a rash on his legs, see below. I am going to check an updated urine drug screen.  Patient did not provide a urine drug screen.  Hypertension Continue doing current medications. Seems much better controlled now. Checking some routine labs.  Skin rash On appearing skin rash in this 69 year old male. We are going to be adding some antibiotics, doxycycline. I think this should get better with avoidance of drugs and some topical antibiotic cream as well. Because he does have some areas of skin necrosis on his toes I am also going to add an ABI to evaluate for PAD. Narcotics can occasionally be placed with levamisole so we will also check vasculitic markers.  Controlled type 2 diabetes mellitus without complication, without long-term current use of insulin (HCC) A1c is now elevated at 7.5, this is consistent with diabetes mellitus, I will see him back in a couple of weeks to discuss additional glycemic control and screening measures.  Peripheral arterial disease (HCC) ABIs do show evidence of significant peripheral arterial disease, I would like him to touch base with vascular surgery.    ___________________________________________ Christopher Robertson. Christopher Robertson, M.D., ABFM., CAQSM. Primary Care and Sports Medicine [place] [facility]  Adjunct Instructor of Family Medicine  [employer]

## 2020-02-15 NOTE — Assessment & Plan Note (Addendum)
On appearing skin rash in this 69 year old male. We are going to be adding some antibiotics, doxycycline. I think this should get better with avoidance of drugs and some topical antibiotic cream as well. Because he does have some areas of skin necrosis on his toes I am also going to add an ABI to evaluate for PAD. Narcotics can occasionally be placed with levamisole so we will also check vasculitic markers.

## 2020-02-16 DIAGNOSIS — E118 Type 2 diabetes mellitus with unspecified complications: Secondary | ICD-10-CM | POA: Insufficient documentation

## 2020-02-16 DIAGNOSIS — E1165 Type 2 diabetes mellitus with hyperglycemia: Secondary | ICD-10-CM | POA: Insufficient documentation

## 2020-02-16 NOTE — Assessment & Plan Note (Signed)
A1c is now elevated at 7.5, this is consistent with diabetes mellitus, I will see him back in a couple of weeks to discuss additional glycemic control and screening measures.

## 2020-02-18 LAB — HEMOGLOBIN A1C
Hgb A1c MFr Bld: 7.5 % of total Hgb — ABNORMAL HIGH (ref <5.7)
Mean Plasma Glucose: 169 (calc)
eAG (mmol/L): 9.3 (calc)

## 2020-02-18 LAB — COMPLETE METABOLIC PANEL WITH GFR
AG Ratio: 1.3 (calc) (ref 1.0–2.5)
ALT: 15 U/L (ref 9–46)
AST: 11 U/L (ref 10–35)
Albumin: 3.7 g/dL (ref 3.6–5.1)
Alkaline phosphatase (APISO): 109 U/L (ref 35–144)
BUN/Creatinine Ratio: 24 (calc) — ABNORMAL HIGH (ref 6–22)
BUN: 29 mg/dL — ABNORMAL HIGH (ref 7–25)
CO2: 28 mmol/L (ref 20–32)
Calcium: 9.7 mg/dL (ref 8.6–10.3)
Chloride: 103 mmol/L (ref 98–110)
Creat: 1.23 mg/dL (ref 0.70–1.25)
GFR, Est African American: 70 mL/min/{1.73_m2} (ref >=60)
GFR, Est Non African American: 61 mL/min/{1.73_m2} (ref >=60)
Globulin: 2.9 g/dL (calc) (ref 1.9–3.7)
Glucose, Bld: 131 mg/dL (ref 65–139)
Potassium: 4.5 mmol/L (ref 3.5–5.3)
Sodium: 139 mmol/L (ref 135–146)
Total Bilirubin: 0.4 mg/dL (ref 0.2–1.2)
Total Protein: 6.6 g/dL (ref 6.1–8.1)

## 2020-02-18 LAB — CBC
HCT: 47.2 % (ref 38.5–50.0)
Hemoglobin: 15.6 g/dL (ref 13.2–17.1)
MCH: 29 pg (ref 27.0–33.0)
MCHC: 33.1 g/dL (ref 32.0–36.0)
MCV: 87.7 fL (ref 80.0–100.0)
MPV: 9.9 fL (ref 7.5–12.5)
Platelets: 329 10*3/uL (ref 140–400)
RBC: 5.38 10*6/uL (ref 4.20–5.80)
RDW: 13.6 % (ref 11.0–15.0)
WBC: 9.6 10*3/uL (ref 3.8–10.8)

## 2020-02-18 LAB — LIPID PANEL W/REFLEX DIRECT LDL
Cholesterol: 155 mg/dL (ref <200)
HDL: 35 mg/dL — ABNORMAL LOW (ref >=40)
LDL Cholesterol (Calc): 84 mg/dL (calc)
Non-HDL Cholesterol (Calc): 120 mg/dL (calc) (ref <130)
Total CHOL/HDL Ratio: 4.4 (calc) (ref <5.0)
Triglycerides: 299 mg/dL — ABNORMAL HIGH (ref <150)

## 2020-02-18 LAB — TSH: TSH: 2.11 mIU/L (ref 0.40–4.50)

## 2020-02-18 LAB — ANCA SCREEN W REFLEX TITER: ANCA Screen: NEGATIVE

## 2020-02-20 ENCOUNTER — Telehealth: Payer: Self-pay

## 2020-02-20 DIAGNOSIS — R21 Rash and other nonspecific skin eruption: Secondary | ICD-10-CM

## 2020-02-20 LAB — DRUG SCREEN, COMPREHENSIVE, WITH CONFIRMATION, URINE

## 2020-02-20 MED ORDER — GABAPENTIN 300 MG PO CAPS: ORAL_CAPSULE | ORAL | 3 refills | Status: DC

## 2020-02-20 NOTE — Telephone Encounter (Signed)
Yes, I am going to add gabapentin, I am also going to set up home health nursing for wound care and assessment. 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  We will also be prepared to apply an Unna boot if he does not have improvement at the follow-up visit.

## 2020-02-20 NOTE — Telephone Encounter (Signed)
Patient called stating that the rash on his feet is causing excruciating pain.  Advil helped but no longer has effect.  "Can he prescribe something to help with the pain? I'm just sitting here in screaming pain."

## 2020-02-21 NOTE — Telephone Encounter (Signed)
Attempted to contact the patient. Busy signal.   Will try again.

## 2020-02-22 NOTE — Telephone Encounter (Signed)
LVM for patient callback for recommendations on home health nursing and adding gabapentin.   Also need to evaluate if patient is picking at lesions.

## 2020-02-26 ENCOUNTER — Ambulatory Visit (HOSPITAL_COMMUNITY)
Admission: RE | Admit: 2020-02-26 | Discharge: 2020-02-26 | Disposition: A | Discharge status: Home / Self Care | Payer: Medicare Other | Source: Ambulatory Visit | Attending: Sports Medicine | Admitting: Sports Medicine

## 2020-02-26 ENCOUNTER — Other Ambulatory Visit: Payer: Self-pay

## 2020-02-26 DIAGNOSIS — R21 Rash and other nonspecific skin eruption: Secondary | ICD-10-CM

## 2020-02-26 DIAGNOSIS — I739 Peripheral vascular disease, unspecified: Secondary | ICD-10-CM | POA: Insufficient documentation

## 2020-02-26 NOTE — Assessment & Plan Note (Signed)
ABIs do show evidence of significant peripheral arterial disease, I would like him to touch base with vascular surgery.

## 2020-02-26 NOTE — Progress Notes (Signed)
ABI completed. Refer to "CV Proc" under chart review to view preliminary results.  02/26/2020 9:50 AM Eula Fried., MHA, RVT, RDCS, RDMS

## 2020-02-26 NOTE — Addendum Note (Signed)
Addended by: Monica Becton on: 02/26/2020 10:21 AM   Modules accepted: Orders

## 2020-02-29 ENCOUNTER — Telehealth: Payer: Self-pay | Admitting: Sports Medicine

## 2020-02-29 ENCOUNTER — Ambulatory Visit: Payer: Medicare Other | Admitting: Sports Medicine

## 2020-02-29 NOTE — Telephone Encounter (Signed)
Pt called at 2:39 to let us know that Medicaid trans did not come to get him. He rescheduled his appt.  Thanks.

## 2020-03-05 ENCOUNTER — Encounter: Payer: Self-pay | Admitting: Sports Medicine

## 2020-03-05 ENCOUNTER — Ambulatory Visit (INDEPENDENT_AMBULATORY_CARE_PROVIDER_SITE_OTHER): Payer: Medicare Other | Admitting: Sports Medicine

## 2020-03-05 VITALS — BP 120/69 | HR 89 | Ht 72.0 in | Wt 246.0 lb

## 2020-03-05 DIAGNOSIS — F1510 Other stimulant abuse, uncomplicated: Secondary | ICD-10-CM | POA: Diagnosis not present

## 2020-03-05 DIAGNOSIS — I739 Peripheral vascular disease, unspecified: Secondary | ICD-10-CM

## 2020-03-05 DIAGNOSIS — R6889 Other general symptoms and signs: Secondary | ICD-10-CM | POA: Diagnosis not present

## 2020-03-05 DIAGNOSIS — R21 Rash and other nonspecific skin eruption: Secondary | ICD-10-CM | POA: Diagnosis not present

## 2020-03-05 DIAGNOSIS — Z79899 Other long term (current) drug therapy: Secondary | ICD-10-CM | POA: Diagnosis not present

## 2020-03-05 MED ORDER — DOXYCYCLINE HYCLATE 100 MG PO TABS: 100.0000 mg | ORAL_TABLET | Freq: Two times a day (BID) | ORAL | 0 refills | Status: AC

## 2020-03-05 MED ORDER — GABAPENTIN 300 MG PO CAPS: ORAL_CAPSULE | ORAL | 3 refills | Status: DC

## 2020-03-05 MED ORDER — AMLODIPINE BESYLATE 10 MG PO TABS: 10.0000 mg | ORAL_TABLET | Freq: Every day | ORAL | 3 refills | Status: DC

## 2020-03-05 MED ORDER — CILOSTAZOL 50 MG PO TABS: 50.0000 mg | ORAL_TABLET | Freq: Two times a day (BID) | ORAL | 3 refills | Status: DC

## 2020-03-05 NOTE — Assessment & Plan Note (Signed)
Christos continues to have the rash, see previous note for picture, it does appear somewhat worse with areas of skin breakdown and necrosis. ABIs were abnormal, we are going to add Unna boots today and get him in weekly for [employer] changes and a nurse visit. I am going to add another course of antibiotics.

## 2020-03-05 NOTE — Addendum Note (Signed)
Addended by: Donne Anon L on: 03/05/2020 10:40 AM   Modules accepted: Orders

## 2020-03-05 NOTE — Progress Notes (Signed)
    Procedures performed today:    Bilateral Unna boot applied.  Independent interpretation of notes and tests performed by another provider:   None.  Brief History, Exam, Impression, and Recommendations:    Skin rash Christopher Robertson continues to have the rash, see previous note for picture, it does appear somewhat worse with areas of skin breakdown and necrosis. ABIs were abnormal, we are going to add Unna boots today and get him in weekly for [employer] changes and a nurse visit. I am going to add another course of antibiotics.   Peripheral arterial disease (HCC) Abnormal ABIs, we did refer him to vascular surgery, adding some Unna boots, adding Pletal, I am going to also order a CT angiogram aortobifemoral with bilateral lower extremity runoff.    ___________________________________________ Ihor Austin. Benjamin Stain, M.D., ABFM., CAQSM. Primary Care and Sports Medicine [place] [facility]  Adjunct Instructor of Family Medicine  [employer]

## 2020-03-05 NOTE — Assessment & Plan Note (Signed)
Abnormal ABIs, we did refer him to vascular surgery, adding some Unna boots, adding Pletal, I am going to also order a CT angiogram aortobifemoral with bilateral lower extremity runoff.

## 2020-03-07 LAB — DRUG SCREEN, COMPREHENSIVE, WITH CONFIRMATION, URINE: Urine Results: DETECTED — AB

## 2020-03-10 ENCOUNTER — Other Ambulatory Visit: Payer: Medicare Other

## 2020-03-12 ENCOUNTER — Ambulatory Visit (INDEPENDENT_AMBULATORY_CARE_PROVIDER_SITE_OTHER): Payer: Medicare Other | Admitting: Sports Medicine

## 2020-03-12 ENCOUNTER — Other Ambulatory Visit: Payer: Self-pay

## 2020-03-12 ENCOUNTER — Encounter: Payer: Self-pay | Admitting: Sports Medicine

## 2020-03-12 ENCOUNTER — Ambulatory Visit: Payer: Medicare Other

## 2020-03-12 ENCOUNTER — Ambulatory Visit: Payer: Medicare Other | Admitting: Sports Medicine

## 2020-03-12 VITALS — BP 114/88 | HR 94

## 2020-03-12 DIAGNOSIS — F1510 Other stimulant abuse, uncomplicated: Secondary | ICD-10-CM

## 2020-03-12 DIAGNOSIS — R21 Rash and other nonspecific skin eruption: Secondary | ICD-10-CM | POA: Diagnosis not present

## 2020-03-12 NOTE — Progress Notes (Signed)
    Procedures performed today:    Old Unna boot removed and reapplied bilaterally.  Independent interpretation of notes and tests performed by another provider:   None.  Brief History, Exam, Impression, and Recommendations:    Skin rash Caylen returns, he continues to have a rash albeit slightly better than before, continue Unna boots, I did explain to him that this would likely take for to 6 weeks to fully heal. He needs to lay off of the meth. Nurse visit in 1 week for repeat Unna boot.  Methamphetamine abuse (HCC) At the last visit we noted methamphetamines on a recent drug screen, ordering another drug screen. Fritz admits to illicit fentanyl use but not methamphetamine use.    ___________________________________________ Ihor Austin. Benjamin Stain, M.D., ABFM., CAQSM. Primary Care and Sports Medicine [place] [facility]  Adjunct Instructor of Family Medicine  [employer]

## 2020-03-12 NOTE — Assessment & Plan Note (Signed)
At the last visit we noted methamphetamines on a recent drug screen, ordering another drug screen. Belinda admits to illicit fentanyl use but not methamphetamine use.

## 2020-03-12 NOTE — Assessment & Plan Note (Signed)
Aston returns, he continues to have a rash albeit slightly better than before, continue Unna boots, I did explain to him that this would likely take for to 6 weeks to fully heal. He needs to lay off of the meth. Nurse visit in 1 week for repeat Unna boot.

## 2020-03-20 ENCOUNTER — Other Ambulatory Visit: Payer: Self-pay

## 2020-03-20 ENCOUNTER — Ambulatory Visit (INDEPENDENT_AMBULATORY_CARE_PROVIDER_SITE_OTHER): Payer: Medicare Other | Admitting: Family Medicine

## 2020-03-20 ENCOUNTER — Ambulatory Visit: Payer: Medicare Other

## 2020-03-20 ENCOUNTER — Encounter: Payer: Self-pay | Admitting: Family Medicine

## 2020-03-20 VITALS — BP 115/63 | HR 109

## 2020-03-20 DIAGNOSIS — I959 Hypotension, unspecified: Secondary | ICD-10-CM | POA: Diagnosis not present

## 2020-03-20 DIAGNOSIS — I7025 Atherosclerosis of native arteries of other extremities with ulceration: Secondary | ICD-10-CM

## 2020-03-20 DIAGNOSIS — R21 Rash and other nonspecific skin eruption: Secondary | ICD-10-CM

## 2020-03-20 NOTE — Progress Notes (Signed)
Patient comes for Unna boot change due to rash on both legs/feet. Patient removed unna boot from both legs two days ago due to swelling. He states rash is much better. His first blood pressure reading is 68/46. Second reading is 84/49. He does report some dizziness.   He was evaluated by Dr. Linford Arnold. A picture was taken for the chart. Okay given to rewrap unna boots. Unna boots placed on both legs. Patient was given a cup of water and blood pressure increased to 115/63. He was encouraged to drink lots of water. He will make appt to have Unna boots re-evaluated on Tuesday per Dr. Linford Arnold.

## 2020-03-20 NOTE — Progress Notes (Signed)
Acute Office Visit  Subjective:    Patient ID: Christopher Robertson, male    DOB: 02/11/1951, 69 y.o.   MRN: [id number]  Chief Complaint  Patient presents with  . Rash    HPI Patient is in today for follow-up of multiple lower extremity wounds.  He had Unna boots that were placed almost a week ago but took them off about 2 days ago because he said that the bandages were starting to roll and he was started to get a lot of swelling above the bandage.  He says he did take a bath this morning but did put some topical calamine lotion on..  He says his pain has been improving. He has had much less pain.    Past Medical History:  Diagnosis Date  . COPD (chronic obstructive pulmonary disease) (HCC)   . History of concussion    yrs ago-- no residual  . Hypertension   . OA (osteoarthritis)   . Right hydrocele     Past Surgical History:  Procedure Laterality Date  . FEMUR FRACTURE SURGERY  1985  . HYDROCELE EXCISION Right 05/23/2015   Procedure: RIGHT HYDROCELECTOMY ADULT;  Surgeon: Ihor Gully, MD;  Location: [facility];  Service: Urology;  Laterality: Right;  . KNEE SURGERY Bilateral x5 last one 2003  . TOTAL HIP ARTHROPLASTY Right 02-28-2015    Family History  Problem Relation Age of Onset  . Dementia Mother   . Colon cancer Father   . Cancer Paternal Grandmother     Social History   Socioeconomic History  . Marital status: Married    Spouse name: Not on file  . Number of children: Not on file  . Years of education: Not on file  . Highest education level: Not on file  Occupational History  . Not on file  Tobacco Use  . Smoking status: Current Every Day Smoker    Packs/day: 0.50    Years: 40.00    Pack years: 20.00    Types: Cigarettes  . Smokeless tobacco: Former Neurosurgeon    Types: Chew  Substance and Sexual Activity  . Alcohol use: No  . Drug use: No  . Sexual activity: Not on file  Other Topics Concern  . Not on file  Social History Narrative   . Not on file   Social Determinants of Health   Financial Resource Strain:   . Difficulty of Paying Living Expenses:   Food Insecurity:   . Worried About Programme researcher, broadcasting/film/video in the Last Year:   . Barista in the Last Year:   Transportation Needs:   . Freight forwarder (Medical):   Marland Kitchen Lack of Transportation (Non-Medical):   Physical Activity:   . Days of Exercise per Week:   . Minutes of Exercise per Session:   Stress:   . Feeling of Stress :   Social Connections:   . Frequency of Communication with Friends and Family:   . Frequency of Social Gatherings with Friends and Family:   . Attends Religious Services:   . Active Member of Clubs or Organizations:   . Attends Banker Meetings:   Marland Kitchen Marital Status:   Intimate Partner Violence:   . Fear of Current or Ex-Partner:   . Emotionally Abused:   Marland Kitchen Physically Abused:   . Sexually Abused:     Outpatient Medications Prior to Visit  Medication Sig Dispense Refill  . amLODipine (NORVASC) 10 MG tablet Take 1 tablet (10 mg total)  by mouth daily. 90 tablet 3  . aspirin buffered (BUFFERIN) 325 MG TABS tablet Take by mouth.    Marland Kitchen atorvastatin (LIPITOR) 80 MG tablet Take 1 tablet (80 mg total) by mouth daily at 6 PM. 90 tablet 3  . carvedilol (COREG) 6.25 MG tablet Take by mouth 2 (two) times daily with a meal.     . cilostazol (PLETAL) 50 MG tablet Take 1 tablet (50 mg total) by mouth 2 (two) times daily. 60 tablet 3  . gabapentin (NEURONTIN) 300 MG capsule One tab PO qHS for a week, then BID for a week, then TID. May double weekly to a max of 3,600mg /day (Patient taking differently: Take 300 mg by mouth 3 (three) times daily. One tab PO qHS for a week, then BID for a week, then TID. May double weekly to a max of 3,600mg /day) 30 capsule 3  . hydrOXYzine (ATARAX/VISTARIL) 50 MG tablet Take 1 tablet (50 mg total) by mouth at bedtime. 90 tablet 3  . ipratropium-albuterol (DUONEB) 0.5-2.5 (3) MG/3ML SOLN INHALE 1 VIAL VIA  NEBULIZATION BY MOUTH EVERY 6 HOURS AS NEEDED 360 mL 3  . lisinopril (ZESTRIL) 40 MG tablet Take 1 tablet (40 mg total) by mouth daily. 90 tablet 3  . PARoxetine (PAXIL) 40 MG tablet Take 1 tablet (40 mg total) by mouth daily. 90 tablet 3   No facility-administered medications prior to visit.    No Known Allergies  Review of Systems     Objective:    Physical Exam Skin:    Comments: Multiple scabs and open wounds on his lower extremities if you have a little bit of a yellow serous drainage.  They are coated with calamine lotion.           BP (!) 115/63   Pulse (!) 109   SpO2 93%  Wt Readings from Last 3 Encounters:  03/05/20 246 lb (111.6 kg)  01/14/20 260 lb (117.9 kg)  11/20/19 260 lb (117.9 kg)    Health Maintenance Due  Topic Date Due  . OPHTHALMOLOGY EXAM  Never done  . COVID-19 Vaccine (1) Never done  . Fecal DNA (Cologuard)  Never done    There are no preventive care reminders to display for this patient.   Lab Results  Component Value Date   TSH 2.11 02/15/2020   Lab Results  Component Value Date   WBC 9.6 02/15/2020   HGB 15.6 02/15/2020   HCT 47.2 02/15/2020   MCV 87.7 02/15/2020   PLT 329 02/15/2020   Lab Results  Component Value Date   NA 139 02/15/2020   K 4.5 02/15/2020   CO2 28 02/15/2020   GLUCOSE 131 02/15/2020   BUN 29 (H) 02/15/2020   CREATININE 1.23 02/15/2020   BILITOT 0.4 02/15/2020   ALKPHOS 63 03/10/2015   AST 11 02/15/2020   ALT 15 02/15/2020   PROT 6.6 02/15/2020   ALBUMIN 3.4 (L) 03/10/2015   CALCIUM 9.7 02/15/2020   ANIONGAP 10 02/10/2018   Lab Results  Component Value Date   CHOL 155 02/15/2020   Lab Results  Component Value Date   HDL 35 (L) 02/15/2020   Lab Results  Component Value Date   LDLCALC 84 02/15/2020   Lab Results  Component Value Date   TRIG 299 (H) 02/15/2020   Lab Results  Component Value Date   CHOLHDL 4.4 02/15/2020   Lab Results  Component Value Date   HGBA1C 7.5 (H)  02/15/2020       Assessment &  Plan:   Problem List Items Addressed This Visit      Musculoskeletal and Integument   Skin rash - Primary   Relevant Orders   AMB referral to wound care center    Other Visit Diagnoses    Hypotension, unspecified hypotension type       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  He said he just took his medication.  He says he thinks he spilled some of the pills into the other pills and got them mixed up so it is possible he is actually taken too much normally his blood pressure looks pretty good.  We gave him some water and had him drink some fluid and then rechecked his pressure before he left and it did come up.  Multiple ulcerations on the lower legs.  It looks like there is possibly some necrotic tissue present that will likely need debridement.  I was able to find a photograph and a previous visit from about 5 weeks ago they look significantly worse.  I did go ahead and refer him to wound management we did go ahead and apply Unna boots again today for compression.   No orders of the defined types were placed in this encounter.    Nani Gasser, MD

## 2020-03-25 ENCOUNTER — Encounter: Payer: Self-pay | Admitting: Sports Medicine

## 2020-03-25 ENCOUNTER — Ambulatory Visit (INDEPENDENT_AMBULATORY_CARE_PROVIDER_SITE_OTHER): Payer: Medicare Other | Admitting: Sports Medicine

## 2020-03-25 ENCOUNTER — Other Ambulatory Visit: Payer: Self-pay

## 2020-03-25 DIAGNOSIS — I96 Gangrene, not elsewhere classified: Secondary | ICD-10-CM

## 2020-03-25 DIAGNOSIS — F17200 Nicotine dependence, unspecified, uncomplicated: Secondary | ICD-10-CM

## 2020-03-25 DIAGNOSIS — R6889 Other general symptoms and signs: Secondary | ICD-10-CM | POA: Diagnosis not present

## 2020-03-25 MED ORDER — BUPROPION HCL ER (SMOKING DET) 150 MG PO TB12: ORAL_TABLET | ORAL | 3 refills | Status: DC

## 2020-03-25 NOTE — Assessment & Plan Note (Signed)
Adding Wellbutrin in smoking cessation dosing.

## 2020-03-25 NOTE — Progress Notes (Signed)
    Procedures performed today:    None.  Independent interpretation of notes and tests performed by another provider:   None.  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 We also obtained an ABI that showed evidence of abnormal arterial waveforms suggestive of PAD but no true ABI was obtained due to lack of ability to inflate the blood pressure cuff on his legs. He has had courses of antibiotics. He continues to smoke 6 to 7 cigarettes/day. He denies further methamphetamine use but this was noted positive on a recent urine drug screen. I have referred him to vascular surgery but he has not followed through with this, he did get the phone call. We are also referring him to the wound care center, I will go ahead and have our nurses replace his Unna boot today. Unless advised differently from the wound care center we will continue his Unna boots.  Smoker Adding Wellbutrin in smoking cessation dosing.    ___________________________________________ Christopher Robertson. Benjamin Stain, M.D., ABFM., CAQSM. Primary Care and Sports Medicine [place] [facility]  Adjunct Instructor of Family Medicine  [employer]

## 2020-03-25 NOTE — Assessment & Plan Note (Signed)
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 We also obtained an ABI that showed evidence of abnormal arterial waveforms suggestive of PAD but no true ABI was obtained due to lack of ability to inflate the blood pressure cuff on his legs. He has had courses of antibiotics. He continues to smoke 6 to 7 cigarettes/day. He denies further methamphetamine use but this was noted positive on a recent urine drug screen. I have referred him to vascular surgery but he has not followed through with this, he did get the phone call. We are also referring him to the wound care center, I will go ahead and have our nurses replace his Unna boot today. Unless advised differently from the wound care center we will continue his Unna boots.

## 2020-04-01 ENCOUNTER — Other Ambulatory Visit: Payer: Self-pay

## 2020-04-01 ENCOUNTER — Encounter: Payer: Self-pay | Admitting: Sports Medicine

## 2020-04-01 ENCOUNTER — Ambulatory Visit (INDEPENDENT_AMBULATORY_CARE_PROVIDER_SITE_OTHER): Payer: Medicare Other | Admitting: Sports Medicine

## 2020-04-01 DIAGNOSIS — I96 Gangrene, not elsewhere classified: Secondary | ICD-10-CM

## 2020-04-01 NOTE — Assessment & Plan Note (Addendum)
Christopher Robertson returns, we are on his fifth [employer], he did have several areas of skin breakdown and necrosis, initially black discharge, but subsequently open ulcers down to the subcutaneous tissues with what appears to be some granulation tissue underneath. They do look a little bit better than prior, we have referred him to wound care several times, he endorses he has not been called but I suspect he has just not completed the referral process, same with vascular surgery, he has not followed through with the referral, he did endorse that he got the phone call but never called them back. Of note he did have ABIs ordered, ultrasonography showed abnormal waveforms suggestive of PAD but no true ABI was performed due to open draining ulcers on the legs. Down to 4 cigarettes/day.

## 2020-04-01 NOTE — Progress Notes (Signed)
    Procedures performed today:    None.  Independent interpretation of notes and tests performed by another provider:   None.  Brief History, Exam, Impression, and Recommendations:    Skin necrosis (HCC) Rocket returns, we are on his fifth [employer], he did have several areas of skin breakdown and necrosis, initially black discharge, but subsequently open ulcers down to the subcutaneous tissues with what appears to be some granulation tissue underneath. They do look a little bit better than prior, we have referred him to wound care several times, he endorses he has not been called but I suspect he has just not completed the referral process, same with vascular surgery, he has not followed through with the referral, he did endorse that he got the phone call but never called them back. Of note he did have ABIs ordered, ultrasonography showed abnormal waveforms suggestive of PAD but no true ABI was performed due to open draining ulcers on the legs. Down to 4 cigarettes/day.    ___________________________________________ Ihor Austin. Benjamin Stain, M.D., ABFM., CAQSM. Primary Care and Sports Medicine [place] [facility]  Adjunct Instructor of Family Medicine  [employer]

## 2020-04-07 ENCOUNTER — Other Ambulatory Visit: Payer: Self-pay | Admitting: Sports Medicine

## 2020-04-07 DIAGNOSIS — R21 Rash and other nonspecific skin eruption: Secondary | ICD-10-CM

## 2020-04-08 ENCOUNTER — Ambulatory Visit: Payer: Medicare Other

## 2020-04-09 ENCOUNTER — Ambulatory Visit: Payer: Medicare Other

## 2020-04-17 DIAGNOSIS — J449 Chronic obstructive pulmonary disease, unspecified: Secondary | ICD-10-CM | POA: Diagnosis not present

## 2020-04-17 DIAGNOSIS — Z79899 Other long term (current) drug therapy: Secondary | ICD-10-CM | POA: Diagnosis not present

## 2020-04-17 DIAGNOSIS — Z743 Need for continuous supervision: Secondary | ICD-10-CM | POA: Diagnosis not present

## 2020-04-17 DIAGNOSIS — L97522 Non-pressure chronic ulcer of other part of left foot with fat layer exposed: Secondary | ICD-10-CM | POA: Diagnosis not present

## 2020-04-17 DIAGNOSIS — R6889 Other general symptoms and signs: Secondary | ICD-10-CM | POA: Diagnosis not present

## 2020-04-17 DIAGNOSIS — R079 Chest pain, unspecified: Secondary | ICD-10-CM | POA: Diagnosis not present

## 2020-04-17 DIAGNOSIS — R61 Generalized hyperhidrosis: Secondary | ICD-10-CM | POA: Diagnosis not present

## 2020-04-17 DIAGNOSIS — R0789 Other chest pain: Secondary | ICD-10-CM | POA: Diagnosis not present

## 2020-04-17 DIAGNOSIS — R000 Tachycardia, unspecified: Secondary | ICD-10-CM | POA: Diagnosis not present

## 2020-04-17 DIAGNOSIS — F17200 Nicotine dependence, unspecified, uncomplicated: Secondary | ICD-10-CM | POA: Diagnosis not present

## 2020-04-17 DIAGNOSIS — Z951 Presence of aortocoronary bypass graft: Secondary | ICD-10-CM | POA: Diagnosis not present

## 2020-04-17 DIAGNOSIS — R072 Precordial pain: Secondary | ICD-10-CM | POA: Diagnosis not present

## 2020-04-17 DIAGNOSIS — I872 Venous insufficiency (chronic) (peripheral): Secondary | ICD-10-CM | POA: Diagnosis not present

## 2020-04-17 DIAGNOSIS — R7989 Other specified abnormal findings of blood chemistry: Secondary | ICD-10-CM | POA: Diagnosis not present

## 2020-04-17 DIAGNOSIS — R9431 Abnormal electrocardiogram [ECG] [EKG]: Secondary | ICD-10-CM | POA: Diagnosis not present

## 2020-04-17 DIAGNOSIS — Z20822 Contact with and (suspected) exposure to covid-19: Secondary | ICD-10-CM | POA: Diagnosis not present

## 2020-04-17 DIAGNOSIS — L97319 Non-pressure chronic ulcer of right ankle with unspecified severity: Secondary | ICD-10-CM | POA: Diagnosis not present

## 2020-04-17 DIAGNOSIS — L97512 Non-pressure chronic ulcer of other part of right foot with fat layer exposed: Secondary | ICD-10-CM | POA: Diagnosis not present

## 2020-04-17 DIAGNOSIS — L97513 Non-pressure chronic ulcer of other part of right foot with necrosis of muscle: Secondary | ICD-10-CM | POA: Diagnosis not present

## 2020-04-17 DIAGNOSIS — I2510 Atherosclerotic heart disease of native coronary artery without angina pectoris: Secondary | ICD-10-CM | POA: Diagnosis not present

## 2020-04-17 DIAGNOSIS — F17220 Nicotine dependence, chewing tobacco, uncomplicated: Secondary | ICD-10-CM | POA: Diagnosis not present

## 2020-04-17 DIAGNOSIS — I10 Essential (primary) hypertension: Secondary | ICD-10-CM | POA: Diagnosis not present

## 2020-04-17 DIAGNOSIS — L97912 Non-pressure chronic ulcer of unspecified part of right lower leg with fat layer exposed: Secondary | ICD-10-CM | POA: Diagnosis not present

## 2020-04-18 ENCOUNTER — Encounter: Payer: Medicare Other | Admitting: Vascular Surgery

## 2020-05-02 ENCOUNTER — Ambulatory Visit (INDEPENDENT_AMBULATORY_CARE_PROVIDER_SITE_OTHER): Payer: Medicare Other | Admitting: Vascular Surgery

## 2020-05-02 ENCOUNTER — Encounter: Payer: Self-pay | Admitting: Vascular Surgery

## 2020-05-02 ENCOUNTER — Other Ambulatory Visit: Payer: Self-pay

## 2020-05-02 VITALS — BP 98/65 | HR 105 | Temp 97.6°F | Resp 20 | Ht 72.0 in | Wt 242.0 lb

## 2020-05-02 DIAGNOSIS — R6889 Other general symptoms and signs: Secondary | ICD-10-CM | POA: Diagnosis not present

## 2020-05-02 DIAGNOSIS — I739 Peripheral vascular disease, unspecified: Secondary | ICD-10-CM

## 2020-05-02 NOTE — Progress Notes (Signed)
Patient ID: Christopher Robertson, male   DOB: [birth year], 69 y.o.   MRN: [id number]  Reason for Consult: New Patient (Initial Visit)   Referred by Monica Becton,*  Subjective:     HPI:  Christopher Robertson is a 69 y.o. male long history of smoking also has COPD and hypertension.  Denies any history of vascular disease although does have coronary artery disease status post bypass.  Few months ago he developed blisters in the middle the night has subsequently had wounds has been followed by the wound care center at [facility] tinges to walk with the help of a walker.  He has never had vascular intervention.  Past Medical History:  Diagnosis Date  . COPD (chronic obstructive pulmonary disease) (HCC)   . History of concussion    yrs ago-- no residual  . Hypertension   . OA (osteoarthritis)   . Right hydrocele    Family History  Problem Relation Age of Onset  . Dementia Mother   . Colon cancer Father   . Cancer Paternal Grandmother    Past Surgical History:  Procedure Laterality Date  . FEMUR FRACTURE SURGERY  1985  . HYDROCELE EXCISION Right 05/23/2015   Procedure: RIGHT HYDROCELECTOMY ADULT;  Surgeon: Ihor Gully, MD;  Location: [facility];  Service: Urology;  Laterality: Right;  . KNEE SURGERY Bilateral x5 last one 2003  . TOTAL HIP ARTHROPLASTY Right 02-28-2015    Short Social History:  Social History   Tobacco Use  . Smoking status: Current Every Day Smoker    Packs/day: 0.50    Years: 40.00    Pack years: 20.00    Types: Cigarettes  . Smokeless tobacco: Former Neurosurgeon    Types: Chew  Substance Use Topics  . Alcohol use: No    No Known Allergies  Current Outpatient Medications  Medication Sig Dispense Refill  . amLODipine (NORVASC) 10 MG tablet Take 1 tablet (10 mg total) by mouth daily. 90 tablet 3  . aspirin buffered (BUFFERIN) 325 MG TABS tablet Take by mouth.    Marland Kitchen atorvastatin (LIPITOR) 80 MG tablet Take 1 tablet (80 mg total) by  mouth daily at 6 PM. 90 tablet 3  . carvedilol (COREG) 6.25 MG tablet Take by mouth 2 (two) times daily with a meal.     . cilostazol (PLETAL) 50 MG tablet Take 1 tablet (50 mg total) by mouth 2 (two) times daily. 60 tablet 3  . gabapentin (NEURONTIN) 300 MG capsule Take 1 capsule (300 mg total) by mouth 3 (three) times daily. 90 capsule 3  . hydrOXYzine (ATARAX/VISTARIL) 50 MG tablet Take 1 tablet (50 mg total) by mouth at bedtime. 90 tablet 3  . ipratropium-albuterol (DUONEB) 0.5-2.5 (3) MG/3ML SOLN INHALE 1 VIAL VIA NEBULIZATION BY MOUTH EVERY 6 HOURS AS NEEDED 360 mL 3  . lisinopril (ZESTRIL) 40 MG tablet Take 1 tablet (40 mg total) by mouth daily. 90 tablet 3  . PARoxetine (PAXIL) 10 MG tablet TAKE ONE TABLET BY MOUTH DAILY 90 tablet 3  . PARoxetine (PAXIL) 40 MG tablet Take 1 tablet (40 mg total) by mouth daily. 90 tablet 3  . buPROPion (ZYBAN) 150 MG 12 hr tablet 1 tab PO qd x3d, then BID, d/c smoking after the first 7d. (Patient not taking: Reported on 04/01/2020) 60 tablet 3   No current facility-administered medications for this visit.    Review of Systems  Constitutional:  Constitutional negative. HENT: HENT negative.  Eyes: Eyes negative.  Respiratory: Positive for shortness  of breath.  Cardiovascular: Cardiovascular negative.  GI: Gastrointestinal negative.  Musculoskeletal: Positive for leg pain.  Skin: Positive for wound.  Neurological: Neurological negative. Hematologic: Hematologic/lymphatic negative.  Psychiatric: Psychiatric negative.        Objective:  Objective   Vitals:   05/02/20 1101  BP: 98/65  Pulse: (!) 105  Resp: 20  Temp: 97.6 F (36.4 C)  SpO2: 95%  Weight: 242 lb (109.8 kg)  Height: 6' (1.829 m)   Body mass index is 32.82 kg/m.  Physical Exam HENT:     Head: Normocephalic.     Nose:     Comments: Wearing a mask Eyes:     Pupils: Pupils are equal, round, and reactive to light.  Cardiovascular:     Pulses:          Radial pulses are 0  on the right side and 2+ on the left side.       Femoral pulses are 0 on the right side and 0 on the left side. Pulmonary:     Breath sounds: Wheezing present.  Abdominal:     General: Abdomen is flat.     Palpations: Abdomen is soft. There is no mass.  Musculoskeletal:        General: Normal range of motion.  Skin:    Capillary Refill: Capillary refill takes more than 3 seconds.     Findings: Lesion present.  Neurological:     General: No focal deficit present.     Mental Status: He is alert.  Psychiatric:        Mood and Affect: Mood normal.        Behavior: Behavior normal.        Thought Content: Thought content normal.        Judgment: Judgment normal.     Data: I reviewed his ABIs which were monophasic and ABI numbers could not be determined given wounds on his legs     Assessment/Plan:     68yo WM with multiple bilateral lower extremity wounds.  Denies ever having vascular procedures.  ABIs were monophasic and undetermined due to wounds on his bilateral legs.  Does not have palpable common femoral pulses.  We will begin with CT angio bilateral lower extremity runoff to evaluate the extent of his disease.  I am unsure he would be a candidate for any large vascular procedures given underlying COPD and coronary artery disease.  We will get the CT scan have him follow-up or call to discuss.     Maeola Harman MD [facility]

## 2020-05-04 DIAGNOSIS — R918 Other nonspecific abnormal finding of lung field: Secondary | ICD-10-CM | POA: Diagnosis not present

## 2020-05-04 DIAGNOSIS — R6889 Other general symptoms and signs: Secondary | ICD-10-CM | POA: Diagnosis not present

## 2020-05-04 DIAGNOSIS — Z951 Presence of aortocoronary bypass graft: Secondary | ICD-10-CM | POA: Diagnosis not present

## 2020-05-04 DIAGNOSIS — J9811 Atelectasis: Secondary | ICD-10-CM | POA: Diagnosis not present

## 2020-05-04 DIAGNOSIS — F17220 Nicotine dependence, chewing tobacco, uncomplicated: Secondary | ICD-10-CM | POA: Diagnosis not present

## 2020-05-04 DIAGNOSIS — I2510 Atherosclerotic heart disease of native coronary artery without angina pectoris: Secondary | ICD-10-CM | POA: Diagnosis not present

## 2020-05-04 DIAGNOSIS — R0602 Shortness of breath: Secondary | ICD-10-CM | POA: Diagnosis not present

## 2020-05-04 DIAGNOSIS — R069 Unspecified abnormalities of breathing: Secondary | ICD-10-CM | POA: Diagnosis not present

## 2020-05-04 DIAGNOSIS — R7989 Other specified abnormal findings of blood chemistry: Secondary | ICD-10-CM | POA: Diagnosis not present

## 2020-05-04 DIAGNOSIS — Z79899 Other long term (current) drug therapy: Secondary | ICD-10-CM | POA: Diagnosis not present

## 2020-05-04 DIAGNOSIS — R9431 Abnormal electrocardiogram [ECG] [EKG]: Secondary | ICD-10-CM | POA: Diagnosis not present

## 2020-05-04 DIAGNOSIS — I10 Essential (primary) hypertension: Secondary | ICD-10-CM | POA: Diagnosis not present

## 2020-05-04 DIAGNOSIS — R0902 Hypoxemia: Secondary | ICD-10-CM | POA: Diagnosis not present

## 2020-05-04 DIAGNOSIS — R000 Tachycardia, unspecified: Secondary | ICD-10-CM | POA: Diagnosis not present

## 2020-05-04 DIAGNOSIS — Z743 Need for continuous supervision: Secondary | ICD-10-CM | POA: Diagnosis not present

## 2020-05-04 DIAGNOSIS — J441 Chronic obstructive pulmonary disease with (acute) exacerbation: Secondary | ICD-10-CM | POA: Diagnosis not present

## 2020-05-04 DIAGNOSIS — F17210 Nicotine dependence, cigarettes, uncomplicated: Secondary | ICD-10-CM | POA: Diagnosis not present

## 2020-05-05 DIAGNOSIS — I2510 Atherosclerotic heart disease of native coronary artery without angina pectoris: Secondary | ICD-10-CM | POA: Diagnosis not present

## 2020-05-05 DIAGNOSIS — R7989 Other specified abnormal findings of blood chemistry: Secondary | ICD-10-CM | POA: Diagnosis not present

## 2020-05-05 DIAGNOSIS — R000 Tachycardia, unspecified: Secondary | ICD-10-CM | POA: Diagnosis not present

## 2020-05-05 DIAGNOSIS — J9811 Atelectasis: Secondary | ICD-10-CM | POA: Diagnosis not present

## 2020-05-06 ENCOUNTER — Other Ambulatory Visit: Payer: Self-pay

## 2020-05-06 DIAGNOSIS — I739 Peripheral vascular disease, unspecified: Secondary | ICD-10-CM

## 2020-05-13 ENCOUNTER — Other Ambulatory Visit: Payer: Self-pay | Admitting: Sports Medicine

## 2020-05-13 DIAGNOSIS — I739 Peripheral vascular disease, unspecified: Secondary | ICD-10-CM

## 2020-05-16 ENCOUNTER — Other Ambulatory Visit: Payer: Self-pay | Admitting: Sports Medicine

## 2020-05-16 DIAGNOSIS — I10 Essential (primary) hypertension: Secondary | ICD-10-CM | POA: Diagnosis not present

## 2020-05-16 DIAGNOSIS — R0789 Other chest pain: Secondary | ICD-10-CM | POA: Diagnosis not present

## 2020-05-16 DIAGNOSIS — Z79899 Other long term (current) drug therapy: Secondary | ICD-10-CM | POA: Diagnosis not present

## 2020-05-16 DIAGNOSIS — R778 Other specified abnormalities of plasma proteins: Secondary | ICD-10-CM | POA: Diagnosis not present

## 2020-05-16 DIAGNOSIS — R7989 Other specified abnormal findings of blood chemistry: Secondary | ICD-10-CM | POA: Diagnosis not present

## 2020-05-16 DIAGNOSIS — Z743 Need for continuous supervision: Secondary | ICD-10-CM | POA: Diagnosis not present

## 2020-05-16 DIAGNOSIS — R0602 Shortness of breath: Secondary | ICD-10-CM | POA: Diagnosis not present

## 2020-05-16 DIAGNOSIS — R062 Wheezing: Secondary | ICD-10-CM | POA: Diagnosis not present

## 2020-05-16 DIAGNOSIS — J449 Chronic obstructive pulmonary disease, unspecified: Secondary | ICD-10-CM | POA: Diagnosis not present

## 2020-05-16 DIAGNOSIS — G8911 Acute pain due to trauma: Secondary | ICD-10-CM | POA: Diagnosis not present

## 2020-05-16 DIAGNOSIS — R6889 Other general symptoms and signs: Secondary | ICD-10-CM | POA: Diagnosis not present

## 2020-05-16 DIAGNOSIS — R079 Chest pain, unspecified: Secondary | ICD-10-CM | POA: Diagnosis not present

## 2020-05-16 DIAGNOSIS — R0902 Hypoxemia: Secondary | ICD-10-CM | POA: Diagnosis not present

## 2020-05-16 DIAGNOSIS — F17200 Nicotine dependence, unspecified, uncomplicated: Secondary | ICD-10-CM | POA: Diagnosis not present

## 2020-05-19 ENCOUNTER — Other Ambulatory Visit: Payer: Medicare Other

## 2020-05-21 ENCOUNTER — Other Ambulatory Visit: Payer: Medicare Other

## 2020-05-23 ENCOUNTER — Ambulatory Visit: Payer: Medicare Other | Admitting: Vascular Surgery

## 2020-07-08 DIAGNOSIS — M2011 Hallux valgus (acquired), right foot: Secondary | ICD-10-CM | POA: Diagnosis not present

## 2020-07-08 DIAGNOSIS — R6889 Other general symptoms and signs: Secondary | ICD-10-CM | POA: Diagnosis not present

## 2020-07-08 DIAGNOSIS — F17210 Nicotine dependence, cigarettes, uncomplicated: Secondary | ICD-10-CM | POA: Diagnosis not present

## 2020-07-08 DIAGNOSIS — J9602 Acute respiratory failure with hypercapnia: Secondary | ICD-10-CM | POA: Diagnosis not present

## 2020-07-08 DIAGNOSIS — I2510 Atherosclerotic heart disease of native coronary artery without angina pectoris: Secondary | ICD-10-CM | POA: Diagnosis not present

## 2020-07-08 DIAGNOSIS — R0603 Acute respiratory distress: Secondary | ICD-10-CM | POA: Diagnosis not present

## 2020-07-08 DIAGNOSIS — Z743 Need for continuous supervision: Secondary | ICD-10-CM | POA: Diagnosis not present

## 2020-07-08 DIAGNOSIS — I10 Essential (primary) hypertension: Secondary | ICD-10-CM | POA: Diagnosis not present

## 2020-07-08 DIAGNOSIS — R531 Weakness: Secondary | ICD-10-CM | POA: Diagnosis not present

## 2020-07-08 DIAGNOSIS — J984 Other disorders of lung: Secondary | ICD-10-CM | POA: Diagnosis not present

## 2020-07-08 DIAGNOSIS — L97912 Non-pressure chronic ulcer of unspecified part of right lower leg with fat layer exposed: Secondary | ICD-10-CM | POA: Diagnosis not present

## 2020-07-08 DIAGNOSIS — E1151 Type 2 diabetes mellitus with diabetic peripheral angiopathy without gangrene: Secondary | ICD-10-CM | POA: Diagnosis not present

## 2020-07-08 DIAGNOSIS — L97922 Non-pressure chronic ulcer of unspecified part of left lower leg with fat layer exposed: Secondary | ICD-10-CM | POA: Diagnosis not present

## 2020-07-08 DIAGNOSIS — G9341 Metabolic encephalopathy: Secondary | ICD-10-CM | POA: Diagnosis not present

## 2020-07-08 DIAGNOSIS — Z7951 Long term (current) use of inhaled steroids: Secondary | ICD-10-CM | POA: Diagnosis not present

## 2020-07-08 DIAGNOSIS — I420 Dilated cardiomyopathy: Secondary | ICD-10-CM | POA: Diagnosis not present

## 2020-07-08 DIAGNOSIS — J449 Chronic obstructive pulmonary disease, unspecified: Secondary | ICD-10-CM | POA: Diagnosis not present

## 2020-07-08 DIAGNOSIS — J9622 Acute and chronic respiratory failure with hypercapnia: Secondary | ICD-10-CM | POA: Diagnosis not present

## 2020-07-08 DIAGNOSIS — F32A Depression, unspecified: Secondary | ICD-10-CM | POA: Diagnosis not present

## 2020-07-08 DIAGNOSIS — J9620 Acute and chronic respiratory failure, unspecified whether with hypoxia or hypercapnia: Secondary | ICD-10-CM | POA: Diagnosis not present

## 2020-07-08 DIAGNOSIS — Z79899 Other long term (current) drug therapy: Secondary | ICD-10-CM | POA: Diagnosis not present

## 2020-07-08 DIAGNOSIS — I499 Cardiac arrhythmia, unspecified: Secondary | ICD-10-CM | POA: Diagnosis not present

## 2020-07-08 DIAGNOSIS — Z91120 Patient's intentional underdosing of medication regimen due to financial hardship: Secondary | ICD-10-CM | POA: Diagnosis not present

## 2020-07-08 DIAGNOSIS — M7989 Other specified soft tissue disorders: Secondary | ICD-10-CM | POA: Diagnosis not present

## 2020-07-08 DIAGNOSIS — I83009 Varicose veins of unspecified lower extremity with ulcer of unspecified site: Secondary | ICD-10-CM | POA: Diagnosis not present

## 2020-07-08 DIAGNOSIS — F17220 Nicotine dependence, chewing tobacco, uncomplicated: Secondary | ICD-10-CM | POA: Diagnosis not present

## 2020-07-08 DIAGNOSIS — R000 Tachycardia, unspecified: Secondary | ICD-10-CM | POA: Diagnosis not present

## 2020-07-08 DIAGNOSIS — J440 Chronic obstructive pulmonary disease with acute lower respiratory infection: Secondary | ICD-10-CM | POA: Diagnosis not present

## 2020-07-08 DIAGNOSIS — J9621 Acute and chronic respiratory failure with hypoxia: Secondary | ICD-10-CM | POA: Diagnosis not present

## 2020-07-08 DIAGNOSIS — T50904A Poisoning by unspecified drugs, medicaments and biological substances, undetermined, initial encounter: Secondary | ICD-10-CM | POA: Diagnosis not present

## 2020-07-08 DIAGNOSIS — I872 Venous insufficiency (chronic) (peripheral): Secondary | ICD-10-CM | POA: Diagnosis not present

## 2020-07-08 DIAGNOSIS — Z951 Presence of aortocoronary bypass graft: Secondary | ICD-10-CM | POA: Diagnosis not present

## 2020-07-08 DIAGNOSIS — E1140 Type 2 diabetes mellitus with diabetic neuropathy, unspecified: Secondary | ICD-10-CM | POA: Diagnosis not present

## 2020-07-08 DIAGNOSIS — R404 Transient alteration of awareness: Secondary | ICD-10-CM | POA: Diagnosis not present

## 2020-08-09 DIAGNOSIS — J449 Chronic obstructive pulmonary disease, unspecified: Secondary | ICD-10-CM | POA: Diagnosis not present

## 2020-08-09 DIAGNOSIS — J9620 Acute and chronic respiratory failure, unspecified whether with hypoxia or hypercapnia: Secondary | ICD-10-CM | POA: Diagnosis not present

## 2020-08-10 ENCOUNTER — Other Ambulatory Visit: Payer: Self-pay | Admitting: Sports Medicine

## 2020-09-09 DIAGNOSIS — J9620 Acute and chronic respiratory failure, unspecified whether with hypoxia or hypercapnia: Secondary | ICD-10-CM | POA: Diagnosis not present

## 2020-09-09 DIAGNOSIS — J449 Chronic obstructive pulmonary disease, unspecified: Secondary | ICD-10-CM | POA: Diagnosis not present

## 2020-10-10 DIAGNOSIS — J9620 Acute and chronic respiratory failure, unspecified whether with hypoxia or hypercapnia: Secondary | ICD-10-CM | POA: Diagnosis not present

## 2020-10-10 DIAGNOSIS — J449 Chronic obstructive pulmonary disease, unspecified: Secondary | ICD-10-CM | POA: Diagnosis not present

## 2020-12-22 ENCOUNTER — Other Ambulatory Visit: Payer: Self-pay | Admitting: Sports Medicine

## 2020-12-22 MED ORDER — CILOSTAZOL 50 MG PO TABS: 50.0000 mg | ORAL_TABLET | Freq: Two times a day (BID) | ORAL | 3 refills | Status: DC

## 2020-12-24 ENCOUNTER — Other Ambulatory Visit: Payer: Self-pay

## 2020-12-24 ENCOUNTER — Encounter: Payer: Self-pay | Admitting: Sports Medicine

## 2020-12-24 ENCOUNTER — Ambulatory Visit (INDEPENDENT_AMBULATORY_CARE_PROVIDER_SITE_OTHER): Payer: 59 | Admitting: Sports Medicine

## 2020-12-24 ENCOUNTER — Ambulatory Visit (INDEPENDENT_AMBULATORY_CARE_PROVIDER_SITE_OTHER): Payer: 59

## 2020-12-24 DIAGNOSIS — E1165 Type 2 diabetes mellitus with hyperglycemia: Secondary | ICD-10-CM

## 2020-12-24 DIAGNOSIS — Z299 Encounter for prophylactic measures, unspecified: Secondary | ICD-10-CM | POA: Diagnosis not present

## 2020-12-24 DIAGNOSIS — R29898 Other symptoms and signs involving the musculoskeletal system: Secondary | ICD-10-CM

## 2020-12-24 DIAGNOSIS — Z951 Presence of aortocoronary bypass graft: Secondary | ICD-10-CM | POA: Diagnosis not present

## 2020-12-24 DIAGNOSIS — R21 Rash and other nonspecific skin eruption: Secondary | ICD-10-CM

## 2020-12-24 DIAGNOSIS — E119 Type 2 diabetes mellitus without complications: Secondary | ICD-10-CM

## 2020-12-24 DIAGNOSIS — F1510 Other stimulant abuse, uncomplicated: Secondary | ICD-10-CM

## 2020-12-24 DIAGNOSIS — J449 Chronic obstructive pulmonary disease, unspecified: Secondary | ICD-10-CM

## 2020-12-24 MED ORDER — IPRATROPIUM-ALBUTEROL 0.5-2.5 (3) MG/3ML IN SOLN: RESPIRATORY_TRACT | 3 refills | Status: DC

## 2020-12-24 MED ORDER — SHINGRIX 50 MCG/0.5ML IM SUSR: 0.5000 mL | Freq: Once | INTRAMUSCULAR | 0 refills | Status: AC

## 2020-12-24 MED ORDER — AMLODIPINE BESYLATE 10 MG PO TABS: 10.0000 mg | ORAL_TABLET | Freq: Every day | ORAL | 3 refills | Status: DC

## 2020-12-24 MED ORDER — GABAPENTIN 300 MG PO CAPS: 300.0000 mg | ORAL_CAPSULE | Freq: Three times a day (TID) | ORAL | 3 refills | Status: DC

## 2020-12-24 MED ORDER — ALBUTEROL SULFATE (2.5 MG/3ML) 0.083% IN NEBU: INHALATION_SOLUTION | RESPIRATORY_TRACT | 11 refills | Status: DC

## 2020-12-24 NOTE — Assessment & Plan Note (Addendum)
Rechecking lab so we can get a baseline. Uncontrolled type 2 diabetes, starting Synjardy, we may have to try a few in this class to find one that is on formulary.

## 2020-12-24 NOTE — Assessment & Plan Note (Signed)
Adding Shingrix vaccine.

## 2020-12-24 NOTE — Assessment & Plan Note (Signed)
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

## 2020-12-24 NOTE — Assessment & Plan Note (Signed)
Getting updated drug screen.

## 2020-12-24 NOTE — Progress Notes (Addendum)
    Procedures performed today:    None.  Independent interpretation of notes and tests performed by another provider:   None.  Brief History, Exam, Impression, and Recommendations:    Diabetes mellitus type 2, uncontrolled (HCC) Rechecking lab so we can get a baseline. Uncontrolled type 2 diabetes, starting Synjardy, we may have to try a few in this class to find one that is on formulary.  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  Methamphetamine abuse (HCC) Getting updated drug screen.  Preventive measure Adding Shingrix vaccine.    ___________________________________________ Ihor Austin. Benjamin Stain, M.D., ABFM., CAQSM. Primary Care and Sports Medicine [facility]  Adjunct Instructor of Family Medicine  [employer]

## 2020-12-26 MED ORDER — SYNJARDY XR 25-1000 MG PO TB24: 1.0000 | ORAL_TABLET | Freq: Every day | ORAL | 11 refills | Status: DC

## 2020-12-26 NOTE — Addendum Note (Signed)
Addended by: Monica Becton on: 12/26/2020 08:56 AM   Modules accepted: Orders

## 2020-12-31 LAB — LIPID PANEL
Cholesterol: 143 mg/dL (ref <200)
HDL: 40 mg/dL (ref >=40)
LDL Cholesterol (Calc): 78 mg/dL (calc)
Non-HDL Cholesterol (Calc): 103 mg/dL (calc) (ref <130)
Total CHOL/HDL Ratio: 3.6 (calc) (ref <5.0)
Triglycerides: 145 mg/dL (ref <150)

## 2020-12-31 LAB — COMPREHENSIVE METABOLIC PANEL
AG Ratio: 1.2 (calc) (ref 1.0–2.5)
ALT: 21 U/L (ref 9–46)
AST: 19 U/L (ref 10–35)
Albumin: 3.9 g/dL (ref 3.6–5.1)
Alkaline phosphatase (APISO): 81 U/L (ref 35–144)
BUN: 19 mg/dL (ref 7–25)
CO2: 28 mmol/L (ref 20–32)
Calcium: 9.8 mg/dL (ref 8.6–10.3)
Chloride: 100 mmol/L (ref 98–110)
Creat: 0.94 mg/dL (ref 0.70–1.25)
Globulin: 3.3 g/dL (calc) (ref 1.9–3.7)
Glucose, Bld: 208 mg/dL — ABNORMAL HIGH (ref 65–99)
Potassium: 5.3 mmol/L (ref 3.5–5.3)
Sodium: 137 mmol/L (ref 135–146)
Total Bilirubin: 0.5 mg/dL (ref 0.2–1.2)
Total Protein: 7.2 g/dL (ref 6.1–8.1)

## 2020-12-31 LAB — CBC
HCT: 50.5 % — ABNORMAL HIGH (ref 38.5–50.0)
Hemoglobin: 16.9 g/dL (ref 13.2–17.1)
MCH: 29.9 pg (ref 27.0–33.0)
MCHC: 33.5 g/dL (ref 32.0–36.0)
MCV: 89.4 fL (ref 80.0–100.0)
MPV: 10 fL (ref 7.5–12.5)
Platelets: 342 10*3/uL (ref 140–400)
RBC: 5.65 10*6/uL (ref 4.20–5.80)
RDW: 13.2 % (ref 11.0–15.0)
WBC: 8.2 10*3/uL (ref 3.8–10.8)

## 2020-12-31 LAB — DRUG SCREEN, COMPREHENSIVE, WITH CONFIRMATION, URINE: Urine Results: DETECTED — AB

## 2020-12-31 LAB — MICROALBUMIN / CREATININE URINE RATIO
Creatinine, Urine: 158 mg/dL (ref 20–320)
Microalb Creat Ratio: 87 mcg/mg creat — ABNORMAL HIGH (ref <30)
Microalb, Ur: 13.7 mg/dL

## 2020-12-31 LAB — HEMOGLOBIN A1C
Hgb A1c MFr Bld: 10.8 % of total Hgb — ABNORMAL HIGH (ref <5.7)
Mean Plasma Glucose: 263 mg/dL
eAG (mmol/L): 14.6 mmol/L

## 2020-12-31 LAB — TSH: TSH: 1.81 mIU/L (ref 0.40–4.50)

## 2021-01-07 DIAGNOSIS — J449 Chronic obstructive pulmonary disease, unspecified: Secondary | ICD-10-CM | POA: Diagnosis not present

## 2021-01-07 DIAGNOSIS — J9620 Acute and chronic respiratory failure, unspecified whether with hypoxia or hypercapnia: Secondary | ICD-10-CM | POA: Diagnosis not present

## 2021-01-21 ENCOUNTER — Other Ambulatory Visit: Payer: Self-pay

## 2021-01-21 ENCOUNTER — Encounter: Payer: Self-pay | Admitting: Sports Medicine

## 2021-01-21 ENCOUNTER — Ambulatory Visit (INDEPENDENT_AMBULATORY_CARE_PROVIDER_SITE_OTHER): Payer: Medicare Other | Admitting: Sports Medicine

## 2021-01-21 DIAGNOSIS — E1165 Type 2 diabetes mellitus with hyperglycemia: Secondary | ICD-10-CM

## 2021-01-21 NOTE — Assessment & Plan Note (Signed)
Currently on Synjardy, he has been on this now for about a month, we can recheck his hemoglobin A1c in about 2-1/2 months. He does tell me that all of his wounds particular the ones on his legs are starting to heal.

## 2021-01-21 NOTE — Progress Notes (Signed)
    Procedures performed today:    None.  Independent interpretation of notes and tests performed by another provider:   None.  Brief History, Exam, Impression, and Recommendations:    Diabetes mellitus type 2, uncontrolled (HCC) Currently on Synjardy, he has been on this now for about a month, we can recheck his hemoglobin A1c in about 2-1/2 months. He does tell me that all of his wounds particular the ones on his legs are starting to heal.    ___________________________________________ Ihor Austin. Benjamin Stain, M.D., ABFM., CAQSM. Primary Care and Sports Medicine [facility]  Adjunct Instructor of Family Medicine  [employer]

## 2021-02-07 DIAGNOSIS — J449 Chronic obstructive pulmonary disease, unspecified: Secondary | ICD-10-CM | POA: Diagnosis not present

## 2021-02-07 DIAGNOSIS — J9620 Acute and chronic respiratory failure, unspecified whether with hypoxia or hypercapnia: Secondary | ICD-10-CM | POA: Diagnosis not present

## 2021-02-10 ENCOUNTER — Other Ambulatory Visit: Payer: Self-pay | Admitting: Sports Medicine

## 2021-02-10 DIAGNOSIS — R21 Rash and other nonspecific skin eruption: Secondary | ICD-10-CM

## 2021-02-10 MED ORDER — CARVEDILOL 6.25 MG PO TABS: 6.2500 mg | ORAL_TABLET | Freq: Two times a day (BID) | ORAL | 3 refills | Status: DC

## 2021-02-10 MED ORDER — ATORVASTATIN CALCIUM 80 MG PO TABS: 80.0000 mg | ORAL_TABLET | Freq: Every day | ORAL | 3 refills | Status: DC

## 2021-02-10 MED ORDER — CILOSTAZOL 50 MG PO TABS: 50.0000 mg | ORAL_TABLET | Freq: Two times a day (BID) | ORAL | 3 refills | Status: DC

## 2021-02-10 MED ORDER — GABAPENTIN 300 MG PO CAPS: 300.0000 mg | ORAL_CAPSULE | Freq: Three times a day (TID) | ORAL | 3 refills | Status: DC

## 2021-02-10 NOTE — Addendum Note (Signed)
Addended by: Monica Becton on: 02/10/2021 01:46 PM   Modules accepted: Orders

## 2021-02-11 ENCOUNTER — Other Ambulatory Visit: Payer: Self-pay | Admitting: Sports Medicine

## 2021-02-11 DIAGNOSIS — R21 Rash and other nonspecific skin eruption: Secondary | ICD-10-CM

## 2021-02-11 MED ORDER — CILOSTAZOL 50 MG PO TABS: 50.0000 mg | ORAL_TABLET | Freq: Two times a day (BID) | ORAL | 3 refills | Status: DC

## 2021-02-11 MED ORDER — CARVEDILOL 6.25 MG PO TABS: 6.2500 mg | ORAL_TABLET | Freq: Two times a day (BID) | ORAL | 3 refills | Status: DC

## 2021-02-11 MED ORDER — GABAPENTIN 300 MG PO CAPS: 300.0000 mg | ORAL_CAPSULE | Freq: Three times a day (TID) | ORAL | 3 refills | Status: DC

## 2021-02-11 MED ORDER — ATORVASTATIN CALCIUM 80 MG PO TABS: 80.0000 mg | ORAL_TABLET | Freq: Every day | ORAL | 3 refills | Status: DC

## 2021-03-03 ENCOUNTER — Telehealth: Payer: Self-pay | Admitting: Sports Medicine

## 2021-03-03 NOTE — Chronic Care Management (AMB) (Signed)
  Chronic Care Management   Note  03/03/2021 Name: Christopher Robertson MRN: [id number] DOB: [birth year]  Christopher Robertson is a 70 y.o. year old male who is a primary care patient of Benjamin Stain, Ihor Austin, MD. I reached out to [employer] by phone today in response to a referral sent by [name] PCP, Monica Becton, MD.   [name] was given information about Chronic Care Management services today including:  CCM service includes personalized support from designated clinical staff supervised by his physician, including individualized plan of care and coordination with other care providers 24/7 contact phone numbers for assistance for urgent and routine care needs. Service will only be billed when office clinical staff spend 20 minutes or more in a month to coordinate care. Only one practitioner may furnish and bill the service in a calendar month. The patient may stop CCM services at any time (effective at the end of the month) by phone call to the office staff.   Patient agreed to services and verbal consent obtained.   Follow up plan:   Carmell Austria [employer] Scheduler

## 2021-03-09 DIAGNOSIS — J9620 Acute and chronic respiratory failure, unspecified whether with hypoxia or hypercapnia: Secondary | ICD-10-CM | POA: Diagnosis not present

## 2021-03-09 DIAGNOSIS — J449 Chronic obstructive pulmonary disease, unspecified: Secondary | ICD-10-CM | POA: Diagnosis not present

## 2021-04-01 ENCOUNTER — Telehealth: Payer: Self-pay

## 2021-04-01 DIAGNOSIS — R2689 Other abnormalities of gait and mobility: Secondary | ICD-10-CM

## 2021-04-01 DIAGNOSIS — M549 Dorsalgia, unspecified: Secondary | ICD-10-CM | POA: Diagnosis not present

## 2021-04-01 DIAGNOSIS — R079 Chest pain, unspecified: Secondary | ICD-10-CM | POA: Diagnosis not present

## 2021-04-01 DIAGNOSIS — M545 Low back pain, unspecified: Secondary | ICD-10-CM | POA: Diagnosis not present

## 2021-04-01 DIAGNOSIS — R0789 Other chest pain: Secondary | ICD-10-CM | POA: Diagnosis not present

## 2021-04-01 NOTE — Telephone Encounter (Signed)
Patient left msg stating you both spoke about getting a power scooter. He has spoke with a company that needs a prescription and supporting documentation sent to fax [phone]

## 2021-04-02 NOTE — Telephone Encounter (Signed)
Left msg for a return call from patient.  

## 2021-04-02 NOTE — Telephone Encounter (Signed)
They will typically require a mobility evaluation face-to-face office visit, he needs to get the paperwork from them including the sample prescription template and then make an appointment for the mobility evaluation.

## 2021-04-03 ENCOUNTER — Telehealth: Payer: Self-pay | Admitting: General Practice

## 2021-04-03 ENCOUNTER — Telehealth: Payer: Self-pay | Admitting: Pharmacist

## 2021-04-03 NOTE — Chronic Care Management (AMB) (Signed)
    Chronic Care Management Pharmacy Assistant   Name: Christopher Robertson  MRN: [id number] DOB: 1951/05/06  Christopher Robertson is an 70 y.o. year old male who presents for his initial CCM visit with the clinical pharmacist.  Recent office visits:  01/21/21 Christopher Langton MD- Patient was seen for Type 2 Diabetes. Patient will follow up in 2.5 months for A1c recheck.  12/24/20 Christopher Langton MD- Patient was seen for Controlled Type 2 Diabetes. Labs were ordered and referral for Cardiothoracic Surgery was placed. Follow up in 4 weeks.  Recent consult visits:  None noted  Hospital visits:  Medication Reconciliation was completed by comparing discharge summary, patient's EMR and Pharmacy list, and upon discussion with patient.  Admitted to the hospital on 04/02/21 due to S.O.B. Discharge date was 04/02/21. Discharged from [facility].  Patient left without being seen.  New?Medications Started at [facility] Discharge:?? -started none  Medication Changes at Hospital Discharge: -Changed none  Medications Discontinued at Hospital Discharge: -Stopped none  Medications that remain the same after Hospital Discharge:??  -All other medications will remain the same.    Medications: Outpatient Encounter Medications as of 04/03/2021  Medication Sig   albuterol (PROVENTIL) (2.5 MG/3ML) 0.083% nebulizer solution INHALE 1 VIAL VIA NEBULIZER EVERY 6 HOURS AS NEEDED FOR WHEEZING OR SHORTNESS OF BREATH.   amLODipine (NORVASC) 10 MG tablet Take 1 tablet (10 mg total) by mouth daily.   aspirin buffered (BUFFERIN) 325 MG TABS tablet Take by mouth.   atorvastatin (LIPITOR) 80 MG tablet Take 1 tablet (80 mg total) by mouth daily at 6 PM.   buPROPion (ZYBAN) 150 MG 12 hr tablet 1 tab PO qd x3d, then BID, d/c smoking after the first 7d.   carvedilol (COREG) 6.25 MG tablet Take 1 tablet (6.25 mg total) by mouth 2 (two) times daily with a meal.   cilostazol (PLETAL)  50 MG tablet Take 1 tablet (50 mg total) by mouth 2 (two) times daily.   Empagliflozin-metFORMIN HCl ER (SYNJARDY XR) 25-1000 MG TB24 Take 1 tablet by mouth daily.   gabapentin (NEURONTIN) 300 MG capsule Take 1 capsule (300 mg total) by mouth 3 (three) times daily.   hydrOXYzine (ATARAX/VISTARIL) 50 MG tablet TAKE ONE TABLET BY MOUTH EVERY NIGHT AT BEDTIME   ipratropium-albuterol (DUONEB) 0.5-2.5 (3) MG/3ML SOLN INHALE 1 VIAL VIA NEBULIZATION BY MOUTH EVERY 6 HOURS AS NEEDED   lisinopril (ZESTRIL) 40 MG tablet Take 1 tablet (40 mg total) by mouth daily.   PARoxetine (PAXIL) 10 MG tablet TAKE ONE TABLET BY MOUTH DAILY   PARoxetine (PAXIL) 40 MG tablet Take 1 tablet (40 mg total) by mouth daily.   No facility-administered encounter medications on file as of 04/03/2021.   Current Medication List albuterol (PROVENTIL) (2.5 MG/3ML) 0.083% nebu last filled 12/24/20 6 DS aspirin buffered (BUFFERIN) 325 MG TABS tablet atorvastatin (LIPITOR) 80 MG tablet last filled 02/11/21 90 DS carvedilol (COREG) 6.25 MG tablet last filled 02/11/21 90 DS cilostazol (PLETAL) 50 MG tablet last filled 02/23/21 90 DS Empagliflozin-metformin HCl ER (SYNJARDY XR) 25-1000 MG TB24 last filed 02/09/21 90 DS gabapentin (NEURONTIN) 300 MG capsule last filled 02/11/21 90 DS ipratropium-albuterol (DUONEB) 0.5-2.5 (3) MG/3ML SOLN last filled 03/17/21 60 DS   Christopher Robertson CMA Clinical Pharmacist Assistant [phone]

## 2021-04-03 NOTE — Telephone Encounter (Signed)
Transition Care Management Unsuccessful Follow-up Telephone Call  Date of discharge and from where:  04/02/21 from [facility]  Attempts:  1st Attempt  Reason for unsuccessful TCM follow-up call:  Left voice message

## 2021-04-06 ENCOUNTER — Ambulatory Visit: Payer: Medicare Other | Admitting: Sports Medicine

## 2021-04-06 NOTE — Telephone Encounter (Signed)
Transition Care Management Unsuccessful Follow-up Telephone Call  Date of discharge and from where:  04/02/21 from [facility]  Attempts:  2nd Attempt  Reason for unsuccessful TCM follow-up call:  Left voice message Patient does have follow up scheduled with Dr. Karie Schwalbe on 04/07/21.

## 2021-04-07 ENCOUNTER — Other Ambulatory Visit: Payer: Self-pay | Admitting: Sports Medicine

## 2021-04-07 ENCOUNTER — Ambulatory Visit: Payer: Medicare Other | Admitting: Sports Medicine

## 2021-04-07 DIAGNOSIS — T887XXA Unspecified adverse effect of drug or medicament, initial encounter: Secondary | ICD-10-CM | POA: Diagnosis not present

## 2021-04-07 DIAGNOSIS — Z79899 Other long term (current) drug therapy: Secondary | ICD-10-CM | POA: Diagnosis not present

## 2021-04-07 DIAGNOSIS — F17290 Nicotine dependence, other tobacco product, uncomplicated: Secondary | ICD-10-CM | POA: Diagnosis not present

## 2021-04-07 DIAGNOSIS — R0602 Shortness of breath: Secondary | ICD-10-CM | POA: Diagnosis not present

## 2021-04-07 DIAGNOSIS — R509 Fever, unspecified: Secondary | ICD-10-CM | POA: Diagnosis not present

## 2021-04-07 DIAGNOSIS — J449 Chronic obstructive pulmonary disease, unspecified: Secondary | ICD-10-CM | POA: Diagnosis not present

## 2021-04-07 DIAGNOSIS — T50904A Poisoning by unspecified drugs, medicaments and biological substances, undetermined, initial encounter: Secondary | ICD-10-CM | POA: Diagnosis not present

## 2021-04-07 DIAGNOSIS — Z743 Need for continuous supervision: Secondary | ICD-10-CM | POA: Diagnosis not present

## 2021-04-07 DIAGNOSIS — Z20822 Contact with and (suspected) exposure to covid-19: Secondary | ICD-10-CM | POA: Diagnosis not present

## 2021-04-07 DIAGNOSIS — I10 Essential (primary) hypertension: Secondary | ICD-10-CM | POA: Diagnosis not present

## 2021-04-07 DIAGNOSIS — F32A Depression, unspecified: Secondary | ICD-10-CM | POA: Diagnosis not present

## 2021-04-08 MED ORDER — AMBULATORY NON FORMULARY MEDICATION: 0 refills | Status: AC

## 2021-04-08 NOTE — Telephone Encounter (Signed)
LVM for pt to call to discuss.  T. Dexter Sauser, CMA  

## 2021-04-08 NOTE — Addendum Note (Signed)
Addended by: Monica Becton on: 04/08/2021 05:25 PM   Modules accepted: Orders

## 2021-04-08 NOTE — Telephone Encounter (Signed)
Transition Care Management Follow-up Telephone Call Date of discharge and from where: 04/02/21 from [facility] How have you been since you were released from the hospital? Patient had OV with Dr. Karie Schwalbe on 04/07/21. Any questions or concerns? No

## 2021-04-08 NOTE — Telephone Encounter (Signed)
Spoke with patient, he states that [employer] just wants a prescription for a power scooter from [place] to get started on the process.

## 2021-04-08 NOTE — Telephone Encounter (Signed)
Rx written and in box to be faxed. 

## 2021-04-09 DIAGNOSIS — J449 Chronic obstructive pulmonary disease, unspecified: Secondary | ICD-10-CM | POA: Diagnosis not present

## 2021-04-09 DIAGNOSIS — J9620 Acute and chronic respiratory failure, unspecified whether with hypoxia or hypercapnia: Secondary | ICD-10-CM | POA: Diagnosis not present

## 2021-04-09 DIAGNOSIS — Z5189 Encounter for other specified aftercare: Secondary | ICD-10-CM | POA: Diagnosis not present

## 2021-04-09 NOTE — Telephone Encounter (Signed)
Spoke with patient to get company contact info, it is [employer], [employer]. Spoke with Buyer, retail at [employer] They are going to fax over the paperwork that is necessary and patient will need an face-to-face appt.   Please call patient to schedule Face-to-face appt with Dr. Karie Schwalbe for power scooter eval.

## 2021-04-10 ENCOUNTER — Telehealth: Payer: Self-pay | Admitting: General Practice

## 2021-04-10 NOTE — Telephone Encounter (Signed)
Pt is down for 22nd and just wants to do it that day

## 2021-04-10 NOTE — Telephone Encounter (Signed)
Transition Care Management Follow-up Telephone Call Date of discharge and from where: 04/08/21 from [facility] How have you been since you were released from the hospital? Not feeling well. Any questions or concerns? No  Items Reviewed: Did the pt receive and understand the discharge instructions provided? Yes  Medications obtained and verified? Yes  Other? No  Any new allergies since your discharge? No  Dietary orders reviewed? Yes Do you have support at home? Yes   Home Care and Equipment/Supplies: Were home health services ordered? no   Functional Questionnaire: (I = Independent and D = Dependent) ADLs: I  Bathing/Dressing- I  Meal Prep- I  Eating- I  Maintaining continence- I  Transferring/Ambulation- I  Managing Meds- I  Follow up appointments reviewed:  PCP Hospital f/u appt confirmed? Yes  Scheduled to see Dr. Karie Schwalbe on 04/20/21. He doesn't want to schedule anything else with another provider at this time. Specialist Hospital f/u appt confirmed? No  Are transportation arrangements needed? No  If their condition worsens, is the pt aware to call PCP or go to the Emergency Dept.? Yes Was the patient provided with contact information for the PCP's office or ED? Yes Was to pt encouraged to call back with questions or concerns? Yes

## 2021-04-13 ENCOUNTER — Ambulatory Visit (INDEPENDENT_AMBULATORY_CARE_PROVIDER_SITE_OTHER): Payer: Medicare Other | Admitting: Pharmacist

## 2021-04-13 ENCOUNTER — Other Ambulatory Visit: Payer: Self-pay

## 2021-04-13 DIAGNOSIS — F419 Anxiety disorder, unspecified: Secondary | ICD-10-CM

## 2021-04-13 DIAGNOSIS — J449 Chronic obstructive pulmonary disease, unspecified: Secondary | ICD-10-CM

## 2021-04-13 DIAGNOSIS — E1165 Type 2 diabetes mellitus with hyperglycemia: Secondary | ICD-10-CM

## 2021-04-13 DIAGNOSIS — I10 Essential (primary) hypertension: Secondary | ICD-10-CM

## 2021-04-13 DIAGNOSIS — F32A Depression, unspecified: Secondary | ICD-10-CM

## 2021-04-13 NOTE — Progress Notes (Signed)
Chronic Care Management Pharmacy Note  04/15/2021 Name:  Ezel Vallone MRN:  [id number] DOB:  01/18/1951  Summary: addressed DM, HTN, COPD, and depression, though conversation abruptly ended by patient stating he had a poor connection/difficulty hearing the call.   Recommendations/Changes made from today's visit: Patient was not taking a few medications (suggest sending lisinopril, aspirin, hydroxyzine, and paroxetine to [facility] mail order if patient is still supposed to be taking these medications). Unsure about inhaler for COPD maintenance? Also may consider initiating rybelsus 57m daily if a1c not at at goal at next PCP visit.  Plan: f/u with pharmacist in 1 week to finish conversation.  Subjective: DSaintclair Schroaderis an 70y.o. year old male who is a primary patient of Thekkekandam, TGwen Her MD.  The CCM team was consulted for assistance with disease management and care coordination needs.    Engaged with patient by telephone for initial visit in response to provider referral for pharmacy case management and/or care coordination services.   Consent to Services:  The patient was given information about Chronic Care Management services, agreed to services, and gave verbal consent prior to initiation of services.  Please see initial visit note for detailed documentation.   Patient Care Team: TSilverio Decamp MD as PCP - General (Family Medicine) [facility]arius [facility]ump [facility] (Pharmacist[facility]  Recent office visits:  01/21/21 TAundria MemsMD- Patient was seen for Type 2 Diabetes. Patient will follow up in 2.5 months for A1c recheck.   12/24/20 TAundria MemsMD- Patient was seen for Controlled Type 2 Diabetes. Labs were ordered and referral for Cardiothoracic Surgery was placed. Follow up in 4 weeks.   Recent consult visits:  None noted   Hospital visits:  [facility] due to suicidal ideation and cough. Prescribed steroid for  AECOPD.  Admitted to the hospital on 04/02/21 due to S.O.B. Discharge date was 04/02/21. [facility]  Patient left without being seen.   Objective:  Lab Results  Component Value Date   CREATININE 0.94 12/24/2020   CREATININE 1.23 02/15/2020   CREATININE 1.15 11/03/2018    Lab Results  Component Value Date   HGBA1C 10.8 (H) 12/24/2020   Last diabetic Eye exam: No results found for: HMDIABEYEEXA  Last diabetic Foot exam: No results found for: HMDIABFOOTEX      Component Value Date/Time   CHOL 143 12/24/2020 0000   TRIG 145 12/24/2020 0000   HDL 40 12/24/2020 0000   CHOLHDL 3.6 12/24/2020 0000   VLDL 59 (H) 08/21/2012 0955   LDLCALC 78 12/24/2020 0000    Hepatic Function Latest Ref Rng & Units 12/24/2020 02/15/2020 11/03/2018  Total Protein 6.1 - 8.1 g/dL 7.2 6.6 7.2  Albumin 3.5 - 5.2 g/dL - - -  AST 10 - 35 U/L 19 11 13   ALT 9 - 46 U/L 21 15 12   Alk Phosphatase 39 - 117 U/L - - -  Total Bilirubin 0.2 - 1.2 mg/dL 0.5 0.4 0.7    Lab Results  Component Value Date/Time   TSH 1.81 12/24/2020 12:00 AM   TSH 2.11 02/15/2020 02:48 PM    CBC Latest Ref Rng & Units 12/24/2020 02/15/2020 11/03/2018  WBC 3.8 - 10.8 Thousand/uL 8.2 9.6 8.6  Hemoglobin 13.2 - 17.1 g/dL 16.9 15.6 17.1  Hematocrit 38.5 - 50.0 % 50.5(H) 47.2 49.3  Platelets 140 - 400 Thousand/uL 342 329 338    Social History   Tobacco Use  Smoking Status  Every Day   Packs/day: 0.50   Years: 40.00   Pack years: 20.00   Types: Cigarettes  Smokeless Tobacco Former   Types: Chew   BP Readings from Last 3 Encounters:  01/21/21 124/65  12/24/20 113/68  05/02/20 98/65   Pulse Readings from Last 3 Encounters:  01/21/21 73  12/24/20 79  05/02/20 (!) 105   Wt Readings from Last 3 Encounters:  01/21/21 231 lb (104.8 kg)  12/24/20 232 lb (105.2 kg)  05/02/20 242 lb (109.8 kg)    Assessment: Review of patient past medical history, allergies, medications, health  status, including review of consultants reports, laboratory and other test data, was performed as part of comprehensive evaluation and provision of chronic care management services.   SDOH:  (Social Determinants of Health) assessments and interventions performed:    CCM Care Plan  No Known Allergies  Medications Reviewed Today     Reviewed by Darius Bump, [facility] (Pharmacist) on 04/13/21 at 1525  Med List Status: <None>   Medication Order Taking? Sig Documenting Provider Last Dose Status Informant  albuterol (PROVENTIL) (2.5 MG/3ML) 0.083% nebulizer solution [id number] Yes INHALE 1 VIAL VIA NEBULIZER EVERY 6 HOURS AS NEEDED FOR WHEEZING OR SHORTNESS OF BREATH. Silverio Decamp, MD Taking Active   Camillo Flaming MEDICATION [id number] No Motorized scooter with tiller-style handlebars.  Patient not taking: Reported on 04/13/2021   Silverio Decamp, MD Not Taking Active   amLODipine (NORVASC) 10 MG tablet [id number] Yes Take 1 tablet (10 mg total) by mouth daily. Silverio Decamp, MD Taking Active   aspirin buffered (BUFFERIN) 325 MG TABS tablet [id number] No Take by mouth.  Patient not taking: Reported on 04/13/2021   [provider] Not Taking Active   atorvastatin (LIPITOR) 80 MG tablet [id number] Yes Take 1 tablet (80 mg total) by mouth daily at 6 PM. Silverio Decamp, MD Taking Active   buPROPion (ZYBAN) 150 MG 12 hr tablet [id number] No 1 tab PO qd x3d, then BID, d/c smoking after the first 7d.  Patient not taking: Reported on 04/13/2021   Silverio Decamp, MD Not Taking Active   carvedilol (COREG) 6.25 MG tablet [id number] Yes Take 1 tablet (6.25 mg total) by mouth 2 (two) times daily with a meal. Silverio Decamp, MD Taking Active   cilostazol (PLETAL) 50 MG tablet [id number] Yes Take 1 tablet (50 mg total) by mouth 2 (two) times daily. Silverio Decamp, MD Taking Active   Empagliflozin-metFORMIN HCl ER (SYNJARDY XR) 25-1000 MG TB24  [id number] Yes Take 1 tablet by mouth daily. Silverio Decamp, MD Taking Active   gabapentin (NEURONTIN) 300 MG capsule [id number] Yes Take 1 capsule (300 mg total) by mouth 3 (three) times daily. Silverio Decamp, MD Taking Active   hydrOXYzine (ATARAX/VISTARIL) 50 MG tablet [id number] No TAKE ONE TABLET BY MOUTH EVERY NIGHT AT BEDTIME  Patient not taking: Reported on 04/13/2021   Silverio Decamp, MD Not Taking Active   ipratropium-albuterol (DUONEB) 0.5-2.5 (3) MG/3ML Bailey Mech [id number] Yes USE 1 VIAL VIA NEBULIZER  EVERY 6 HOURS AS NEEDED Silverio Decamp, MD Taking Active   lisinopril (ZESTRIL) 40 MG tablet [id number] No Take 1 tablet (40 mg total) by mouth daily.  Patient not taking: Reported on 04/13/2021   Silverio Decamp, MD Not Taking Active   PARoxetine (PAXIL) 10 MG tablet [id number] No TAKE ONE TABLET BY MOUTH DAILY  Patient not taking: Reported on 04/13/2021   Silverio Decamp, MD Not Taking  Active   PARoxetine (PAXIL) 40 MG tablet [id number] No Take 1 tablet (40 mg total) by mouth daily.  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   Balance disorder 10/10/2017   Vestibular dysfunction 10/04/2017   Lumbar spinal stenosis 08/02/2017   Anxiety and depression 08/02/2017   Insomnia 04/22/2015   COPD, moderate ([place]) 01/16/2015   S/P right total hip arthroplasty 12/03/2013   Hydrocele, right 08/18/2012   Hypertension 08/18/2012   Smoker 08/18/2012   Preventive  measure 08/18/2012    Immunization History  Administered Date(s) Administered   Fluad Quad(high Dose 65+) 06/19/2019   Influenza Split 08/18/2012   Influenza, High Dose Seasonal PF 09/06/2017, 11/03/2018   Influenza-Unspecified 05/24/2014   MMR 07/18/2014   Pneumococcal Polysaccharide-23 11/03/2018   Tdap 08/18/2012    Conditions to be addressed/monitored: HTN, COPD, DMII, and Depression  Care Plan : Medication Management  Updates made by Darius Bump, RPH since 04/15/2021 12:00 AM     Problem: HTN, COPD, DM, Depression      Long-Range Goal: Disease Progression Prevention   Start Date: 04/13/2021  This Visit's Progress: On track  Priority: High  Note:   Current Barriers:  Unable to achieve control of chronic conditions   Pharmacist Clinical Goal(s):  Over the next 7 days, patient will adhere to plan to optimize therapeutic regimen for diabetes, HTN, COPD, and depression as evidenced by report of adherence to recommended medication management changes through collaboration with PharmD and provider.   Interventions: 1:1 collaboration with Silverio Decamp, MD regarding development and update of comprehensive plan of care as evidenced by provider attestation and co-signature Inter-disciplinary care team collaboration (see longitudinal plan of care) Comprehensive medication review performed; medication list updated in electronic medical record  Diabetes:  Uncontrolled; current treatment:Synjardy 25-1054m daily; a1c 10.8 at last check  Current glucose readings: not currently checking  Denies hypoglycemic/hyperglycemic symptoms  Current meal patterns:to be discussed at next visit  Current exercise: limited by pain  Recommended continue current regimen, repeat a1c at next PCP visit, if still not at goal consider initiation of rybelsus at future visits.,  Hypertension:  Uncontrolled; current treatment:lisinopril 412mdaily (RX but NOT taking), amlodipine 1050maily,  carvedilol 6.24m38mD;   Current home readings: 140-150/90-100 "when stressed, probably better when relaxed"   Denies hypotensive/hypertensive symptoms  Educated on goal BP, proper technique to check BP at home Recommended continue current regimen, scheduled ongoing discussion for appropriate & safe resumption of lisinopril,  Chronic Obstructive Pulmonary Disease:  Controlled (per patient); current treatment:albuterol and duonebs ;   1-2 exacerbations requiring treatment in the last 6 months   Recommended consider maintenance inhaler, per PCP. Inquired with patient who was unable to provide history of previous inhaler history. If cost was previous issue, can review & initiate patient assistance program with pharmacist at future visits. ,  Depression/Anxiety/SI:  Uncontrolled current treatment:not currently taking medication; patient expressed his recent hospitalization for SI was in error, he states he  was asked "are you depressed, anxious, or feeling suicidal" to which he stated "yes" but was intending to say yes for the depression part. Does have history of previous attempt, but states this current time was not suicidal.  Needs connected to mental health support, ongoing discussions  Recommended patient dial [phone] for immediate mental health needs until he is able to get connected to maintenance services.   Patient Goals/Self-Care Activities Over the next 7 days, patient will:  take medications as prescribed  Follow Up Plan: Telephone follow up appointment with care management team member scheduled for:  1 week      Medication Assistance:  TBD, workup is pending  Patient's preferred pharmacy is:  [facility] [id number] - [place], [address] [place] [address] Phone: [phone] Fax: [place], [place] - [address] [place] [address] Phone:  [phone] Fax: [phone]  [facility]  ([place] - [place] [place] [address] Phone: [phone] Fax: [phone]  Uses pill box? TBD, workup is pending Pt endorses 50% compliance  Follow Up:  Patient agrees to Care Plan and Follow-up.  Plan: Telephone follow up appointment with care management team member scheduled for:  1 week  Darius Bump

## 2021-04-15 DIAGNOSIS — H905 Unspecified sensorineural hearing loss: Secondary | ICD-10-CM | POA: Diagnosis not present

## 2021-04-15 NOTE — Patient Instructions (Signed)
Visit Information   PATIENT GOALS:   Goals Addressed             This Visit's Progress    Medication Management       Patient Goals/Self-Care Activities Over the next 7 days, patient will:  take medications as prescribed  Follow Up Plan: Telephone follow up appointment with care management team member scheduled for:  1 week         Consent to CCM Services: Christopher Robertson was given information about Chronic Care Management services including:  CCM service includes personalized support from designated clinical staff supervised by his physician, including individualized plan of care and coordination with other care providers 24/7 contact phone numbers for assistance for urgent and routine care needs. Service will only be billed when office clinical staff spend 20 minutes or more in a month to coordinate care. Only one practitioner may furnish and bill the service in a calendar month. The patient may stop CCM services at any time (effective at the end of the month) by phone call to the office staff. The patient will be responsible for cost sharing (co-pay) of up to 20% of the service fee (after annual deductible is met).  Patient agreed to services and verbal consent obtained.   Patient verbalizes understanding of instructions provided today and agrees to view in MyChart.   Telephone follow up appointment with care management team member scheduled for: 1 week   J    CLINICAL CARE PLAN: Patient Care Plan: Medication Management     Problem Identified: HTN, COPD, DM, Depression      Long-Range Goal: Disease Progression Prevention   Start Date: 04/13/2021  This Visit's Progress: On track  Priority: High  Note:   Current Barriers:  Unable to achieve control of chronic conditions   Pharmacist Clinical Goal(s):  Over the next 7 days, patient will adhere to plan to optimize therapeutic regimen for diabetes, HTN, COPD, and depression as evidenced by report of  adherence to recommended medication management changes through collaboration with PharmD and provider.   Interventions: 1:1 collaboration with Thekkekandam, Thomas J, MD regarding development and update of comprehensive plan of care as evidenced by provider attestation and co-signature Inter-disciplinary care team collaboration (see longitudinal plan of care) Comprehensive medication review performed; medication list updated in electronic medical record  Diabetes:  Uncontrolled; current treatment:Synjardy 25-1000mg daily; a1c 10.8 at last check  Current glucose readings: not currently checking  Denies hypoglycemic/hyperglycemic symptoms  Current meal patterns:to be discussed at next visit  Current exercise: limited by pain  Recommended continue current regimen, repeat a1c at next PCP visit, if still not at goal consider initiation of rybelsus at future visits.,  Hypertension:  Uncontrolled; current treatment:lisinopril 40mg daily (RX but NOT taking), amlodipine 10mg daily, carvedilol 6.25mg BID;   Current home readings: 140-150/90-100 "when stressed, probably better when relaxed"   Denies hypotensive/hypertensive symptoms  Educated on goal BP, proper technique to check BP at home Recommended continue current regimen, scheduled ongoing discussion for appropriate & safe resumption of lisinopril,  Chronic Obstructive Pulmonary Disease:  Controlled (per patient); current treatment:albuterol and duonebs ;   1-2 exacerbations requiring treatment in the last 6 months   Recommended consider maintenance inhaler, per PCP. Inquired with patient who was unable to provide history of previous inhaler history. If cost was previous issue, can review & initiate patient assistance program with pharmacist at future visits. ,  Depression/Anxiety/SI:  Uncontrolled current treatment:not currently taking medication; patient expressed his recent hospitalization for   SI was in error, he states he was asked "are you  depressed, anxious, or feeling suicidal" to which he stated "yes" but was intending to say yes for the depression part. Does have history of previous attempt, but states this current time was not suicidal.  Needs connected to mental health support, ongoing discussions  Recommended patient dial [phone] for immediate mental health needs until he is able to get connected to maintenance services.   Patient Goals/Self-Care Activities Over the next 7 days, patient will:  take medications as prescribed  Follow Up Plan: Telephone follow up appointment with care management team member scheduled for:  1 week

## 2021-04-16 NOTE — Addendum Note (Signed)
Addended by: Monica Becton on: 04/16/2021 09:29 AM   Modules accepted: Level of Service

## 2021-04-16 NOTE — Addendum Note (Signed)
Addended by: Monica Becton on: 04/16/2021 09:32 AM   Modules accepted: Level of Service

## 2021-04-20 ENCOUNTER — Other Ambulatory Visit: Payer: Self-pay

## 2021-04-20 ENCOUNTER — Encounter: Payer: Self-pay | Admitting: Sports Medicine

## 2021-04-20 ENCOUNTER — Ambulatory Visit (INDEPENDENT_AMBULATORY_CARE_PROVIDER_SITE_OTHER): Payer: Medicare Other | Admitting: Sports Medicine

## 2021-04-20 VITALS — BP 135/78 | HR 97 | Ht 72.0 in | Wt 234.0 lb

## 2021-04-20 DIAGNOSIS — E1165 Type 2 diabetes mellitus with hyperglycemia: Secondary | ICD-10-CM

## 2021-04-20 DIAGNOSIS — F32A Depression, unspecified: Secondary | ICD-10-CM

## 2021-04-20 DIAGNOSIS — F419 Anxiety disorder, unspecified: Secondary | ICD-10-CM | POA: Diagnosis not present

## 2021-04-20 DIAGNOSIS — Z7409 Other reduced mobility: Secondary | ICD-10-CM | POA: Diagnosis not present

## 2021-04-20 DIAGNOSIS — G894 Chronic pain syndrome: Secondary | ICD-10-CM

## 2021-04-20 LAB — POCT GLYCOSYLATED HEMOGLOBIN (HGB A1C): HbA1c, POC (controlled diabetic range): 7.5 % — AB (ref 0.0–7.0)

## 2021-04-20 MED ORDER — DULOXETINE HCL 30 MG PO CPEP: 30.0000 mg | ORAL_CAPSULE | Freq: Every day | ORAL | 3 refills | Status: DC

## 2021-04-20 NOTE — Assessment & Plan Note (Signed)
Worsening anxiety and depression, adding Cymbalta. He has not yet touch base with psychiatry as I asked him to do about a year ago. No current suicidal or homicidal ideation.

## 2021-04-20 NOTE — Assessment & Plan Note (Signed)
We have discussed getting a powered wheelchair/scooter, he is still looking for companies, we will need a mobility evaluation and an order from the scooter company.

## 2021-04-20 NOTE — Assessment & Plan Note (Signed)
Referral to pain management for chronic back pain per patient request

## 2021-04-20 NOTE — Progress Notes (Signed)
    Procedures performed today:    None.  Independent interpretation of notes and tests performed by another provider:   None.  Brief History, Exam, Impression, and Recommendations:    Diabetes mellitus type 2, uncontrolled (HCC) Current on Synjardy, A1c is now well controlled, no change in plan.   Anxiety and depression Worsening anxiety and depression, adding Cymbalta. He has not yet touch base with psychiatry as I asked him to do about a year ago. No current suicidal or homicidal ideation.  Limited mobility We have discussed getting a powered wheelchair/scooter, he is still looking for companies, we will need a mobility evaluation and an order from the scooter company.  Chronic pain syndrome Referral to pain management for chronic back pain per patient request  Chronic process with exacerbation and pharmacologic intervention.  ___________________________________________ Ihor Austin. Benjamin Stain, M.D., ABFM., CAQSM. Primary Care and Sports Medicine [facility]  Adjunct Instructor of Family Medicine  [employer]

## 2021-04-20 NOTE — Addendum Note (Signed)
Addended by: Monica Becton on: 04/20/2021 03:59 PM   Modules accepted: Orders

## 2021-04-20 NOTE — Assessment & Plan Note (Signed)
Current on Synjardy, A1c is now well controlled, no change in plan.

## 2021-04-23 ENCOUNTER — Other Ambulatory Visit: Payer: Self-pay

## 2021-04-23 ENCOUNTER — Ambulatory Visit: Payer: Medicare Other | Admitting: Pharmacist

## 2021-04-23 DIAGNOSIS — I10 Essential (primary) hypertension: Secondary | ICD-10-CM | POA: Diagnosis not present

## 2021-04-23 DIAGNOSIS — F419 Anxiety disorder, unspecified: Secondary | ICD-10-CM

## 2021-04-23 DIAGNOSIS — E1165 Type 2 diabetes mellitus with hyperglycemia: Secondary | ICD-10-CM | POA: Diagnosis not present

## 2021-04-23 DIAGNOSIS — J449 Chronic obstructive pulmonary disease, unspecified: Secondary | ICD-10-CM

## 2021-04-23 DIAGNOSIS — F32A Depression, unspecified: Secondary | ICD-10-CM

## 2021-04-24 NOTE — Patient Instructions (Signed)
Visit Information  PATIENT GOALS:  Goals Addressed             This Visit's Progress    Medication Management       Patient Goals/Self-Care Activities Over the next 30 days, patient will:  take medications as prescribed  Follow Up Plan: Telephone follow up appointment with care management team member scheduled for:  1 month        Patient verbalizes understanding of instructions provided today and agrees to view in MyChart.   Telephone follow up appointment with care management team member scheduled for: 1 month  Svara Twyman J Arcenio Mullaly  

## 2021-04-24 NOTE — Progress Notes (Signed)
Chronic Care Management Pharmacy Note  04/24/2021 Name:  Christopher Robertson MRN:  [id number] DOB:  03/26/1951  Summary: addressed DM, HTN, mental health  Recommendations/Changes made from today's visit: discontinue lisinopril at this time, patient is non-compliant and SBP in 130s last visit.   Plan: f/u with pharmacist in 1 month  Subjective: Christopher Robertson is an 70 y.o. year old male who is a primary patient of Thekkekandam, Gwen Her, MD.  The CCM team was consulted for assistance with disease management and care coordination needs.    Engaged with patient by telephone for follow up visit in response to provider referral for pharmacy case management and/or care coordination services.   Consent to Services:  The patient was given information about Chronic Care Management services, agreed to services, and gave verbal consent prior to initiation of services.  Please see initial visit note for detailed documentation.   Patient Care Team: Silverio Decamp, MD as PCP - General (Family Medicine) Darius Bump, [facility] as Pharmacist (Pharmacist)  Objective:  Lab Results  Component Value Date   CREATININE 0.94 12/24/2020   CREATININE 1.23 02/15/2020   CREATININE 1.15 11/03/2018    Lab Results  Component Value Date   HGBA1C 7.5 (A) 04/20/2021       Component Value Date/Time   CHOL 143 12/24/2020 0000   TRIG 145 12/24/2020 0000   HDL 40 12/24/2020 0000   CHOLHDL 3.6 12/24/2020 0000   VLDL 59 (H) 08/21/2012 0955   LDLCALC 78 12/24/2020 0000    Hepatic Function Latest Ref Rng & Units 12/24/2020 02/15/2020 11/03/2018  Total Protein 6.1 - 8.1 g/dL 7.2 6.6 7.2  Albumin 3.5 - 5.2 g/dL - - -  AST 10 - 35 U/L _0 ALT 9 - 46 U/L _1 Alk Phosphatase 39 - 117 U/L - - -  Total Bilirubin 0.2 - 1.2 mg/dL 0.5 0.4 0.7    Lab Results  Component Value Date/Time   TSH 1.81 12/24/2020 12:00 AM   TSH 2.11 02/15/2020 02:48 PM    CBC Latest Ref Rng & Units  12/24/2020 02/15/2020 11/03/2018  WBC 3.8 - 10.8 Thousand/uL 8.2 9.6 8.6  Hemoglobin 13.2 - 17.1 g/dL 16.9 15.6 17.1  Hematocrit 38.5 - 50.0 % 50.5(H) 47.2 49.3  Platelets 140 - 400 Thousand/uL 342 329 338    No results found for: VD25OH  Clinical ASCVD: Yes  The ASCVD Risk score Mikey Bussing DC Jr., et al., 2013) failed to calculate for the following reasons:   The patient has a prior MI or stroke diagnosis     Social History   Tobacco Use  Smoking Status Every Day   Packs/day: 0.50   Years: 40.00   Pack years: 20.00   Types: Cigarettes  Smokeless Tobacco Former   Types: Chew   BP Readings from Last 3 Encounters:  04/20/21 135/78  01/21/21 124/65  12/24/20 113/68   Pulse Readings from Last 3 Encounters:  04/20/21 97  01/21/21 73  12/24/20 79   Wt Readings from Last 3 Encounters:  04/20/21 234 lb (106.1 kg)  01/21/21 231 lb (104.8 kg)  12/24/20 232 lb (105.2 kg)    Assessment: Review of patient past medical history, allergies, medications, health status, including review of consultants reports, laboratory and other test data, was performed as part of comprehensive evaluation and provision of chronic care management services.   SDOH:  (Social Determinants of Health) assessments and interventions performed:    [place]  No Known Allergies  Medications Reviewed Today     Reviewed by Dema Severin, CMA (Certified Medical Assistant) on 04/20/21 at 1530  Med List Status: <None>   Medication Order Taking? Sig Documenting Provider Last Dose Status Informant    Discontinued 04/20/21 1529 (Patient Preference)   AMBULATORY NON FORMULARY MEDICATION [id number]  Motorized scooter with tiller-style handlebars.  Patient not taking: Reported on 04/13/2021   Silverio Decamp, MD  Active   amLODipine (NORVASC) 10 MG tablet [id number]  Take 1 tablet (10 mg total) by mouth daily. Silverio Decamp, MD  Active    Patient not taking:   Discontinued 04/20/21 1529 (Patient  Preference)   atorvastatin (LIPITOR) 80 MG tablet [id number]  Take 1 tablet (80 mg total) by mouth daily at 6 PM. Silverio Decamp, MD  Active    Patient not taking:   Discontinued 04/20/21 1529 (Patient Preference)   carvedilol (COREG) 6.25 MG tablet [id number]  Take 1 tablet (6.25 mg total) by mouth 2 (two) times daily with a meal. Silverio Decamp, MD  Active   cilostazol (PLETAL) 50 MG tablet [id number]  Take 1 tablet (50 mg total) by mouth 2 (two) times daily. Silverio Decamp, MD  Active   Empagliflozin-metFORMIN HCl ER (SYNJARDY XR) 25-1000 MG TB24 [id number]  Take 1 tablet by mouth daily. Silverio Decamp, MD  Active   gabapentin (NEURONTIN) 300 MG capsule [id number]  Take 1 capsule (300 mg total) by mouth 3 (three) times daily. Silverio Decamp, MD  Active    Patient not taking:   Discontinued 04/20/21 1529 (Patient Preference)   ipratropium-albuterol (DUONEB) 0.5-2.5 (3) MG/3ML SOLN [id number]  USE 1 VIAL VIA NEBULIZER  EVERY 6 HOURS AS NEEDED Silverio Decamp, MD  Active    Patient not taking:   Discontinued 04/20/21 1530 (Patient Preference)    Patient not taking:   Discontinued 04/20/21 1530 (Patient Preference)    Patient not taking:   Discontinued 04/20/21 1530 (Patient Preference)             Patient Active Problem List   Diagnosis Date Noted   Limited mobility 04/20/2021   Chronic pain syndrome 04/20/2021   Peripheral arterial disease ([place]) 02/26/2020   Diabetes mellitus type 2, uncontrolled ([place]) 02/16/2020   Skin necrosis ([place]) 02/15/2020   Methamphetamine abuse ([place]) 01/14/2020   Accidental drug overdose 01/14/2020   Acute respiratory failure with hypoxia and hypercapnia ([place]) 01/14/2020   Hx of CABG 06/19/2019   Callus of foot 02/12/2019   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   Balance disorder 10/10/2017   Vestibular dysfunction 10/04/2017   Lumbar spinal stenosis 08/02/2017   Anxiety and depression 08/02/2017   Insomnia 04/22/2015    COPD, moderate ([place]) 01/16/2015   S/P right total hip arthroplasty 12/03/2013   Hydrocele, right 08/18/2012   Hypertension 08/18/2012   Smoker 08/18/2012   Preventive measure 08/18/2012    Immunization History  Administered Date(s) Administered   Fluad Quad(high Dose 65+) 06/19/2019   Influenza Split 08/18/2012   Influenza, High Dose Seasonal PF 09/06/2017, 11/03/2018   Influenza-Unspecified 05/24/2014   MMR 07/18/2014   Pneumococcal Polysaccharide-23 11/03/2018   Tdap 08/18/2012    Conditions to be addressed/monitored: HTN, DMII, Anxiety, and Depression  There are no care plans that you recently modified to display for this patient.   Medication Assistance: None required.  Patient affirms current coverage meets needs.  Patient's preferred pharmacy is:  [employer]  ([place] - [place], [place]  [address] [place] [address] Phone: [phone] Fax: [phone]  [id number] [address]Phone: [phone]: [phone] [id number] [address] [place]: [phone]: [phone] pill box? No -   Pt endorses 80% compliance  Follow Up:  Patient agrees to Care Plan and Follow-up.  Plan: Telephone follow up appointment with care management team member scheduled for:  1 month  KeDarius Bump

## 2021-05-12 ENCOUNTER — Other Ambulatory Visit: Payer: Self-pay

## 2021-05-12 DIAGNOSIS — F32A Depression, unspecified: Secondary | ICD-10-CM

## 2021-05-12 DIAGNOSIS — F419 Anxiety disorder, unspecified: Secondary | ICD-10-CM

## 2021-05-12 DIAGNOSIS — E1165 Type 2 diabetes mellitus with hyperglycemia: Secondary | ICD-10-CM

## 2021-05-12 DIAGNOSIS — R21 Rash and other nonspecific skin eruption: Secondary | ICD-10-CM

## 2021-05-12 DIAGNOSIS — E119 Type 2 diabetes mellitus without complications: Secondary | ICD-10-CM

## 2021-05-12 MED ORDER — CARVEDILOL 6.25 MG PO TABS: 6.2500 mg | ORAL_TABLET | Freq: Two times a day (BID) | ORAL | 3 refills | Status: DC

## 2021-05-12 MED ORDER — GABAPENTIN 300 MG PO CAPS: 300.0000 mg | ORAL_CAPSULE | Freq: Three times a day (TID) | ORAL | 3 refills | Status: AC

## 2021-05-12 MED ORDER — CILOSTAZOL 50 MG PO TABS: 50.0000 mg | ORAL_TABLET | Freq: Two times a day (BID) | ORAL | 3 refills | Status: AC

## 2021-05-12 MED ORDER — DULOXETINE HCL 30 MG PO CPEP: 30.0000 mg | ORAL_CAPSULE | Freq: Every day | ORAL | 3 refills | Status: DC

## 2021-05-12 MED ORDER — AMLODIPINE BESYLATE 10 MG PO TABS: 10.0000 mg | ORAL_TABLET | Freq: Every day | ORAL | 0 refills | Status: DC

## 2021-05-12 MED ORDER — SYNJARDY XR 25-1000 MG PO TB24
1.0000 | ORAL_TABLET | Freq: Every day | ORAL | 3 refills | Status: DC
Start: 2021-05-12 — End: 2022-07-16

## 2021-05-12 MED ORDER — ATORVASTATIN CALCIUM 80 MG PO TABS: 80.0000 mg | ORAL_TABLET | Freq: Every day | ORAL | 3 refills | Status: DC

## 2021-05-21 ENCOUNTER — Telehealth: Payer: Medicare Other

## 2021-05-21 ENCOUNTER — Other Ambulatory Visit: Payer: Self-pay

## 2021-05-21 DIAGNOSIS — J449 Chronic obstructive pulmonary disease, unspecified: Secondary | ICD-10-CM

## 2021-05-21 MED ORDER — IPRATROPIUM-ALBUTEROL 0.5-2.5 (3) MG/3ML IN SOLN: RESPIRATORY_TRACT | 5 refills | Status: DC

## 2021-05-21 NOTE — Progress Notes (Deleted)
Chronic Care Management Pharmacy Note  05/21/2021 Name:  Christopher Robertson MRN:  [id number] DOB:  Jul 09, 1951  Summary: addressed DM, HTN, mental health  Recommendations/Changes made from today's visit: discontinue lisinopril at this time, patient is non-compliant and SBP in 130s last visit.   Plan: f/u with pharmacist in 1 month  Subjective: Christopher Robertson is an 70 y.o. year old male who is a primary patient of Christopher Robertson, Christopher Her, MD.  The CCM team was consulted for assistance with disease management and care coordination needs.    Engaged with patient by telephone for follow up visit in response to provider referral for pharmacy case management and/or care coordination services.   Consent to Services:  The patient was given information about Chronic Care Management services, agreed to services, and gave verbal consent prior to initiation of services.  Please see initial visit note for detailed documentation.   Patient Care Team: Christopher Decamp, MD as PCP - General (Family Medicine) Christopher Robertson, Christopher Robertson as Pharmacist (Pharmacist)  Objective:  Lab Results  Component Value Date   CREATININE 0.94 12/24/2020   CREATININE 1.23 02/15/2020   CREATININE 1.15 11/03/2018    Lab Results  Component Value Date   HGBA1C 7.5 (A) 04/20/2021       Component Value Date/Time   CHOL 143 12/24/2020 0000   TRIG 145 12/24/2020 0000   HDL 40 12/24/2020 0000   CHOLHDL 3.6 12/24/2020 0000   VLDL 59 (H) 08/21/2012 0955   LDLCALC 78 12/24/2020 0000    Hepatic Function Latest Ref Rng & Units 12/24/2020 02/15/2020 11/03/2018  Total Protein 6.1 - 8.1 g/dL 7.2 6.6 7.2  Albumin 3.5 - 5.2 g/dL - - -  AST 10 - 35 U/L 19 11 13   ALT 9 - 46 U/L 21 15 12   Alk Phosphatase 39 - 117 U/L - - -  Total Bilirubin 0.2 - 1.2 mg/dL 0.5 0.4 0.7    Lab Results  Component Value Date/Time   TSH 1.81 12/24/2020 12:00 AM   TSH 2.11 02/15/2020 02:48 PM    CBC Latest Ref Rng & Units  12/24/2020 02/15/2020 11/03/2018  WBC 3.8 - 10.8 Thousand/uL 8.2 9.6 8.6  Hemoglobin 13.2 - 17.1 g/dL 16.9 15.6 17.1  Hematocrit 38.5 - 50.0 % 50.5(H) 47.2 49.3  Platelets 140 - 400 Thousand/uL 342 329 338    No results found for: VD25OH  Clinical ASCVD: Yes  The ASCVD Risk score (Arnett DK, et al., 2019) failed to calculate for the following reasons:   The patient has a prior MI or stroke diagnosis     Social History   Tobacco Use  Smoking Status Every Day   Packs/day: 0.50   Years: 40.00   Pack years: 20.00   Types: Cigarettes  Smokeless Tobacco Former   Types: Chew   BP Readings from Last 3 Encounters:  04/20/21 135/78  01/21/21 124/65  12/24/20 113/68   Pulse Readings from Last 3 Encounters:  04/20/21 97  01/21/21 73  12/24/20 79   Wt Readings from Last 3 Encounters:  04/20/21 234 lb (106.1 kg)  01/21/21 231 lb (104.8 kg)  12/24/20 232 lb (105.2 kg)    Assessment: Review of patient past medical history, allergies, medications, health status, including review of consultants reports, laboratory and other test data, was performed as part of comprehensive evaluation and provision of chronic care management services.   SDOH:  (Social Determinants of Health) assessments and interventions performed:    [name]  No  Known Allergies  Medications Reviewed Today     Reviewed by Dema Severin, CMA (Certified Medical Assistant) on 04/20/21 at 71  Med List Status: <None>   Medication Order Taking? Sig Documenting Provider Last Dose Status Informant    Discontinued 04/20/21 1529 (Patient Preference)   AMBULATORY NON FORMULARY MEDICATION [id number]  Motorized scooter with tiller-style handlebars.  Patient not taking: Reported on 04/13/2021   Christopher Decamp, MD  Active   amLODipine (NORVASC) 10 MG tablet [id number]  Take 1 tablet (10 mg total) by mouth daily. Christopher Decamp, MD  Active    Patient not taking:   Discontinued 04/20/21 1529 (Patient  Preference)   atorvastatin (LIPITOR) 80 MG tablet [id number]  Take 1 tablet (80 mg total) by mouth daily at 6 PM. Christopher Decamp, MD  Active    Patient not taking:   Discontinued 04/20/21 1529 (Patient Preference)   carvedilol (COREG) 6.25 MG tablet [id number]  Take 1 tablet (6.25 mg total) by mouth 2 (two) times daily with a meal. Christopher Decamp, MD  Active   cilostazol (PLETAL) 50 MG tablet [id number]  Take 1 tablet (50 mg total) by mouth 2 (two) times daily. Christopher Decamp, MD  Active   Empagliflozin-metFORMIN HCl ER (SYNJARDY XR) 25-1000 MG TB24 [id number]  Take 1 tablet by mouth daily. Christopher Decamp, MD  Active   gabapentin (NEURONTIN) 300 MG capsule [id number]  Take 1 capsule (300 mg total) by mouth 3 (three) times daily. Christopher Decamp, MD  Active    Patient not taking:   Discontinued 04/20/21 1529 (Patient Preference)   ipratropium-albuterol (DUONEB) 0.5-2.5 (3) MG/3ML SOLN [id number]  USE 1 VIAL VIA NEBULIZER  EVERY 6 HOURS AS NEEDED Christopher Decamp, MD  Active    Patient not taking:   Discontinued 04/20/21 1530 (Patient Preference)    Patient not taking:   Discontinued 04/20/21 1530 (Patient Preference)    Patient not taking:   Discontinued 04/20/21 1530 (Patient Preference)             Patient Active Problem List   Diagnosis Date Noted   Limited mobility 04/20/2021   Chronic pain syndrome 04/20/2021   Peripheral arterial disease ([place]) 02/26/2020   Diabetes mellitus type 2, uncontrolled ([place]) 02/16/2020   Skin necrosis ([place]) 02/15/2020   Methamphetamine abuse ([place]) 01/14/2020   Accidental drug overdose 01/14/2020   Acute respiratory failure with hypoxia and hypercapnia ([place]) 01/14/2020   Hx of CABG 06/19/2019   Callus of foot 02/12/2019   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   Balance disorder 10/10/2017   Vestibular dysfunction 10/04/2017   Lumbar spinal stenosis 08/02/2017   Anxiety and depression 08/02/2017   Insomnia 04/22/2015    COPD, moderate ([place]) 01/16/2015   S/P right total hip arthroplasty 12/03/2013   Hydrocele, right 08/18/2012   Hypertension 08/18/2012   Smoker 08/18/2012   Preventive measure 08/18/2012    Immunization History  Administered Date(s) Administered   Fluad Quad(high Dose 65+) 06/19/2019   Influenza Split 08/18/2012   Influenza, High Dose Seasonal PF 09/06/2017, 11/03/2018   Influenza-Unspecified 05/24/2014   MMR 07/18/2014   Pneumococcal Polysaccharide-23 11/03/2018   Tdap 08/18/2012    Conditions to be addressed/monitored: HTN, DMII, Anxiety, and Depression  There are no care plans that you recently modified to display for this patient.   Medication Assistance: None required.  Patient affirms current coverage meets needs.  Patient's preferred pharmacy is:  [employer]  ([place]) - [place], [address]  [facility] [address] [place] [address] Phone: [phone] Fax: [phone]  [id number] [address]Phone: [phone]: [phone] [id number] [address]: [phone]: [phone]: [phone]: [phone] pill box? No -   Pt endorses 80% compliance  Follow Up:  Patient agrees to Care Plan and Follow-up.  Plan: Telephone follow up appointment with care management team member scheduled for:  1 month  KeDarius Robertson

## 2021-05-22 ENCOUNTER — Telehealth: Payer: Self-pay | Admitting: Pharmacist

## 2021-05-22 NOTE — Telephone Encounter (Signed)
Unsuccessful attempt x2 to contact patient for follow-up phone call for CCM services with pharmacist.  Will route to schedule team for reschedule.  

## 2021-05-25 NOTE — Chronic Care Management (AMB) (Signed)
  Care Management   Note  05/25/2021 Name: Christopher Robertson MRN: [id number] DOB: 07-26-1951  Christopher Robertson is a 70 y.o. year old male who is a primary care patient of Benjamin Stain, Ihor Austin, MD and is actively engaged with the care management team. I reached out to [facility] by phone today to assist with re-scheduling a follow up visit with the Pharmacist  Follow up plan: Telephone appointment with care management team member scheduled for: 05/29/2021  Burman Nieves, CCMA Care Guide, Embedded Care Coordination [facility] Health  Care Management  Direct Dial: [phone]

## 2021-05-26 ENCOUNTER — Encounter: Payer: Self-pay | Admitting: Physical Therapy

## 2021-05-26 ENCOUNTER — Ambulatory Visit (INDEPENDENT_AMBULATORY_CARE_PROVIDER_SITE_OTHER): Payer: Medicare HMO | Admitting: Physical Therapy

## 2021-05-26 ENCOUNTER — Other Ambulatory Visit: Payer: Self-pay

## 2021-05-26 DIAGNOSIS — R293 Abnormal posture: Secondary | ICD-10-CM

## 2021-05-26 DIAGNOSIS — M545 Low back pain, unspecified: Secondary | ICD-10-CM | POA: Diagnosis not present

## 2021-05-26 DIAGNOSIS — G8929 Other chronic pain: Secondary | ICD-10-CM

## 2021-05-26 DIAGNOSIS — M6281 Muscle weakness (generalized): Secondary | ICD-10-CM

## 2021-05-26 DIAGNOSIS — R262 Difficulty in walking, not elsewhere classified: Secondary | ICD-10-CM

## 2021-05-26 NOTE — Patient Instructions (Signed)
Access Code: [email or web address] URL: [email or web address] Date: 05/26/2021 Prepared by: Reggy Eye  Exercises Supine Lower Trunk Rotation - 1 x daily - 7 x weekly - 2 sets - 10 reps Clamshell - 1 x daily - 7 x weekly - 2 sets - 10 reps Sidelying Hip Abduction - 1 x daily - 7 x weekly - 2 sets - 10 reps Standing Hip Extension with Counter Support - 1 x daily - 7 x weekly - 2 sets - 10 reps Seated Correct Posture - 1 x daily - 7 x weekly - 1 sets - 10 reps

## 2021-05-26 NOTE — Therapy (Signed)
[facility] [address] [place], [place], [address] Phone: [phone]   Fax:  [phone]  Physical Therapy Evaluation  Patient Details  Name: Christopher Robertson MRN: [id number] Date of Birth: [birth year] Referring Provider (PT): Ardell Isaacs   Encounter Date: 05/26/2021   PT End of Session - 05/26/21 1112     Visit Number 1    Number of Visits 12    Date for PT Re-Evaluation 07/07/21    Authorization Type [employer]    Progress Note Due on Visit 10    PT Start Time 1015    PT Stop Time 1100    PT Time Calculation (min) 45 min    Activity Tolerance Patient tolerated treatment well    Behavior During Therapy [facility] for tasks assessed/performed             Past Medical History:  Diagnosis Date   COPD (chronic obstructive pulmonary disease) (HCC)    History of concussion    yrs ago-- no residual   Hypertension    OA (osteoarthritis)    Right hydrocele     Past Surgical History:  Procedure Laterality Date   FEMUR FRACTURE SURGERY  1985   HYDROCELE EXCISION Right 05/23/2015   Procedure: RIGHT HYDROCELECTOMY ADULT;  Surgeon: Ihor Gully, MD;  Location: [facility];  Service: Urology;  Laterality: Right;   KNEE SURGERY Bilateral x5 last one 2003   TOTAL HIP ARTHROPLASTY Right 02-28-2015    There were no vitals filed for this visit.    Subjective Assessment - 05/26/21 1020     Subjective Pt has been having shooting pain in back going down Lt LE x 2 years which has been worsening lately. Pt began using rollator for gait about 1 year ago due to pain with increased wt bearing on Lt LE. MD recommends trial of PT before moving on to next steps. Pain increases with wt bearing while standing and walking, decreases with sitting with forward flexed posture, meds.    Pertinent History Rt hip replacement, Rt femur fracture    How long can you stand comfortably? not at all    How long can you walk  comfortably? not at all    Diagnostic tests none in past year    Patient Stated Goals decrease pain during standing and gait    Currently in Pain? Yes    Pain Score 8     Pain Location Back    Pain Orientation Left;Lower;Medial    Pain Descriptors / Indicators Shooting;Sharp    Pain Type Chronic pain    Pain Onset More than a month ago    Pain Frequency Constant    Aggravating Factors  standing, walking    Pain Relieving Factors meds                [facility] PT Assessment - 05/26/21 0001       Assessment   Medical Diagnosis low back pain, lumbosacral spondylosis    Referring Provider (PT) Ardell Isaacs    Onset Date/Surgical Date 05/22/19    Next MD Visit none scheduled    Prior Therapy 06/08/21      Precautions   Precautions None      Balance Screen   Has the patient fallen in the past 6 months Yes    How many times? 6    Has the patient had a decrease in activity level because of a fear of falling?  Yes    Is the patient  reluctant to leave their home because of a fear of falling?  No      Prior Function   Level of Independence Independent      Observation/Other Assessments   Focus on Therapeutic Outcomes (FOTO)  34      Posture/Postural Control   Posture Comments forward flexed lumbar      ROM / Strength   AROM / PROM / Strength AROM;Strength      AROM   AROM Assessment Site Lumbar    Lumbar Flexion WFL    Lumbar Extension unable to come to neutral due to pain    Lumbar - Right Side Bend limited 50%    Lumbar - Left Side Bend to neutral    Lumbar - Right Rotation limited 50%    Lumbar - Left Rotation limited 50%      Strength   Overall Strength Comments Lt ankle DF 2/5    Strength Assessment Site Hip    Right/Left Hip Right;Left    Right Hip Flexion 4-/5    Right Hip Extension 3-/5    Right Hip ABduction 3+/5    Left Hip Flexion 4-/5    Left Hip Extension 3-/5    Left Hip ABduction 3+/5      Flexibility   Soft Tissue Assessment /Muscle Length yes     Hamstrings decreased bilat      Palpation   Spinal mobility hypomobile lumbar spine    Palpation comment mild TTP bilat lumbar paraspinals      Special Tests   Other special tests Thomas + bilat, SLR - bilat                        Objective measurements completed on examination: See above findings.       [facility] Adult PT Treatment/Exercise - 05/26/21 0001       Exercises   Exercises Lumbar      Lumbar Exercises: Stretches   Lower Trunk Rotation Limitations x 10 bilat in pain free range      Lumbar Exercises: Standing   Other Standing Lumbar Exercises hip extension x 10 bilat      Lumbar Exercises: Seated   Other Seated Lumbar Exercises seated postural correction x 5 reps      Lumbar Exercises: Sidelying   Clam 10 reps    Hip Abduction 10 reps                     PT Education - 05/26/21 1049     Education Details PT POC and goals, HEP    Person(s) Educated Patient    Methods Explanation;Demonstration;Handout    Comprehension Returned demonstration;Verbalized understanding                 PT Long Term Goals - 05/26/21 1123       PT LONG TERM GOAL #1   Title Pt will be independent with HEP    Time 6    Period Weeks    Status New    Target Date 07/07/21      PT LONG TERM GOAL #2   Title Pt will improve bilat LE strength to 4+/5 to tolerate standing with decreased pain    Time 6    Period Weeks    Status New    Target Date 07/07/21      PT LONG TERM GOAL #3   Title Pt will improve FOTO to >= 48 to demo improved functional mobility  Time 6    Period Weeks    Status New    Target Date 07/07/21      PT LONG TERM GOAL #4   Title Pt will tolerate walking x 3 minutes with pain <= 3/10    Time 6    Period Weeks    Status New    Target Date 07/07/21                    Plan - 05/26/21 1113     Clinical Impression Statement Pt is a 70 y/o male referred for low back pain. Pt presents with decreased  strength, ROM, impaired gait and balance, impaired posture, decreased mobility and activity tolerance and increased pain. Pt will benefit from skilled PT to address deficits and improve functional mobility and independence.    Personal Factors and Comorbidities Age;Comorbidity 3+;Fitness;Time since onset of injury/illness/exacerbation;Past/Current Experience;Transportation    Examination-Activity Limitations Sleep;Transfers;Locomotion Level;Stand;Stairs;Bathing;Bed Mobility    Examination-Participation Restrictions Community Activity;Cleaning;Meal Prep    Stability/Clinical Decision Making Stable/Uncomplicated    Clinical Decision Making Low    Rehab Potential Good    PT Frequency 2x / week    PT Duration 6 weeks    PT Treatment/Interventions Aquatic Therapy;Cryotherapy;Moist Heat;Electrical Stimulation;Iontophoresis 4mg /ml Dexamethasone;Functional mobility training;Gait training;Therapeutic exercise;Balance training;Neuromuscular re-education;Patient/family education;Therapeutic activities;Manual techniques;Taping;Dry needling;Passive range of motion    PT Next Visit Plan assess HEP, improve core strength and mobility    PT Home Exercise Plan [email or web address]    Consulted and Agree with Plan of Care Patient             Patient will benefit from skilled therapeutic intervention in order to improve the following deficits and impairments:  Pain, Postural dysfunction, Impaired flexibility, Decreased strength, Decreased activity tolerance, Decreased range of motion, Hypomobility, Difficulty walking, Decreased mobility, Decreased balance, Decreased endurance  Visit Diagnosis: Chronic midline low back pain without sciatica - Plan: PT plan of care cert/re-cert  Difficulty in walking, not elsewhere classified - Plan: PT plan of care cert/re-cert  Muscle weakness (generalized) - Plan: PT plan of care cert/re-cert  Abnormal posture - Plan: PT plan of care cert/re-cert     Problem List Patient Active  Problem List   Diagnosis Date Noted   Limited mobility 04/20/2021   Chronic pain syndrome 04/20/2021   Peripheral arterial disease (HCC) 02/26/2020   Diabetes mellitus type 2, uncontrolled (HCC) 02/16/2020   Skin necrosis (HCC) 02/15/2020   Methamphetamine abuse (HCC) 01/14/2020   Accidental drug overdose 01/14/2020   Acute respiratory failure with hypoxia and hypercapnia (HCC) 01/14/2020   Hx of CABG 06/19/2019   Callus of foot 02/12/2019   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   Balance disorder 10/10/2017   Vestibular dysfunction 10/04/2017   Lumbar spinal stenosis 08/02/2017   Anxiety and depression 08/02/2017   Insomnia 04/22/2015   COPD, moderate (HCC) 01/16/2015   S/P right total hip arthroplasty 12/03/2013   Hydrocele, right 08/18/2012   Hypertension 08/18/2012   Smoker 08/18/2012   Preventive measure 08/18/2012    Christopher Robertson, PT 05/26/2021, 11:39 AM  [facility] [address] [place], [place], [place] Phone: [phone]   Fax:  [phone]  Name: Christopher Robertson MRN: Franklyn Lor Date of Birth: [birth year]

## 2021-05-29 ENCOUNTER — Other Ambulatory Visit: Payer: Self-pay

## 2021-05-29 ENCOUNTER — Ambulatory Visit (INDEPENDENT_AMBULATORY_CARE_PROVIDER_SITE_OTHER): Payer: Self-pay | Admitting: Pharmacist

## 2021-05-29 DIAGNOSIS — I10 Essential (primary) hypertension: Secondary | ICD-10-CM

## 2021-05-29 DIAGNOSIS — E1165 Type 2 diabetes mellitus with hyperglycemia: Secondary | ICD-10-CM

## 2021-05-29 DIAGNOSIS — J449 Chronic obstructive pulmonary disease, unspecified: Secondary | ICD-10-CM

## 2021-05-29 DIAGNOSIS — F32A Depression, unspecified: Secondary | ICD-10-CM

## 2021-05-29 DIAGNOSIS — F419 Anxiety disorder, unspecified: Secondary | ICD-10-CM
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
## 2021-05-29 NOTE — Patient Instructions (Signed)
Visit Information   PATIENT GOALS:   Goals Addressed             This Visit's Progress    Medication Management       Patient Goals/Self-Care Activities Over the next 90 days, patient will:  take medications as prescribed  Follow Up Plan: Telephone follow up appointment with care management team member scheduled for:   3 months        Consent to CCM Services: Mr. Crewe was given information about Chronic Care Management services including:  CCM service includes personalized support from designated clinical staff supervised by his physician, including individualized plan of care and coordination with other care providers 24/7 contact phone numbers for assistance for urgent and routine care needs. Service will only be billed when office clinical staff spend 20 minutes or more in a month to coordinate care. Only one practitioner may furnish and bill the service in a calendar month. The patient may stop CCM services at any time (effective at the end of the month) by phone call to the office staff. The patient will be responsible for cost sharing (co-pay) of up to 20% of the service fee (after annual deductible is met).  Patient agreed to services and verbal consent obtained.   Patient verbalizes understanding of instructions provided today and agrees to view in [place].   Telephone follow up appointment with care management team member scheduled for: 3 months  Darius Bump   CLINICAL CARE PLAN: Patient Care Plan: Medication Management     Problem Identified: HTN, COPD, DM, Depression      Long-Range Goal: Disease Progression Prevention   Start Date: 04/13/2021  Recent Progress: On track  Priority: High  Note:   Current Barriers:  Unable to achieve control of chronic conditions   Pharmacist Clinical Goal(s):  Over the next 90 days, patient will adhere to plan to optimize therapeutic regimen for diabetes, HTN, COPD, and depression as evidenced by report of  adherence to recommended medication management changes through collaboration with PharmD and provider.   Interventions: 1:1 collaboration with Silverio Decamp, MD regarding development and update of comprehensive plan of care as evidenced by provider attestation and co-signature Inter-disciplinary care team collaboration (see longitudinal plan of care) Comprehensive medication review performed; medication list updated in electronic medical record  Diabetes:  Uncontrolled yet improving; current treatment: Synjardy 25-1055m daily; a1c 10.8 down to 7.5!  Current glucose readings: not currently checking  Denies hypoglycemic/hyperglycemic symptoms  Current meal patterns:to be discussed at future visits  Current exercise: limited by pain  Recommended continue current regimen, repeat a1c in 3 months (due ~ Nov 2022), if still not at goal consider initiation of rybelsus at future visits,  Hypertension:  Controlled; current treatment: amlodipine 163mdaily, carvedilol 6.2520mID;   Previously discussed current home readings: 140-150/90-100 "when stressed, probably better when relaxed"   Denies hypotensive/hypertensive symptoms  Educated on goal BP, proper technique to check BP at home Recommended continue current regimen Chronic Obstructive Pulmonary Disease:  Controlled (per patient); current treatment:albuterol and duonebs ;   1-2 exacerbations requiring treatment in the last 6 months   Previously recommended consider maintenance inhaler, per PCP. Inquired with patient who was unable to provide history of previous inhaler history. If cost was previous issue, can review & initiate patient assistance program with pharmacist at future visits. ,  Depression/Anxiety/SI:  Controlled current treatment: cymbalta 41m53mily;   Needs connected to mental health support, ongoing discussions  Recommended patient dial [phone]  for immediate mental health needs, continue current regimen  Patient  Goals/Self-Care Activities Over the next 90 days, patient will:  take medications as prescribed  Follow Up Plan: Telephone follow up appointment with care management team member scheduled for:   3 months

## 2021-06-01 ENCOUNTER — Ambulatory Visit (INDEPENDENT_AMBULATORY_CARE_PROVIDER_SITE_OTHER): Payer: Medicare HMO | Admitting: Sports Medicine

## 2021-06-01 DIAGNOSIS — F32A Depression, unspecified: Secondary | ICD-10-CM | POA: Diagnosis not present

## 2021-06-01 DIAGNOSIS — F419 Anxiety disorder, unspecified: Secondary | ICD-10-CM | POA: Diagnosis not present

## 2021-06-01 MED ORDER — DULOXETINE HCL 60 MG PO CPEP: 60.0000 mg | ORAL_CAPSULE | Freq: Every day | ORAL | 3 refills | Status: DC

## 2021-06-01 NOTE — Progress Notes (Signed)
    Procedures performed today:    None.  Independent interpretation of notes and tests performed by another provider:   None.  Brief History, Exam, Impression, and Recommendations:    Anxiety and depression Christopher Robertson has noted some improvements with the addition of Cymbalta, still has significant anxiety, increasing to 60 mg daily. He has also gotten into pain management and they are currently using a Suboxone derivative. He complains of some tiredness, I have recommended he do his gabapentin only at night and potentially even stop it to see if this helps his fatigue. Ultimately I think he is out of shape and needs to do more physical therapy.  Chronic process not at goal with pharmacologic intervention.  ___________________________________________ Christopher Robertson. Benjamin Stain, M.D., ABFM., CAQSM. Primary Care and Sports Medicine [facility]  Adjunct Instructor of Family Medicine  [employer]

## 2021-06-01 NOTE — Assessment & Plan Note (Signed)
Christopher Robertson has noted some improvements with the addition of Cymbalta, still has significant anxiety, increasing to 60 mg daily. He has also gotten into pain management and they are currently using a Suboxone derivative. He complains of some tiredness, I have recommended he do his gabapentin only at night and potentially even stop it to see if this helps his fatigue. Ultimately I think he is out of shape and needs to do more physical therapy.

## 2021-06-03 ENCOUNTER — Encounter: Payer: Medicare HMO | Admitting: Physical Therapy

## 2021-06-05 ENCOUNTER — Encounter: Payer: Medicare HMO | Admitting: Physical Therapy

## 2021-06-10 ENCOUNTER — Encounter: Payer: Medicare HMO | Admitting: Physical Therapy

## 2021-06-12 ENCOUNTER — Encounter: Payer: Self-pay | Admitting: Physical Therapy

## 2021-06-12 ENCOUNTER — Other Ambulatory Visit: Payer: Self-pay

## 2021-06-12 ENCOUNTER — Ambulatory Visit (INDEPENDENT_AMBULATORY_CARE_PROVIDER_SITE_OTHER): Payer: Medicare HMO | Admitting: Physical Therapy

## 2021-06-12 DIAGNOSIS — M6281 Muscle weakness (generalized): Secondary | ICD-10-CM | POA: Diagnosis not present

## 2021-06-12 DIAGNOSIS — M545 Low back pain, unspecified: Secondary | ICD-10-CM | POA: Diagnosis not present

## 2021-06-12 DIAGNOSIS — G8929 Other chronic pain: Secondary | ICD-10-CM

## 2021-06-12 DIAGNOSIS — R293 Abnormal posture: Secondary | ICD-10-CM

## 2021-06-12 DIAGNOSIS — R262 Difficulty in walking, not elsewhere classified: Secondary | ICD-10-CM | POA: Diagnosis not present
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
## 2021-06-17 ENCOUNTER — Ambulatory Visit: Payer: Medicare HMO | Admitting: Physical Therapy

## 2021-06-17 ENCOUNTER — Other Ambulatory Visit: Payer: Self-pay

## 2021-06-17 ENCOUNTER — Encounter: Payer: Self-pay | Admitting: Physical Therapy

## 2021-06-17 DIAGNOSIS — M545 Low back pain, unspecified: Secondary | ICD-10-CM

## 2021-06-17 DIAGNOSIS — M6281 Muscle weakness (generalized): Secondary | ICD-10-CM

## 2021-06-17 DIAGNOSIS — R262 Difficulty in walking, not elsewhere classified: Secondary | ICD-10-CM | POA: Diagnosis not present

## 2021-06-17 DIAGNOSIS — G8929 Other chronic pain: Secondary | ICD-10-CM

## 2021-06-17 DIAGNOSIS — R293 Abnormal posture: Secondary | ICD-10-CM

## 2021-06-17 NOTE — Therapy (Signed)
[facility] [place] [address] [place], [place], [address] Phone: [phone]   Fax:  [phone]  Physical Therapy Treatment  Patient Details  Name: Christopher Robertson MRN: [id number] Date of Birth: [birth year] Referring Provider (PT): Ardell Isaacs   Encounter Date: 06/17/2021   PT End of Session - 06/17/21 1353     Visit Number 3    Number of Visits 12    Date for PT Re-Evaluation 07/07/21    Authorization Type [employer]    Authorization - Visit Number 3    Progress Note Due on Visit 10    PT Start Time 1349    PT Stop Time 1433    PT Time Calculation (min) 44 min    Activity Tolerance Patient tolerated treatment well    Behavior During Therapy [facility] for tasks assessed/performed             Past Medical History:  Diagnosis Date   COPD (chronic obstructive pulmonary disease) (HCC)    History of concussion    yrs ago-- no residual   Hypertension    OA (osteoarthritis)    Right hydrocele     Past Surgical History:  Procedure Laterality Date   FEMUR FRACTURE SURGERY  1985   HYDROCELE EXCISION Right 05/23/2015   Procedure: RIGHT HYDROCELECTOMY ADULT;  Surgeon: Ihor Gully, MD;  Location: [facility];  Service: Urology;  Laterality: Right;   KNEE SURGERY Bilateral x5 last one 2003   TOTAL HIP ARTHROPLASTY Right 02-28-2015    There were no vitals filed for this visit.   Subjective Assessment - 06/17/21 1354     Subjective "I'm sore".  Pt reports continued pain in low back.  He can't figure out if he over-did it or slept wrong.    Patient Stated Goals decrease pain during standing and gait    Currently in Pain? Yes    Pain Score 9     Pain Location Back    Pain Orientation Left    Pain Descriptors / Indicators Tightness;Sharp    Aggravating Factors  standing, walking    Pain Relieving Factors medication, rest.                [facility] PT Assessment - 06/17/21 0001       Assessment    Medical Diagnosis low back pain, lumbosacral spondylosis    Referring Provider (PT) Ardell Isaacs    Onset Date/Surgical Date 05/22/19    Next MD Visit none scheduled    Prior Therapy 06/08/21              [facility] Adult PT Treatment/Exercise - 06/17/21 0001       Lumbar Exercises: Stretches   Quad Stretch Left;20 seconds;Right;3 reps   prone, with strap   Other Lumbar Stretch Exercise prone position for hip flexor stretch x 3 min      Lumbar Exercises: Aerobic   Nustep L4: legs only x 4 min for warm up      Lumbar Exercises: Supine   Clam 5 reps;3 seconds   each leg, with green band   Bridge 5 reps   limited clearance   Bridge Limitations cues to engage core    Other Supine Lumbar Exercises gentle arm/leg reach (opp arm/leg) in hookliyng x 5 each side with core engaged during transition.      Modalities   Modalities Electrical Stimulation;Moist Heat      Moist Heat Therapy   Number Minutes Moist Heat 10 Minutes  Moist Heat Location Lumbar Spine      Electrical Stimulation   Electrical Stimulation Location Lt lower lumbar and superior glute    Electrical Stimulation Action TENS - normal mode    Electrical Stimulation Parameters 10 min, intensity to tolerance    Electrical Stimulation Goals Pain              PT Long Term Goals - 05/26/21 1123       PT LONG TERM GOAL #1   Title Pt will be independent with HEP    Time 6    Period Weeks    Status New    Target Date 07/07/21      PT LONG TERM GOAL #2   Title Pt will improve bilat LE strength to 4+/5 to tolerate standing with decreased pain    Time 6    Period Weeks    Status New    Target Date 07/07/21      PT LONG TERM GOAL #3   Title Pt will improve FOTO to >= 48 to demo improved functional mobility    Time 6    Period Weeks    Status New    Target Date 07/07/21      PT LONG TERM GOAL #4   Title Pt will tolerate walking x 3 minutes with pain <= 3/10    Time 6    Period Weeks    Status New     Target Date 07/07/21                   Plan - 06/17/21 1406     Clinical Impression Statement Pt reported reduction of pain from 9 to 3/10 with prone position for 3 min . He is observed moving at a faster pace throughout clinic despite elevated pain level upon arrival.  Encouraged pt to trial prone position (for hip flexor stretch ) on bed on a daily basis.  Progressing towards goals.    Personal Factors and Comorbidities Age;Comorbidity 3+;Fitness;Time since onset of injury/illness/exacerbation;Past/Current Experience;Transportation    Rehab Potential Good    PT Frequency 2x / week    PT Duration 6 weeks    PT Treatment/Interventions Aquatic Therapy;Cryotherapy;Moist Heat;Electrical Stimulation;Iontophoresis 4mg /ml Dexamethasone;Functional mobility training;Gait training;Therapeutic exercise;Balance training;Neuromuscular re-education;Patient/family education;Therapeutic activities;Manual techniques;Taping;Dry needling;Passive range of motion    PT Next Visit Plan Progress HEP as tolerated.  continue lengthening of hip flexors and strengthening of core.    PT Home Exercise Plan [email or web address]    Consulted and Agree with Plan of Care Patient             Patient will benefit from skilled therapeutic intervention in order to improve the following deficits and impairments:  Pain, Postural dysfunction, Impaired flexibility, Decreased strength, Decreased activity tolerance, Decreased range of motion, Hypomobility, Difficulty walking, Decreased mobility, Decreased balance, Decreased endurance  Visit Diagnosis: Chronic midline low back pain without sciatica  Difficulty in walking, not elsewhere classified  Muscle weakness (generalized)  Abnormal posture     Problem List Patient Active Problem List   Diagnosis Date Noted   Limited mobility 04/20/2021   Chronic pain syndrome 04/20/2021   Peripheral arterial disease (HCC) 02/26/2020   Diabetes mellitus type 2, uncontrolled  02/16/2020   Skin necrosis (HCC) 02/15/2020   Methamphetamine abuse (HCC) 01/14/2020   Accidental drug overdose 01/14/2020   Acute respiratory failure with hypoxia and hypercapnia (HCC) 01/14/2020   Hx of CABG 06/19/2019   Callus of foot 02/12/2019   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  Balance disorder 10/10/2017   Vestibular dysfunction 10/04/2017   Lumbar spinal stenosis 08/02/2017   Anxiety and depression 08/02/2017   Insomnia 04/22/2015   COPD, moderate (HCC) 01/16/2015   S/P right total hip arthroplasty 12/03/2013   Hydrocele, right 08/18/2012   Hypertension 08/18/2012   Smoker 08/18/2012   Preventive measure 08/18/2012   Mayer Camel, PTA 06/17/21 2:30 PM  [facility] [place] [address] [place], [place], [address] Phone: [phone]   Fax:  [phone]  Name: Christopher Robertson MRN: [id number] Date of Birth: 1951-04-02

## 2021-06-19 ENCOUNTER — Ambulatory Visit (INDEPENDENT_AMBULATORY_CARE_PROVIDER_SITE_OTHER): Payer: Medicare HMO | Admitting: Physical Therapy

## 2021-06-19 ENCOUNTER — Other Ambulatory Visit: Payer: Self-pay

## 2021-06-19 DIAGNOSIS — R262 Difficulty in walking, not elsewhere classified: Secondary | ICD-10-CM

## 2021-06-19 DIAGNOSIS — R293 Abnormal posture: Secondary | ICD-10-CM

## 2021-06-19 DIAGNOSIS — M6281 Muscle weakness (generalized): Secondary | ICD-10-CM | POA: Diagnosis not present

## 2021-06-19 DIAGNOSIS — M545 Low back pain, unspecified: Secondary | ICD-10-CM | POA: Diagnosis not present

## 2021-06-19 DIAGNOSIS — G8929 Other chronic pain: Secondary | ICD-10-CM

## 2021-06-19 NOTE — Patient Instructions (Signed)
Access Code: [email or web address] URL: [email or web address] Date: 06/19/2021 Prepared by: Reggy Eye  Exercises Lying Prone with 1 Pillow - 1 x daily - 7 x weekly - 3 minutes hold Supine Lower Trunk Rotation - 2 x daily - 7 x weekly - 1 sets - 10 reps Clamshell - 1 x daily - 7 x weekly - 2 sets - 10 reps Seated Sidebending Arms Overhead - 2 x daily - 7 x weekly - 1 sets - 3 reps - 10-15 seconds hold W's - this is a stick up position - 2 x daily - 7 x weekly - 1 sets - 5-10 reps - 5 seconds hold Standing Neutral Spine at Wall - 2 x daily - 7 x weekly Supine Transversus Abdominis Bracing with Heel Slide - 1 x daily - 7 x weekly - 2 sets - 5 reps

## 2021-06-19 NOTE — Therapy (Signed)
[facility] [place] [address] [place], [place], [address] Phone: [phone]   Fax:  [phone]  Physical Therapy Treatment  Patient Details  Name: Christopher Robertson MRN: [id number] Date of Birth: September 19, 1950 Referring Provider (PT): Ardell Isaacs   Encounter Date: 06/19/2021   PT End of Session - 06/19/21 1358     Visit Number 4    Number of Visits 12    Date for PT Re-Evaluation 07/07/21    Authorization Type [employer]    Authorization - Visit Number 4    Progress Note Due on Visit 10    PT Start Time 1315    PT Stop Time 1400    PT Time Calculation (min) 45 min    Activity Tolerance Patient tolerated treatment well    Behavior During Therapy [facility] for tasks assessed/performed             Past Medical History:  Diagnosis Date   COPD (chronic obstructive pulmonary disease) (HCC)    History of concussion    yrs ago-- no residual   Hypertension    OA (osteoarthritis)    Right hydrocele     Past Surgical History:  Procedure Laterality Date   FEMUR FRACTURE SURGERY  1985   HYDROCELE EXCISION Right 05/23/2015   Procedure: RIGHT HYDROCELECTOMY ADULT;  Surgeon: Ihor Gully, MD;  Location: [facility] [place];  Service: Urology;  Laterality: Right;   KNEE SURGERY Bilateral x5 last one 2003   TOTAL HIP ARTHROPLASTY Right 02-28-2015    There were no vitals filed for this visit.   Subjective Assessment - 06/19/21 1316     Subjective Pt reports "I think I'm standing more upright" He states he feels "a little better"    Patient Stated Goals decrease pain during standing and gait    Currently in Pain? Yes    Pain Score 4     Pain Location Back    Pain Orientation Left    Pain Descriptors / Indicators Aching                [facility] PT Assessment - 06/19/21 0001       Assessment   Medical Diagnosis low back pain, lumbosacral spondylosis    Referring Provider (PT) Ardell Isaacs    Onset  Date/Surgical Date 05/22/19    Next MD Visit none scheduled      AROM   Lumbar Extension unable to come to neutral due to pain      Strength   Right Hip Extension 3-/5    Left Hip Extension 3-/5                           [facility] Adult PT Treatment/Exercise - 06/19/21 0001       Lumbar Exercises: Stretches   Standing Extension 2 reps    Standing Extension Limitations attempting to reach neutral    Quad Stretch Right;Left;2 reps;20 seconds      Lumbar Exercises: Aerobic   Nustep L5 x 4 min for warm up      Lumbar Exercises: Supine   Clam 15 reps    Clam Limitations green TB    Heel Slides 5 reps    Heel Slides Limitations with core engaged and cuing for breathing    Bridge 5 reps    Bridge Limitations increased pain today despite cues to activate core    Other Supine Lumbar Exercises bilat shoulder flexion with green band with core tight  x 12    Other Supine Lumbar Exercises lower trunk rotation x 10 in tolerable range      Lumbar Exercises: Prone   Other Prone Lumbar Exercises laying prone x 4 min for hip flexor stretch      Moist Heat Therapy   Number Minutes Moist Heat 10 Minutes    Moist Heat Location Lumbar Spine      Electrical Stimulation   Electrical Stimulation Location lumbar spine    Electrical Stimulation Action TENS    Electrical Stimulation Parameters to tolerance    Electrical Stimulation Goals Pain      Manual Therapy   Manual Therapy Soft tissue mobilization    Soft tissue mobilization STM bilat hip flexors and adductors to reduce pain and mm spasticity                     PT Education - 06/19/21 1357     Education Details updated HEP    Person(s) Educated Patient    Methods Explanation;Demonstration;Handout    Comprehension Returned demonstration;Verbalized understanding                 PT Long Term Goals - 05/26/21 1123       PT LONG TERM GOAL #1   Title Pt will be independent with HEP    Time 6     Period Weeks    Status New    Target Date 07/07/21      PT LONG TERM GOAL #2   Title Pt will improve bilat LE strength to 4+/5 to tolerate standing with decreased pain    Time 6    Period Weeks    Status New    Target Date 07/07/21      PT LONG TERM GOAL #3   Title Pt will improve FOTO to >= 48 to demo improved functional mobility    Time 6    Period Weeks    Status New    Target Date 07/07/21      PT LONG TERM GOAL #4   Title Pt will tolerate walking x 3 minutes with pain <= 3/10    Time 6    Period Weeks    Status New    Target Date 07/07/21                   Plan - 06/19/21 1358     Clinical Impression Statement Pt continues with pain with standing trunk or hip extension, able to tolerate prone position x 4 minutes today. Added manual work for hip flexor and adductor tightness to improve posture and decrease pain with good toleranc.e Pt requires cues for core engagement during supine therex    PT Next Visit Plan lengthening hip flexors, core strength    PT Home Exercise Plan [email or web address]    Consulted and Agree with Plan of Care Patient             Patient will benefit from skilled therapeutic intervention in order to improve the following deficits and impairments:     Visit Diagnosis: Chronic midline low back pain without sciatica  Difficulty in walking, not elsewhere classified  Muscle weakness (generalized)  Abnormal posture     Problem List Patient Active Problem List   Diagnosis Date Noted   Limited mobility 04/20/2021   Chronic pain syndrome 04/20/2021   Peripheral arterial disease (HCC) 02/26/2020   Diabetes mellitus type 2, uncontrolled 02/16/2020   Skin necrosis (HCC) 02/15/2020   Methamphetamine abuse (HCC)  01/14/2020   Accidental drug overdose 01/14/2020   Acute respiratory failure with hypoxia and hypercapnia (HCC) 01/14/2020   Hx of CABG 06/19/2019   Callus of foot 02/12/2019   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 [place], [place], [address] Phone: [phone]   Fax:  [phone]  Name: Lugene Beougher MRN: [id number] Date of Birth: 09-Nov-1950

## 2021-06-24 ENCOUNTER — Other Ambulatory Visit: Payer: Self-pay

## 2021-06-24 ENCOUNTER — Ambulatory Visit (INDEPENDENT_AMBULATORY_CARE_PROVIDER_SITE_OTHER): Payer: Medicare HMO | Admitting: Physical Therapy

## 2021-06-24 ENCOUNTER — Encounter: Payer: Self-pay | Admitting: Physical Therapy

## 2021-06-24 DIAGNOSIS — R262 Difficulty in walking, not elsewhere classified: Secondary | ICD-10-CM

## 2021-06-24 DIAGNOSIS — R293 Abnormal posture: Secondary | ICD-10-CM | POA: Diagnosis not present

## 2021-06-24 DIAGNOSIS — G8929 Other chronic pain: Secondary | ICD-10-CM

## 2021-06-24 DIAGNOSIS — M545 Low back pain, unspecified: Secondary | ICD-10-CM | POA: Diagnosis not present

## 2021-06-24 DIAGNOSIS — M6281 Muscle weakness (generalized): Secondary | ICD-10-CM

## 2021-06-24 NOTE — Therapy (Signed)
[facility] [place] [address] [place], [place], [address] Phone: [phone]   Fax:  [phone]  Physical Therapy Treatment  Patient Details  Name: Christopher Robertson MRN: [id number] Date of Birth: 1950-09-16 Referring Provider (PT): Ardell Isaacs   Encounter Date: 06/24/2021   PT End of Session - 06/24/21 1425     Visit Number 5    Number of Visits 12    Date for PT Re-Evaluation 07/07/21    Authorization Type [employer]    Authorization - Visit Number 5    Progress Note Due on Visit 10    PT Start Time 1345    PT Stop Time 1433    PT Time Calculation (min) 48 min    Activity Tolerance Patient limited by pain    Behavior During Therapy [facility] for tasks assessed/performed             Past Medical History:  Diagnosis Date   COPD (chronic obstructive pulmonary disease) (HCC)    History of concussion    yrs ago-- no residual   Hypertension    OA (osteoarthritis)    Right hydrocele     Past Surgical History:  Procedure Laterality Date   FEMUR FRACTURE SURGERY  1985   HYDROCELE EXCISION Right 05/23/2015   Procedure: RIGHT HYDROCELECTOMY ADULT;  Surgeon: Ihor Gully, MD;  Location: [facility];  Service: Urology;  Laterality: Right;   KNEE SURGERY Bilateral x5 last one 2003   TOTAL HIP ARTHROPLASTY Right 02-28-2015    There were no vitals filed for this visit.   Subjective Assessment - 06/24/21 1347     Subjective Pt reports that he lost his balance on his porch and fell down 3 stairs.  "All the progress we were making, I lost yesterday".    Pertinent History Rt hip replacement, Rt femur fracture    Currently in Pain? Yes    Pain Score 7     Pain Location Back    Pain Orientation Left    Pain Descriptors / Indicators Sore;Aching    Aggravating Factors  standing, walking    Pain Relieving Factors rest, medication.                [facility] PT Assessment - 06/24/21 0001       Assessment    Medical Diagnosis low back pain, lumbosacral spondylosis    Referring Provider (PT) Ardell Isaacs    Onset Date/Surgical Date 05/22/19    Next MD Visit none scheduled              [facility] Adult PT Treatment/Exercise - 06/24/21 0001       Lumbar Exercises: Stretches   Passive Hamstring Stretch Left;Right;2 reps;20 seconds   seated, with strap pulling L foot into DF   Lower Trunk Rotation 5 reps;10 seconds    Lower Trunk Rotation Limitations range to tolerance.    Hip Flexor Stretch Left;3 reps;30 seconds   unable to attain seated position, instead - modified Thomas stretch with LLE off edge of low mat, and Rt knee flexed.   Standing Extension 3 reps;5 seconds    Standing Extension Limitations attempting to reach neutral - hands on elevated high/low table.    Other Lumbar Stretch Exercise seated forward flexion with arms on rollator, rolling it forward x 5 reps      Lumbar Exercises: Aerobic   Nustep L5 x 6 min for warm up      Lumbar Exercises: Sidelying   Other Sidelying Lumbar  Exercises Rt sidelying over black soft bolster and Lt arm overhead for Lt QL stretch x 3 min      Lumbar Exercises: Prone   Other Prone Lumbar Exercises laying prone x 5 min for hip flexor stretch, cues to level pelvis out. Including pushing into POE x 5 reps     Moist Heat Therapy   Number Minutes Moist Heat 10 Minutes    Moist Heat Location Lumbar Spine      Electrical Stimulation   Electrical Stimulation Location Lt lower back/glute    Electrical Stimulation Action TENS    Electrical Stimulation Parameters normal mode, 20 min, during exercise, and 10 min at end of session    Electrical Stimulation Goals Pain      Manual Therapy   Manual therapy comments pt very point tender to Lt QL and lumbar erectors                  PT Long Term Goals - 05/26/21 1123       PT LONG TERM GOAL #1   Title Pt will be independent with HEP    Time 6    Period Weeks    Status New    Target Date  07/07/21      PT LONG TERM GOAL #2   Title Pt will improve bilat LE strength to 4+/5 to tolerate standing with decreased pain    Time 6    Period Weeks    Status New    Target Date 07/07/21      PT LONG TERM GOAL #3   Title Pt will improve FOTO to >= 48 to demo improved functional mobility    Time 6    Period Weeks    Status New    Target Date 07/07/21      PT LONG TERM GOAL #4   Title Pt will tolerate walking x 3 minutes with pain <= 3/10    Time 6    Period Weeks    Status New    Target Date 07/07/21                   Plan - 06/24/21 1401     Clinical Impression Statement Pt reporting general soreness from impact from fall yesterday.  He reported decrease of pain in back by 2 points with prone position.  When pt attempts to stand upright, Lt hip and knee remains flexed. He reported reduction of pain while in modified Thomas position on low black mat. Decreased exercise tolerance today vs previous sessions.    Rehab Potential Good    PT Frequency 2x / week    PT Treatment/Interventions Aquatic Therapy;Cryotherapy;Moist Heat;Electrical Stimulation;Iontophoresis 4mg /ml Dexamethasone;Functional mobility training;Gait training;Therapeutic exercise;Balance training;Neuromuscular re-education;Patient/family education;Therapeutic activities;Manual techniques;Taping;Dry needling;Passive range of motion    PT Next Visit Plan lengthening hip flexors, core strength    PT Home Exercise Plan [email or web address]    Consulted and Agree with Plan of Care Patient             Patient will benefit from skilled therapeutic intervention in order to improve the following deficits and impairments:  Pain, Postural dysfunction, Impaired flexibility, Decreased strength, Decreased activity tolerance, Decreased range of motion, Hypomobility, Difficulty walking, Decreased mobility, Decreased balance, Decreased endurance  Visit Diagnosis: Chronic midline low back pain without sciatica  Difficulty in  walking, not elsewhere classified  Muscle weakness (generalized)  Abnormal posture     Problem List Patient Active Problem List   Diagnosis Date Noted  Limited mobility 04/20/2021   Chronic pain syndrome 04/20/2021   Peripheral arterial disease (HCC) 02/26/2020   Diabetes mellitus type 2, uncontrolled 02/16/2020   Skin necrosis (HCC) 02/15/2020   Methamphetamine abuse (HCC) 01/14/2020   Accidental drug overdose 01/14/2020   Acute respiratory failure with hypoxia and hypercapnia (HCC) 01/14/2020   Hx of CABG 06/19/2019   Callus of foot 02/12/2019   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   Balance disorder 10/10/2017   Vestibular dysfunction 10/04/2017   Lumbar spinal stenosis 08/02/2017   Anxiety and depression 08/02/2017   Insomnia 04/22/2015   COPD, moderate (HCC) 01/16/2015   S/P right total hip arthroplasty 12/03/2013   Hydrocele, right 08/18/2012   Hypertension 08/18/2012   Smoker 08/18/2012   Preventive measure 08/18/2012   Mayer Camel, PTA 06/24/21 2:28 PM  [facility] [place] [address] [place], [place], [address] Phone: [phone]   Fax:  [phone]  Name: Christopher Robertson MRN: [id number] Date of Birth: 1951-06-16

## 2021-06-26 ENCOUNTER — Other Ambulatory Visit: Payer: Self-pay

## 2021-06-26 ENCOUNTER — Ambulatory Visit (INDEPENDENT_AMBULATORY_CARE_PROVIDER_SITE_OTHER): Payer: Medicare HMO | Admitting: Physical Therapy

## 2021-06-26 DIAGNOSIS — R262 Difficulty in walking, not elsewhere classified: Secondary | ICD-10-CM

## 2021-06-26 DIAGNOSIS — R293 Abnormal posture: Secondary | ICD-10-CM

## 2021-06-26 DIAGNOSIS — M6281 Muscle weakness (generalized): Secondary | ICD-10-CM | POA: Diagnosis not present

## 2021-06-26 DIAGNOSIS — M545 Low back pain, unspecified: Secondary | ICD-10-CM | POA: Diagnosis not present

## 2021-06-26 DIAGNOSIS — G8929 Other chronic pain: Secondary | ICD-10-CM

## 2021-06-26 NOTE — Therapy (Signed)
[facility] [place] [address] [place], [place], [address] Phone: [phone]   Fax:  [phone]  Physical Therapy Treatment  Patient Details  Name: Christopher Robertson MRN: [id number] Date of Birth: 26-Oct-1950 Referring Provider (PT): Ardell Isaacs   Encounter Date: 06/26/2021   PT End of Session - 06/26/21 1350     Visit Number 6    Number of Visits 12    Date for PT Re-Evaluation 07/07/21    Authorization Type [employer]    Authorization - Visit Number 6    Progress Note Due on Visit 10    PT Start Time 1315    PT Stop Time 1400    PT Time Calculation (min) 45 min    Activity Tolerance Patient tolerated treatment well    Behavior During Therapy [facility] for tasks assessed/performed             Past Medical History:  Diagnosis Date   COPD (chronic obstructive pulmonary disease) (HCC)    History of concussion    yrs ago-- no residual   Hypertension    OA (osteoarthritis)    Right hydrocele     Past Surgical History:  Procedure Laterality Date   FEMUR FRACTURE SURGERY  1985   HYDROCELE EXCISION Right 05/23/2015   Procedure: RIGHT HYDROCELECTOMY ADULT;  Surgeon: Ihor Gully, MD;  Location: [facility] ;  Service: Urology;  Laterality: Right;   KNEE SURGERY Bilateral x5 last one 2003   TOTAL HIP ARTHROPLASTY Right 02-28-2015    There were no vitals filed for this visit.   Subjective Assessment - 06/26/21 1317     Subjective Pt states that he is feeling better since his fall. He still has "pretty bad" low back pain    Patient Stated Goals decrease pain during standing and gait    Currently in Pain? Yes    Pain Score 6     Pain Location Back    Pain Orientation Lower    Pain Descriptors / Indicators Aching;Sore                [facility] PT Assessment - 06/26/21 0001       Assessment   Medical Diagnosis low back pain, lumbosacral spondylosis    Referring Provider (PT) Ardell Isaacs     Onset Date/Surgical Date 05/22/19    Next MD Visit 07/13/21 with Dr. Karie Schwalbe      AROM   Lumbar Flexion [facility]    Lumbar Extension unable to come to neutral      Flexibility   Hamstrings decreased bilat                           [facility] Adult PT Treatment/Exercise - 06/26/21 0001       Lumbar Exercises: Stretches   Passive Hamstring Stretch Right;Left;2 reps;30 seconds    Passive Hamstring Stretch Limitations seated with strap to pull into DF    Lower Trunk Rotation Limitations range to tolerance x 2 minutes    Hip Flexor Stretch Limitations thomas stretch Lt LE off edge of mat x 2 minutes    Standing Extension 5 reps;5 seconds    Standing Extension Limitations attempting to reach neutral      Lumbar Exercises: Aerobic   Nustep L5 x 5 min for warm up    Other Aerobic Exercise laps around clinic after manual work with focus on upright posture and core strength      Lumbar Exercises: Prone  Other Prone Lumbar Exercises laying prone x 8 minutes during manual work    Other Prone Lumbar Exercises prone on elbows x 2 minutes      Moist Heat Therapy   Number Minutes Moist Heat 10 Minutes    Moist Heat Location Lumbar Spine      Electrical Stimulation   Electrical Stimulation Location lumbar    Electrical Stimulation Action TENS    Electrical Stimulation Parameters to tolerance    Electrical Stimulation Goals Pain      Manual Therapy   Manual Therapy Joint mobilization    Joint Mobilization grade 3-4 CPAs and UPAs SIJ to tolerance to improve mobility and decrease pain    Soft tissue mobilization STM bilat hip flexors and lumbar paraspinals                          PT Long Term Goals - 05/26/21 1123       PT LONG TERM GOAL #1   Title Pt will be independent with HEP    Time 6    Period Weeks    Status New    Target Date 07/07/21      PT LONG TERM GOAL #2   Title Pt will improve bilat LE strength to 4+/5 to tolerate standing with decreased pain     Time 6    Period Weeks    Status New    Target Date 07/07/21      PT LONG TERM GOAL #3   Title Pt will improve FOTO to >= 48 to demo improved functional mobility    Time 6    Period Weeks    Status New    Target Date 07/07/21      PT LONG TERM GOAL #4   Title Pt will tolerate walking x 3 minutes with pain <= 3/10    Time 6    Period Weeks    Status New    Target Date 07/07/21                   Plan - 06/26/21 1350     Clinical Impression Statement Pt with good response to SIJ mobs, continues with decreased hip flexor length but is better able to tolerate lying prone and prone on elbows positioning    PT Next Visit Plan lengthening hip flexors, core strength    PT Home Exercise Plan [email or web address]             Patient will benefit from skilled therapeutic intervention in order to improve the following deficits and impairments:     Visit Diagnosis: Difficulty in walking, not elsewhere classified  Chronic midline low back pain without sciatica  Muscle weakness (generalized)  Abnormal posture     Problem List Patient Active Problem List   Diagnosis Date Noted   Limited mobility 04/20/2021   Chronic pain syndrome 04/20/2021   Peripheral arterial disease (HCC) 02/26/2020   Diabetes mellitus type 2, uncontrolled 02/16/2020   Skin necrosis (HCC) 02/15/2020   Methamphetamine abuse (HCC) 01/14/2020   Accidental drug overdose 01/14/2020   Acute respiratory failure with hypoxia and hypercapnia (HCC) 01/14/2020   Hx of CABG 06/19/2019   Callus of foot 02/12/2019   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   Balance disorder 10/10/2017   Vestibular dysfunction 10/04/2017   Lumbar spinal stenosis 08/02/2017   Anxiety and depression 08/02/2017   Insomnia 04/22/2015   COPD, moderate (HCC) 01/16/2015   S/P right total hip arthroplasty 12/03/2013  Hydrocele, right 08/18/2012   Hypertension 08/18/2012   Smoker 08/18/2012   Preventive measure 08/18/2012     Sydna Brodowski, PT 06/26/2021, 1:53 PM  [facility] [address] [place], [place], [address] Phone: [phone]   Fax:  [phone]  Name: Rael Yo MRN: [id number] Date of Birth: February 27, 1951

## 2021-07-01 ENCOUNTER — Other Ambulatory Visit: Payer: Self-pay

## 2021-07-01 ENCOUNTER — Ambulatory Visit (INDEPENDENT_AMBULATORY_CARE_PROVIDER_SITE_OTHER): Payer: Medicare HMO | Admitting: Physical Therapy

## 2021-07-01 ENCOUNTER — Encounter: Payer: Self-pay | Admitting: Physical Therapy

## 2021-07-01 DIAGNOSIS — M545 Low back pain, unspecified: Secondary | ICD-10-CM | POA: Diagnosis not present

## 2021-07-01 DIAGNOSIS — R262 Difficulty in walking, not elsewhere classified: Secondary | ICD-10-CM | POA: Diagnosis not present

## 2021-07-01 DIAGNOSIS — R293 Abnormal posture: Secondary | ICD-10-CM | POA: Diagnosis not present

## 2021-07-01 DIAGNOSIS — M6281 Muscle weakness (generalized): Secondary | ICD-10-CM | POA: Diagnosis not present

## 2021-07-01 DIAGNOSIS — G8929 Other chronic pain: Secondary | ICD-10-CM

## 2021-07-01 NOTE — Therapy (Signed)
[facility] [place] [address] [place], [place], [address] Phone: [phone]   Fax:  [phone]  Physical Therapy Treatment  Patient Details  Name: Christopher Robertson MRN: [id number] Date of Birth: September 01, 1950 Referring Provider (PT): Ardell Isaacs   Encounter Date: 07/01/2021   PT End of Session - 07/01/21 0936     Visit Number 7    Number of Visits 12    Date for PT Re-Evaluation 07/07/21    Authorization Type [employer]    Authorization - Visit Number 7    Progress Note Due on Visit 10    PT Start Time 0932    PT Stop Time 1011    PT Time Calculation (min) 39 min    Equipment Utilized During Treatment Gait belt    Activity Tolerance Patient tolerated treatment well    Behavior During Therapy WFL for tasks assessed/performed             Past Medical History:  Diagnosis Date   COPD (chronic obstructive pulmonary disease) (HCC)    History of concussion    yrs ago-- no residual   Hypertension    OA (osteoarthritis)    Right hydrocele     Past Surgical History:  Procedure Laterality Date   FEMUR FRACTURE SURGERY  1985   HYDROCELE EXCISION Right 05/23/2015   Procedure: RIGHT HYDROCELECTOMY ADULT;  Surgeon: Ihor Gully, MD;  Location: [facility];  Service: Urology;  Laterality: Right;   KNEE SURGERY Bilateral x5 last one 2003   TOTAL HIP ARTHROPLASTY Right 02-28-2015    There were no vitals filed for this visit.   Subjective Assessment - 07/01/21 0940     Subjective Pt reports that he notices that he can sit and stand taller now. He is able to stand for longer periods.  He ordered a TENS unit and it should arrive today.    Pertinent History Rt hip replacement, Rt femur fracture    Currently in Pain? Yes    Pain Score 3     Pain Location Back    Pain Orientation Lower    Pain Descriptors / Indicators Aching    Aggravating Factors  standing, walking    Pain Relieving Factors stretches.  medication                [facility] PT Assessment - 07/01/21 0001       Assessment   Medical Diagnosis low back pain, lumbosacral spondylosis    Referring Provider (PT) Ardell Isaacs    Onset Date/Surgical Date 05/22/19    Next MD Visit 07/13/21 with Dr. Reatha Harps Adult PT Treatment/Exercise - 07/01/21 0001       Ambulation/Gait   Ambulation/Gait Yes    Ambulation/Gait Assistance 6: Modified independent (Device/Increase time)    Ambulation Distance (Feet) 80 Feet   2 trials; seated rest break in between   Assistive device Rollator    Gait Pattern Shuffle;Trunk flexed;Narrow base of support;Step-through pattern;Poor foot clearance - right;Poor foot clearance - left    Ambulation Surface Level;Indoor    Gait velocity decreased    Stairs Yes    Stair Management Technique Two rails;Step to pattern;Forwards;Backwards    Number of Stairs 4    Height of Stairs 4    Gait Comments cues to increase step height to clear feet, and for upright posture      Lumbar Exercises: Stretches   Hip Flexor  Stretch Limitations thomas stretch Lt LE off edge of mat x 2 minutes    Quad Stretch Right;Left;2 reps;30 seconds;60 seconds    Other Lumbar Stretch Exercise prone position for hip flexor stretch x 1.5 min      Lumbar Exercises: Aerobic   Nustep L5 (arms/legs) x 5 min for warm up      Lumbar Exercises: Standing   Other Standing Lumbar Exercises hip ext and hip abdct x 5 reps each leg, with UE support on elevated table.    Other Standing Lumbar Exercises Side stepping L/Rt x 4 ft x 2 reps with UE support on elevated table.      Lumbar Exercises: Seated   Other Seated Lumbar Exercises side stretch with arm up x 1 rep (audible pop in chest)   Other Seated Lumbar Exercises W's x 5 sec x 5 reps (with corageous ball behind back)      Lumbar Exercises: Supine   Heel Slides 5 reps    Heel Slides Limitations with core engaged and cuing for breathing    Bridge 5 reps;5 seconds    pain free                PT Long Term Goals - 07/01/21 1015       PT LONG TERM GOAL #1   Title Pt will be independent with HEP    Time 6    Period Weeks    Status On-going      PT LONG TERM GOAL #2   Title Pt will improve bilat LE strength to 4+/5 to tolerate standing with decreased pain    Time 6    Period Weeks    Status On-going      PT LONG TERM GOAL #3   Title Pt will improve FOTO to >= 48 to demo improved functional mobility    Time 6    Period Weeks    Status On-going      PT LONG TERM GOAL #4   Title Pt will tolerate walking x 3 minutes with pain <= 3/10    Time 6    Period Weeks    Status On-going                   Plan - 07/01/21 0947     Clinical Impression Statement Pt able to complete bridge without pain today.  Exercise tolerance gradually improving as well as pt demonstrating more upright posture with standing exercise.   He is able to tolerate 5 reps of standing exercise prior to needing short seated rest break. Progressing towards LTGs.    Rehab Potential Good    PT Frequency 2x / week    PT Duration 6 weeks    PT Treatment/Interventions Aquatic Therapy;Cryotherapy;Moist Heat;Electrical Stimulation;Iontophoresis 4mg /ml Dexamethasone;Functional mobility training;Gait training;Therapeutic exercise;Balance training;Neuromuscular re-education;Patient/family education;Therapeutic activities;Manual techniques;Taping;Dry needling;Passive range of motion    PT Next Visit Plan lengthening hip flexors, core strength, endurance in standing position/ gait.   PT Home Exercise Plan [email or web address]    Consulted and Agree with Plan of Care Patient             Patient will benefit from skilled therapeutic intervention in order to improve the following deficits and impairments:  Pain, Postural dysfunction, Impaired flexibility, Decreased strength, Decreased activity tolerance, Decreased range of motion, Hypomobility, Difficulty walking, Decreased mobility,  Decreased balance, Decreased endurance  Visit Diagnosis: Difficulty in walking, not elsewhere classified  Chronic midline low back pain without sciatica  Muscle weakness (generalized)  Abnormal posture     Problem List Patient Active Problem List   Diagnosis Date Noted   Limited mobility 04/20/2021   Chronic pain syndrome 04/20/2021   Peripheral arterial disease (HCC) 02/26/2020   Diabetes mellitus type 2, uncontrolled 02/16/2020   Skin necrosis (HCC) 02/15/2020   Methamphetamine abuse (HCC) 01/14/2020   Accidental drug overdose 01/14/2020   Acute respiratory failure with hypoxia and hypercapnia (HCC) 01/14/2020   Hx of CABG 06/19/2019   Callus of foot 02/12/2019   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   Balance disorder 10/10/2017   Vestibular dysfunction 10/04/2017   Lumbar spinal stenosis 08/02/2017   Anxiety and depression 08/02/2017   Insomnia 04/22/2015   COPD, moderate (HCC) 01/16/2015   S/P right total hip arthroplasty 12/03/2013   Hydrocele, right 08/18/2012   Hypertension 08/18/2012   Smoker 08/18/2012   Preventive measure 08/18/2012   Mayer Camel, PTA 07/01/21 11:29 AM   [facility] [place] [address] [place], [place], [address] Phone: [phone]   Fax:  [phone]  Name: Christopher Robertson MRN: [id number] Date of Birth: 1950/12/03

## 2021-07-03 ENCOUNTER — Other Ambulatory Visit: Payer: Self-pay

## 2021-07-03 ENCOUNTER — Ambulatory Visit (INDEPENDENT_AMBULATORY_CARE_PROVIDER_SITE_OTHER): Payer: Medicare HMO | Admitting: Physical Therapy

## 2021-07-03 DIAGNOSIS — R293 Abnormal posture: Secondary | ICD-10-CM | POA: Diagnosis not present

## 2021-07-03 DIAGNOSIS — M6281 Muscle weakness (generalized): Secondary | ICD-10-CM | POA: Diagnosis not present

## 2021-07-03 DIAGNOSIS — M545 Low back pain, unspecified: Secondary | ICD-10-CM | POA: Diagnosis not present

## 2021-07-03 DIAGNOSIS — R262 Difficulty in walking, not elsewhere classified: Secondary | ICD-10-CM

## 2021-07-03 DIAGNOSIS — G8929 Other chronic pain: Secondary | ICD-10-CM

## 2021-07-03 NOTE — Therapy (Signed)
[place], [place], [address] Phone: [phone]   Fax:  [phone]  Physical Therapy Treatment  Patient Details  Name: Christopher Robertson MRN: [id number] Date of Birth: [birth year] Referring Provider (PT): Dian Situ   Encounter Date: 07/03/2021   PT End of Session - 07/03/21 1047     Visit Number 8    Number of Visits 12    Date for PT Re-Evaluation 07/07/21    Authorization Type [employer]    Authorization - Visit Number 8    Progress Note Due on Visit 10    PT Start Time [id number]    PT Stop Time 1052    PT Time Calculation (min) 50 min    Equipment Utilized During Treatment Gait belt    Activity Tolerance Patient tolerated treatment well    Behavior During Therapy WFL for tasks assessed/performed             Past Medical History:  Diagnosis Date   COPD (chronic obstructive pulmonary disease) ([place])    History of concussion    yrs ago-- no residual   Hypertension    OA (osteoarthritis)    Right hydrocele     Past Surgical History:  Procedure Laterality Date   [place] Right 05/23/2015   Procedure: RIGHT HYDROCELECTOMY ADULT;  Surgeon: Kathie Rhodes, MD;  Location: [facility];  Service: Urology;  Laterality: Right;   KNEE SURGERY Bilateral x5 last one 2003   TOTAL HIP ARTHROPLASTY Right 02-28-2015    There were no vitals filed for this visit.   Subjective Assessment - 07/03/21 1032     Subjective Pt reports he felt great last visit and yesterday but that this morning when he woke up the pain and stiffness was back in bilat hips and back    Currently in Pain? Yes    Pain Score 6     Pain Location Back    Pain Orientation Lower    Pain Descriptors / Indicators Tightness;Aching    Pain Type Chronic pain                [facility] PT Assessment - 07/03/21 0001       Assessment   Medical Diagnosis low back pain,  lumbosacral spondylosis    Referring Provider (PT) Dian Situ    Onset Date/Surgical Date 05/22/19    Next MD Visit 07/13/21 with Dr. Ashby Dawes Adult PT Treatment/Exercise - 07/03/21 0001       Lumbar Exercises: Stretches   Lower Trunk Rotation 3 reps;10 seconds    Hip Flexor Stretch Limitations thomas stretch Lt LE off edge of mat x 2 minutes    Quad Stretch Right;Left;2 reps;30 seconds;60 seconds      Lumbar Exercises: Aerobic   Nustep L5 (arms/legs) x 5 min for warm up      Lumbar Exercises: Standing   Other Standing Lumbar Exercises hip ext and hip abdct x 5 reps each leg, with UE support on elevated table.    Other Standing Lumbar Exercises --   unable to tolerate side stepping today due to increased pain     Lumbar Exercises: Seated   Other Seated Lumbar Exercises side stretch with arm up x 3 reps      Lumbar Exercises: Supine  Heel Slides 5 reps    Heel Slides Limitations with core engaged and cuing for breathing    Bridge Limitations unable to tolerate this visit      Lumbar Exercises: Prone   Other Prone Lumbar Exercises laying prone x 8 mins for manual work    Other Prone Lumbar Exercises prone on elbows x 2 min      Moist Heat Therapy   Number Minutes Moist Heat 10 Minutes    Moist Heat Location Lumbar Spine      Electrical Stimulation   Electrical Stimulation Location lumbar    Electrical Stimulation Action TENS    Electrical Stimulation Parameters to tolerance    Electrical Stimulation Goals Pain      Manual Therapy   Manual Therapy Passive ROM    Joint Mobilization grade 2-3 SIJ mobs    Soft tissue mobilization STM Lt lumbar paraspinals to reduce spasticity    Passive ROM PROM bilat hips to improve flexibility and decrease pain                          PT Long Term Goals - 07/01/21 1015       PT LONG TERM GOAL #1   Title Pt will be independent with HEP    Time 6    Period Weeks     Status On-going      PT LONG TERM GOAL #2   Title Pt will improve bilat LE strength to 4+/5 to tolerate standing with decreased pain    Time 6    Period Weeks    Status On-going      PT LONG TERM GOAL #3   Title Pt will improve FOTO to >= 48 to demo improved functional mobility    Time 6    Period Weeks    Status On-going      PT LONG TERM GOAL #4   Title Pt will tolerate walking x 3 minutes with pain <= 3/10    Time 6    Period Weeks    Status On-going                   Plan - 07/03/21 1047     Clinical Impression Statement Pt with increased pain again today. Unable to fully tolerate standing exercises. Session focused on stretching and manual work to reduce tightness and pain    PT Next Visit Plan lengthening hip flexors, core strength, endurance in standing position/ gait.    PT Home Exercise Plan [id number]    Consulted and Agree with Plan of Care Patient             Patient will benefit from skilled therapeutic intervention in order to improve the following deficits and impairments:     Visit Diagnosis: Difficulty in walking, not elsewhere classified  Chronic midline low back pain without sciatica  Muscle weakness (generalized)  Abnormal posture     Problem List Patient Active Problem List   Diagnosis Date Noted   Limited mobility 04/20/2021   Chronic pain syndrome 04/20/2021   Peripheral arterial disease ([place]) 02/26/2020   Diabetes mellitus type 2, uncontrolled 02/16/2020   Skin necrosis ([place]) 02/15/2020   Methamphetamine abuse ([place]) 01/14/2020   Accidental drug overdose 01/14/2020   Acute respiratory failure with hypoxia and hypercapnia ([place]) 01/14/2020   Hx of CABG 06/19/2019   Callus of foot 02/12/2019   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   Balance disorder 10/10/2017   Vestibular dysfunction 10/04/2017  Lumbar spinal stenosis 08/02/2017   Anxiety and depression 08/02/2017   Insomnia 04/22/2015   COPD, moderate (HCC) 01/16/2015    S/P right total hip arthroplasty 12/03/2013   Hydrocele, right 08/18/2012   Hypertension 08/18/2012   Smoker 08/18/2012   Preventive measure 08/18/2012    Christopher Robertson, PT 07/03/2021, 10:49 AM  [facility] [address] [place], [place], [address] Phone: [phone]   Fax:  [phone]  Name: Christopher Robertson MRN: [id number] Date of Birth: 14-Jul-1951

## 2021-07-06 ENCOUNTER — Ambulatory Visit (INDEPENDENT_AMBULATORY_CARE_PROVIDER_SITE_OTHER): Payer: Medicare HMO | Admitting: Physical Therapy

## 2021-07-06 ENCOUNTER — Other Ambulatory Visit: Payer: Self-pay

## 2021-07-06 DIAGNOSIS — G8929 Other chronic pain: Secondary | ICD-10-CM

## 2021-07-06 DIAGNOSIS — R293 Abnormal posture: Secondary | ICD-10-CM

## 2021-07-06 DIAGNOSIS — M545 Low back pain, unspecified: Secondary | ICD-10-CM | POA: Diagnosis not present

## 2021-07-06 DIAGNOSIS — M6281 Muscle weakness (generalized): Secondary | ICD-10-CM | POA: Diagnosis not present

## 2021-07-06 DIAGNOSIS — R262 Difficulty in walking, not elsewhere classified: Secondary | ICD-10-CM

## 2021-07-06 NOTE — Therapy (Addendum)
[place], [place], [address] Phone: [phone]   Fax:  [phone]  Physical Therapy Treatment and Discharge  Patient Details  Name: Christopher Robertson MRN: [id number] Date of Birth: 11/07/1950 Referring Provider (PT): Dian Situ  PHYSICAL THERAPY DISCHARGE SUMMARY  Visits from Start of Care: 9  Current functional level related to goals / functional outcomes: See below. Has not returned to clinic. Chart indicates pt had hospital admission   Remaining deficits: See below   Education / Equipment: See below   Patient agrees to discharge. Patient goals were not met. Patient is being discharged due to a change in medical status.   Encounter Date: 07/06/2021   PT End of Session - 07/06/21 1518     Visit Number 9    Number of Visits 12    Date for PT Re-Evaluation 07/07/21    Authorization Type [employer]    Authorization - Visit Number 9    Progress Note Due on Visit 10    PT Start Time 0093    PT Stop Time 1600    PT Time Calculation (min) 45 min    Equipment Utilized During Treatment Gait belt    Activity Tolerance Patient tolerated treatment well    Behavior During Therapy WFL for tasks assessed/performed             Past Medical History:  Diagnosis Date   COPD (chronic obstructive pulmonary disease) ([place])    History of concussion    yrs ago-- no residual   Hypertension    OA (osteoarthritis)    Right hydrocele     Past Surgical History:  Procedure Laterality Date   FEMUR FRACTURE SURGERY  1985   HYDROCELE EXCISION Right 05/23/2015   Procedure: RIGHT HYDROCELECTOMY ADULT;  Surgeon: Kathie Rhodes, MD;  Location: [facility];  Service: Urology;  Laterality: Right;   KNEE SURGERY Bilateral x5 last one 2003   TOTAL HIP ARTHROPLASTY Right 02-28-2015    There were no vitals filed for this visit.   Subjective Assessment - 07/06/21 1518     Subjective Pt  states he is quite stiff. Notes increased stiffness at night. Pt states he was doing well and things were better controlled a few weeks ago but reports a fall forward that might have set him back.    Pertinent History Rt hip replacement, Rt femur fracture    How long can you stand comfortably? not at all    How long can you walk comfortably? not at all    Diagnostic tests none in past year    Patient Stated Goals decrease pain during standing and gait    Currently in Pain? Yes    Pain Score 4     Pain Location Back    Pain Orientation Lower    Pain Descriptors / Indicators Tightness;Aching                [facility] PT Assessment - 07/06/21 0001       Strength   Right Hip Extension 3-/5   limited due to his decreased hip ROM   Right Hip ABduction 3+/5    Left Hip Extension 3-/5   limited due to his decreased hip ROM   Left Hip ABduction 3+/5                           [facility] Adult PT Treatment/Exercise - 07/06/21 0001  Lumbar Exercises: Stretches   Passive Hamstring Stretch Left;60 seconds    Passive Hamstring Stretch Limitations seated    Hip Flexor Stretch 30 seconds    Hip Flexor Stretch Limitations sidelying with leg off EOB and into hip extension    Other Lumbar Stretch Exercise prone on 1 pillow x5 min   Limited due to sternum pain   Other Lumbar Stretch Exercise sidelying stretch for upslip ilium x2 min      Lumbar Exercises: Aerobic   Nustep L5 (arms/legs) x 5 min for warm up      Lumbar Exercises: Standing   Other Standing Lumbar Exercises hip ext and hip abdct x 5 reps each leg, with UE support on elevated table.      Lumbar Exercises: Supine   Heel Slides 10 reps    Heel Slides Limitations with core engaged and cuing for breathing    Bridge --   2x5     Lumbar Exercises: Sidelying   Other Sidelying Lumbar Exercises hip extension AAROM x10 each side      Manual Therapy   Joint Mobilization grade 2-3 SIJ mobs and long hold PA x2 min     Passive ROM PROM bilat hips to improve flexibility and decrease pain                          PT Long Term Goals - 07/06/21 1521       PT LONG TERM GOAL #1   Title Pt will be independent with HEP    Time 6    Period Weeks    Status On-going      PT LONG TERM GOAL #2   Title Pt will improve bilat LE strength to 4+/5 to tolerate standing with decreased pain    Baseline 3/5 in bilat hips 07/06/21    Time 6    Period Weeks    Status On-going      PT LONG TERM GOAL #3   Title Pt will improve FOTO to >= 48 to demo improved functional mobility    Time 6    Period Weeks    Status On-going      PT LONG TERM GOAL #4   Title Pt will tolerate walking x 3 minutes with pain <= 3/10    Baseline "Lucky if I can get 1 min" 07/06/21    Time 6    Period Weeks    Status On-going    Target Date 08/17/21                   Plan - 07/06/21 1701     Clinical Impression Statement Continued to focus on hip and pelvic stretching, strengthening and mobility. Pt with L>R hamstring tightness and R>L hip flexor tightness. Pt's drop foot is likely a contributing factor to his increased L hip tightness as he utilizes a steppage gait pattern. Pt continues to demo continued hip weakness. Pt would benefit from continued physical therapy to improve his standing and walking tolerance and overall posture and alignment.    Personal Factors and Comorbidities Age;Comorbidity 3+;Fitness;Time since onset of injury/illness/exacerbation;Past/Current Experience;Transportation    Examination-Activity Limitations Sleep;Transfers;Locomotion Level;Stand;Stairs;Bathing;Bed Mobility    Examination-Participation Restrictions Community Activity;Cleaning;Meal Prep    Rehab Potential Good    PT Frequency 2x / week    PT Duration 6 weeks    PT Treatment/Interventions Aquatic Therapy;Cryotherapy;Moist Heat;Electrical Stimulation;Iontophoresis 22m/ml Dexamethasone;Functional mobility training;Gait  training;Therapeutic exercise;Balance training;Neuromuscular re-education;Patient/family education;Therapeutic activities;Manual techniques;Taping;Dry needling;Passive range  of motion    PT Next Visit Plan lengthening hip flexors, core strength, endurance in standing position/ gait.    PT Home Exercise Plan [id number]    Consulted and Agree with Plan of Care Patient             Patient will benefit from skilled therapeutic intervention in order to improve the following deficits and impairments:  Pain, Postural dysfunction, Impaired flexibility, Decreased strength, Decreased activity tolerance, Decreased range of motion, Hypomobility, Difficulty walking, Decreased mobility, Decreased balance, Decreased endurance  Visit Diagnosis: Difficulty in walking, not elsewhere classified  Chronic midline low back pain without sciatica  Muscle weakness (generalized)  Abnormal posture     Problem List Patient Active Problem List   Diagnosis Date Noted   Limited mobility 04/20/2021   Chronic pain syndrome 04/20/2021   Peripheral arterial disease ([place]) 02/26/2020   Diabetes mellitus type 2, uncontrolled 02/16/2020   Skin necrosis ([place]) 02/15/2020   Methamphetamine abuse ([place]) 01/14/2020   Accidental drug overdose 01/14/2020   Acute respiratory failure with hypoxia and hypercapnia ([place]) 01/14/2020   Hx of CABG 06/19/2019   Callus of foot 02/12/2019   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   Balance disorder 10/10/2017   Vestibular dysfunction 10/04/2017   Lumbar spinal stenosis 08/02/2017   Anxiety and depression 08/02/2017   Insomnia 04/22/2015   COPD, moderate ([place]) 01/16/2015   S/P right total hip arthroplasty 12/03/2013   Hydrocele, right 08/18/2012   Hypertension 08/18/2012   Smoker 08/18/2012   Preventive measure 08/18/2012    Ranyia Witting April Ma L Lily Kernen, PT, DPT 07/06/2021, 5:06 PM  [facility] [place] [address] [place], [place], [address] Phone: [phone]   Fax:  [phone]  Name: Anyelo Mccue MRN: [id number] Date of Birth: 11-09-1950

## 2021-07-09 ENCOUNTER — Encounter: Payer: Medicare HMO | Admitting: Physical Therapy

## 2021-07-13 ENCOUNTER — Other Ambulatory Visit: Payer: Self-pay

## 2021-07-13 ENCOUNTER — Ambulatory Visit (INDEPENDENT_AMBULATORY_CARE_PROVIDER_SITE_OTHER): Payer: Medicare HMO | Admitting: Sports Medicine

## 2021-07-13 VITALS — BP 170/102 | HR 99 | Wt 236.0 lb

## 2021-07-13 DIAGNOSIS — I10 Essential (primary) hypertension: Secondary | ICD-10-CM

## 2021-07-13 DIAGNOSIS — Z0000 Encounter for general adult medical examination without abnormal findings: Secondary | ICD-10-CM

## 2021-07-13 DIAGNOSIS — F419 Anxiety disorder, unspecified: Secondary | ICD-10-CM | POA: Diagnosis not present

## 2021-07-13 DIAGNOSIS — E118 Type 2 diabetes mellitus with unspecified complications: Secondary | ICD-10-CM

## 2021-07-13 DIAGNOSIS — Z23 Encounter for immunization: Secondary | ICD-10-CM

## 2021-07-13 DIAGNOSIS — Z1211 Encounter for screening for malignant neoplasm of colon: Secondary | ICD-10-CM

## 2021-07-13 DIAGNOSIS — M48062 Spinal stenosis, lumbar region with neurogenic claudication: Secondary | ICD-10-CM

## 2021-07-13 DIAGNOSIS — F32A Depression, unspecified: Secondary | ICD-10-CM

## 2021-07-13 MED ORDER — DULOXETINE HCL 60 MG PO CPEP: 60.0000 mg | ORAL_CAPSULE | Freq: Every day | ORAL | 3 refills | Status: DC

## 2021-07-13 MED ORDER — SHINGRIX 50 MCG/0.5ML IM SUSR
0.5000 mL | Freq: Once | INTRAMUSCULAR | 0 refills | Status: AC
Start: 2021-07-13 — End: 2021-07-13

## 2021-07-13 NOTE — Progress Notes (Signed)
    Procedures performed today:    None.  Independent interpretation of notes and tests performed by another provider:   None.  Brief History, Exam, Impression, and Recommendations:    Anxiety and depression Improved symptoms with increase to 60 mg of Cymbalta. He also got into a pain management clinic and they are currently using a Suboxone derivative. No further changes here  Hypertension Blood pressure elevated but he has not taken his medication yet today, historically it does run acceptably with current medications.  Lumbar spinal stenosis Christopher Robertson returns, he does have multilevel lumbar spinal stenosis, he is currently working with pain management. He has done well with physical therapy, had a fall with an increase in pain, I have advised to continue with therapy, and he will likely get back to where he was previously.  Annual physical exam Flu shot today, resending Shingrix to the pharmacy.  Controlled diabetes mellitus type 2 with complications (HCC) Christopher Robertson does tell me he had his diabetic eye exam this year.  I spent 30 minutes of total time managing this patient today, this includes chart review, face to face, and non-face to face time.  Specifically discussed screenings.  ___________________________________________ Ihor Austin. Benjamin Stain, M.D., ABFM., CAQSM. Primary Care and Sports Medicine [facility]  Adjunct Instructor of Family Medicine  [employer]

## 2021-07-13 NOTE — Assessment & Plan Note (Signed)
Improved symptoms with increase to 60 mg of Cymbalta. He also got into a pain management clinic and they are currently using a Suboxone derivative. No further changes here

## 2021-07-13 NOTE — Assessment & Plan Note (Signed)
[place] does tell me he had his diabetic eye exam this year.

## 2021-07-13 NOTE — Assessment & Plan Note (Signed)
Blood pressure elevated but he has not taken his medication yet today, historically it does run acceptably with current medications.

## 2021-07-13 NOTE — Assessment & Plan Note (Signed)
Christopher Robertson returns, he does have multilevel lumbar spinal stenosis, he is currently working with pain management. He has done well with physical therapy, had a fall with an increase in pain, I have advised to continue with therapy, and he will likely get back to where he was previously.

## 2021-07-13 NOTE — Assessment & Plan Note (Signed)
Flu shot today, resending Shingrix to the pharmacy.

## 2021-07-25 ENCOUNTER — Other Ambulatory Visit: Payer: Self-pay | Admitting: Sports Medicine

## 2021-07-27 ENCOUNTER — Telehealth: Payer: Self-pay | Admitting: General Practice

## 2021-07-27 NOTE — Telephone Encounter (Signed)
Transition Care Management Follow-up Telephone Call Date of discharge and from where: 07/22/21 from [facility] How have you been since you were released from the hospital? Doing better. CPAP and home oxygen has been ordered and they are working on getting the prior [place]. He is still at the hospital and is hoping to be d/cd today. Any questions or concerns? No  Items Reviewed: Did the pt receive and understand the discharge instructions provided? Yes  Medications obtained and verified? No  Other? No  Any new allergies since your discharge? No  Dietary orders reviewed? Yes Do you have support at home? Yes   Home Care and Equipment/Supplies: Were home health services ordered? no  Functional Questionnaire: (I = Independent and D = Dependent) ADLs: I  Bathing/Dressing- I  Meal Prep- I  Eating- I  Maintaining continence- I  Transferring/Ambulation- I  Managing Meds- I  Follow up appointments reviewed:  PCP Hospital f/u appt confirmed? Yes  Scheduled to see Dr. Karie Schwalbe on 08/03/21 @ 2pm. Specialist [facility] f/u appt confirmed? No   Are transportation arrangements needed? No  If their condition worsens, is the pt aware to call PCP or go to the Emergency Dept.? Yes Was the patient provided with contact information for the PCP's office or ED? Yes Was to pt encouraged to call back with questions or concerns? Yes

## 2021-08-03 ENCOUNTER — Other Ambulatory Visit: Payer: Self-pay

## 2021-08-03 ENCOUNTER — Ambulatory Visit (INDEPENDENT_AMBULATORY_CARE_PROVIDER_SITE_OTHER): Payer: Medicare HMO | Admitting: Sports Medicine

## 2021-08-03 DIAGNOSIS — I10 Essential (primary) hypertension: Secondary | ICD-10-CM

## 2021-08-03 DIAGNOSIS — J449 Chronic obstructive pulmonary disease, unspecified: Secondary | ICD-10-CM

## 2021-08-03 DIAGNOSIS — E118 Type 2 diabetes mellitus with unspecified complications: Secondary | ICD-10-CM

## 2021-08-03 MED ORDER — TRELEGY ELLIPTA 200-62.5-25 MCG/ACT IN AEPB: INHALATION_SPRAY | RESPIRATORY_TRACT | 0 refills | Status: DC

## 2021-08-03 NOTE — Assessment & Plan Note (Signed)
Christopher Robertson returns, he is a pleasant 70 year old male, multiple medical problems including hypercapnic COPD, diabetes, recently hospitalized with hypercapnic respiratory failure, did not respond to BiPAP, AVAPS was successful. He left AMA but the recommendation was outpatient addition of AVAPS, we will go ahead and get him an updated sleep study, I would also like consultation with pulmonology, specifically for assistance in getting home AVAPS. In addition he will continue with Symbicort twice daily.  We do not have any more Symbicort samples so we are going to give him some samples of Trelegy high-dose.

## 2021-08-03 NOTE — Progress Notes (Signed)
    Procedures performed today:    None.  Independent interpretation of notes and tests performed by another provider:   None.  Brief History, Exam, Impression, and Recommendations:    Hypercapnic chronic obstructive pulmonary disease Christopher Robertson returns, he is a pleasant 70 year old male, multiple medical problems including hypercapnic COPD, diabetes, recently hospitalized with hypercapnic respiratory failure, did not respond to BiPAP, AVAPS was successful. He left AMA but the recommendation was outpatient addition of AVAPS, we will go ahead and get him an updated sleep study, I would also like consultation with pulmonology, specifically for assistance in getting home AVAPS. In addition he will continue with Symbicort twice daily.  We do not have any more Symbicort samples so we are going to give him some samples of Trelegy high-dose.  Benign essential hypertension Blood pressures have historically run acceptably on home medications.   Controlled diabetes mellitus type 2 with complications (HCC) Continue Synjardy, we do need to recheck his A1c, due to the complexity of today's visit we will defer this to the next visit.    ___________________________________________ Ihor Austin. Benjamin Stain, M.D., ABFM., CAQSM. Primary Care and Sports Medicine [facility]  Adjunct Instructor of Family Medicine  [employer]

## 2021-08-03 NOTE — Assessment & Plan Note (Signed)
Continue Synjardy, we do need to recheck his A1c, due to the complexity of today's visit we will defer this to the next visit.

## 2021-08-03 NOTE — Assessment & Plan Note (Signed)
Blood pressures have historically run acceptably on home medications.

## 2021-08-14 ENCOUNTER — Other Ambulatory Visit: Payer: Self-pay

## 2021-08-14 ENCOUNTER — Other Ambulatory Visit: Payer: Self-pay | Admitting: Sports Medicine

## 2021-08-14 ENCOUNTER — Ambulatory Visit (INDEPENDENT_AMBULATORY_CARE_PROVIDER_SITE_OTHER): Payer: Medicare HMO | Admitting: Pharmacist

## 2021-08-14 DIAGNOSIS — F419 Anxiety disorder, unspecified: Secondary | ICD-10-CM

## 2021-08-14 DIAGNOSIS — E1165 Type 2 diabetes mellitus with hyperglycemia: Secondary | ICD-10-CM

## 2021-08-14 DIAGNOSIS — I10 Essential (primary) hypertension: Secondary | ICD-10-CM

## 2021-08-14 DIAGNOSIS — J449 Chronic obstructive pulmonary disease, unspecified: Secondary | ICD-10-CM

## 2021-08-14 DIAGNOSIS — E118 Type 2 diabetes mellitus with unspecified complications: Secondary | ICD-10-CM

## 2021-08-14 MED ORDER — AMBULATORY NON FORMULARY MEDICATION: 0 refills | Status: AC

## 2021-08-14 NOTE — Patient Instructions (Signed)
Visit Information  Thank you for taking time to visit with me today. Please don't hesitate to contact me if I can be of assistance to you before our next scheduled telephone appointment.  Following are the goals we discussed today:   Patient Goals/Self-Care Activities Over the next 30 days, patient will:  take medications as prescribed  Follow Up Plan: Telephone follow up appointment with care management team member scheduled for:  1 month  Please call the care guide team at [phone] if you need to cancel or reschedule your appointment.   If you are experiencing a Mental Health or Behavioral Health Crisis or need someone to talk to, please call the Suicide and Crisis Lifeline: 988 call 1-800-273-TALK (toll free, 24 hour hotline)   Patient verbalizes understanding of instructions provided today and agrees to view in MyChart.   Christopher Robertson Christopher Robertson  

## 2021-08-14 NOTE — Progress Notes (Signed)
Chronic Care Management Pharmacy Note  08/14/2021 Name:  Christopher Robertson MRN:  [id number] DOB:  March 31, 1951  Summary: addressed DM, HTN, mental health.   -Has a PCP visit scheduled for 09/01/20 for recheck A1c & COPD f/u.  - Begins with lung specialist Dec 21st - Now uses pill box with AM, noon, Afternoon, bedtime and states his adherence/organization has improved drastically  - After hospitalization was set up for a meal delivery service which is low-carb and portioned out, he is enjoying this - A1c 9.3, checked during 07/23/21 hospitalization  Recommendations/Changes made from today's visit: doing well, A1c no yet controlled. If still not at goal at next check may consider rybelsus in future, happy to assist w/cost if needed.   Patient requested a way to check sugars at home, will collaborate with PCP to send RX for glucometer & supplies. Patient states he wishes to try traditional fingersticks first & if they are bothersome, he will try CGM next. Can provide sample if/when desired.  Plan: f/u with pharmacist in 1 month  Subjective: Christopher Robertson is an 70 y.o. year old male who is a primary patient of Thekkekandam, Gwen Her, MD.  The CCM team was consulted for assistance with disease management and care coordination needs.    Engaged with patient by telephone for follow up visit in response to provider referral for pharmacy case management and/or care coordination services.   Consent to Services:  The patient was given information about Chronic Care Management services, agreed to services, and gave verbal consent prior to initiation of services.  Please see initial visit note for detailed documentation.   Patient Care Team: Silverio Decamp, MD as PCP - General (Family Medicine) Darius Bump, [facility] as Pharmacist (Pharmacist)  Objective:  Lab Results  Component Value Date   CREATININE 0.94 12/24/2020   CREATININE 1.23 02/15/2020   CREATININE 1.15  11/03/2018    Lab Results  Component Value Date   HGBA1C 7.5 (A) 04/20/2021       Component Value Date/Time   CHOL 143 12/24/2020 0000   TRIG 145 12/24/2020 0000   HDL 40 12/24/2020 0000   CHOLHDL 3.6 12/24/2020 0000   VLDL 59 (H) 08/21/2012 0955   LDLCALC 78 12/24/2020 0000    Hepatic Function Latest Ref Rng & Units 12/24/2020 02/15/2020 11/03/2018  Total Protein 6.1 - 8.1 g/dL 7.2 6.6 7.2  Albumin 3.5 - 5.2 g/dL - - -  AST 10 - 35 U/L _0 ALT 9 - 46 U/L _1 Alk Phosphatase 39 - 117 U/L - - -  Total Bilirubin 0.2 - 1.2 mg/dL 0.5 0.4 0.7    Lab Results  Component Value Date/Time   TSH 1.81 12/24/2020 12:00 AM   TSH 2.11 02/15/2020 02:48 PM    CBC Latest Ref Rng & Units 12/24/2020 02/15/2020 11/03/2018  WBC 3.8 - 10.8 Thousand/uL 8.2 9.6 8.6  Hemoglobin 13.2 - 17.1 g/dL 16.9 15.6 17.1  Hematocrit 38.5 - 50.0 % 50.5(H) 47.2 49.3  Platelets 140 - 400 Thousand/uL 342 329 338    No results found for: VD25OH  Clinical ASCVD: Yes  The ASCVD Risk score (Arnett DK, et al., 2019) failed to calculate for the following reasons:   The patient has a prior MI or stroke diagnosis     Social History   Tobacco Use  Smoking Status Every Day   Packs/day: 0.50   Years: 40.00   Pack years: 20.00   Types: Cigarettes  Smokeless Tobacco Former   Types: Chew   BP Readings from Last 3 Encounters:  08/03/21 103/61  07/13/21 (!) 170/102  06/01/21 130/81   Pulse Readings from Last 3 Encounters:  08/03/21 80  07/13/21 99  06/01/21 86   Wt Readings from Last 3 Encounters:  08/03/21 242 lb 0.6 oz (109.8 kg)  07/13/21 236 lb (107 kg)  06/01/21 236 lb 0.6 oz (107.1 kg)    Assessment: Review of patient past medical history, allergies, medications, health status, including review of consultants reports, laboratory and other test data, was performed as part of comprehensive evaluation and provision of chronic care management services.   SDOH:  (Social Determinants of Health)  assessments and interventions performed:    CCM Care Plan  No Known Allergies  Medications Reviewed Today     Reviewed by Gust Brooms, [place] (Certified Medical Assistant) on 08/03/21 at 1412  Med List Status: <None>   Medication Order Taking? Sig Documenting Provider Last Dose Status Informant  AMBULATORY NON FORMULARY MEDICATION [id number] Yes Motorized scooter with tiller-style handlebars. Silverio Decamp, MD Taking Active   amLODipine (NORVASC) 10 MG tablet [id number] Yes TAKE 1 TABLET EVERY DAY Silverio Decamp, MD Taking Active   atorvastatin (LIPITOR) 80 MG tablet [id number] Yes Take 1 tablet (80 mg total) by mouth daily at 6 PM. Silverio Decamp, MD Taking Active   carvedilol (COREG) 6.25 MG tablet [id number] Yes Take 1 tablet (6.25 mg total) by mouth 2 (two) times daily with a meal. Silverio Decamp, MD Taking Active   cilostazol (PLETAL) 50 MG tablet [id number] Yes Take 1 tablet (50 mg total) by mouth 2 (two) times daily. Silverio Decamp, MD Taking Active   DULoxetine (CYMBALTA) 60 MG capsule [id number] Yes Take 1 capsule (60 mg total) by mouth daily. Silverio Decamp, MD Taking Active   Empagliflozin-metFORMIN HCl ER (SYNJARDY XR) 25-1000 MG TB24 [id number] Yes Take 1 tablet by mouth daily. Silverio Decamp, MD Taking Active   gabapentin (NEURONTIN) 300 MG capsule [id number] Yes Take 1 capsule (300 mg total) by mouth 3 (three) times daily. Silverio Decamp, MD Taking Active   ipratropium-albuterol (DUONEB) 0.5-2.5 (3) MG/3ML Bailey Mech [id number] Yes USE 1 VIAL VIA NEBULIZER  EVERY 6 HOURS AS NEEDED Silverio Decamp, MD Taking Active   lisinopril (ZESTRIL) 20 MG tablet [id number] Yes Take by mouth. [provider] Taking Active   predniSONE (DELTASONE) 10 MG tablet [id number] Yes As of 07/31/2021, take 2 tablets (20 mg) daily x2 days, followed by 1 tablet (10 mg) daily x3 days. [provider] Taking Active              Patient Active Problem List   Diagnosis Date Noted   Limited mobility 04/20/2021   Chronic pain syndrome 04/20/2021   Peripheral arterial disease ([place]) 02/26/2020   Controlled diabetes mellitus type 2 with complications ([place]) 86/48/4720   Skin necrosis ([place]) 02/15/2020   Methamphetamine abuse ([place]) 01/14/2020   Accidental drug overdose 01/14/2020   Hx of CABG 06/19/2019   Callus of foot 02/12/2019   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   Balance disorder 10/10/2017   Vestibular dysfunction 10/04/2017   Lumbar spinal stenosis 08/02/2017   Anxiety and depression 08/02/2017   Insomnia 04/22/2015   Hypercapnic chronic obstructive pulmonary disease 01/16/2015   S/P right total hip arthroplasty 12/03/2013   Hydrocele, right 08/18/2012   Benign essential hypertension 08/18/2012   Smoker 08/18/2012   Annual physical exam 08/18/2012    Immunization History  Administered Date(s) Administered   Fluad Quad(high Dose 65+) 06/19/2019, 07/13/2021   Influenza Split 08/18/2012   Influenza, High Dose Seasonal PF 09/06/2017, 11/03/2018   Influenza-Unspecified 05/24/2014   MMR 07/18/2014   Pneumococcal Polysaccharide-23 11/03/2018   Tdap 08/18/2012    Conditions to be addressed/monitored: HTN, DMII, Anxiety, and Depression  There are no care plans that you recently modified to display for this patient.    Medication Assistance: None required.  Patient affirms current coverage meets needs.  Patient's preferred pharmacy is:  [employer] ([place], [place] [facility] [place] [address] [address] Phone: [phone] Fax: [phone]  [id number]Phone: [phone]: [phone] [id number] [address]:  [phone]: [phone]: [phone]: [phone] pill box? No -   Pt endorses 80% compliance  Follow Up:  Patient agrees to Care Plan and Follow-up.  Plan: Telephone follow up appointment with care management team member scheduled for:  1 month  KeLarinda ButteryPharmD Clinical Pharmacist CoOur [phone]

## 2021-08-29 DIAGNOSIS — F32A Depression, unspecified: Secondary | ICD-10-CM

## 2021-08-29 DIAGNOSIS — J449 Chronic obstructive pulmonary disease, unspecified: Secondary | ICD-10-CM

## 2021-08-29 DIAGNOSIS — E1165 Type 2 diabetes mellitus with hyperglycemia: Secondary | ICD-10-CM

## 2021-08-29 DIAGNOSIS — F419 Anxiety disorder, unspecified: Secondary | ICD-10-CM

## 2021-08-29 DIAGNOSIS — I10 Essential (primary) hypertension: Secondary | ICD-10-CM

## 2021-09-01 ENCOUNTER — Ambulatory Visit: Payer: Medicare HMO | Admitting: Sports Medicine

## 2021-09-07 ENCOUNTER — Other Ambulatory Visit: Payer: Self-pay | Admitting: Sports Medicine

## 2021-09-07 ENCOUNTER — Other Ambulatory Visit: Payer: Self-pay

## 2021-09-07 ENCOUNTER — Ambulatory Visit (INDEPENDENT_AMBULATORY_CARE_PROVIDER_SITE_OTHER): Payer: Medicare HMO | Admitting: Pharmacist

## 2021-09-07 DIAGNOSIS — J449 Chronic obstructive pulmonary disease, unspecified: Secondary | ICD-10-CM

## 2021-09-07 DIAGNOSIS — I10 Essential (primary) hypertension: Secondary | ICD-10-CM

## 2021-09-07 DIAGNOSIS — F32A Depression, unspecified: Secondary | ICD-10-CM

## 2021-09-07 DIAGNOSIS — E1165 Type 2 diabetes mellitus with hyperglycemia: Secondary | ICD-10-CM

## 2021-09-07 MED ORDER — LISINOPRIL 20 MG PO TABS: 20.0000 mg | ORAL_TABLET | Freq: Every day | ORAL | 3 refills | Status: DC

## 2021-09-07 NOTE — Progress Notes (Signed)
Chronic Care Management Pharmacy Note  09/07/2021 Name:  Christopher Robertson MRN:  [id number] DOB:  Jun 14, 1951  Summary: addressed DM, HTN, mental health.   - Ran out of lisinopril 52m (from hospital), needs new RX - Has to cancel upcoming PCP appointment, patient is working out issues with transportation  - Patient hasn't yet gotten glucometer from his pharmacy yet  Recommendations/Changes made from today's visit: Doing well, though A1c not yet controlled. If still not at goal at next check may consider rybelsus in future, happy to assist w/cost if needed.   - PCP to please send Lisinopril RX if intent is to continue - Patient to pick up glucometer at pharmacy and monitor glucose  Plan: f/u with pharmacist in 1 month  Subjective: Christopher Kerstetteris an 71y.o. year old male who is a primary patient of Thekkekandam, TGwen Her MD.  The CCM team was consulted for assistance with disease management and care coordination needs.    Engaged with patient by telephone for follow up visit in response to provider referral for pharmacy case management and/or care coordination services.   Consent to Services:  The patient was given information about Chronic Care Management services, agreed to services, and gave verbal consent prior to initiation of services.  Please see initial visit note for detailed documentation.   Patient Care Team: TSilverio Decamp MD as PCP - General (Family Medicine) KDarius Bump RF[facility] LPas Pharmacist (Pharmacist)  Objective:  Lab Results  Component Value Date   CREATININE 0.94 12/24/2020   CREATININE 1.23 02/15/2020   CREATININE 1.15 11/03/2018    Lab Results  Component Value Date   HGBA1C 7.5 (A) 04/20/2021       Component Value Date/Time   CHOL 143 12/24/2020 0000   TRIG 145 12/24/2020 0000   HDL 40 12/24/2020 0000   CHOLHDL 3.6 12/24/2020 0000   VLDL 59 (H) 08/21/2012 0955   LDLCALC 78 12/24/2020 0000    Hepatic Function Latest  Ref Rng & Units 12/24/2020 02/15/2020 11/03/2018  Total Protein 6.1 - 8.1 g/dL 7.2 6.6 7.2  Albumin 3.5 - 5.2 g/dL - - -  AST 10 - 35 U/L 19 11 13   ALT 9 - 46 U/L 21 15 12   Alk Phosphatase 39 - 117 U/L - - -  Total Bilirubin 0.2 - 1.2 mg/dL 0.5 0.4 0.7    Lab Results  Component Value Date/Time   TSH 1.81 12/24/2020 12:00 AM   TSH 2.11 02/15/2020 02:48 PM    CBC Latest Ref Rng & Units 12/24/2020 02/15/2020 11/03/2018  WBC 3.8 - 10.8 Thousand/uL 8.2 9.6 8.6  Hemoglobin 13.2 - 17.1 g/dL 16.9 15.6 17.1  Hematocrit 38.5 - 50.0 % 50.5(H) 47.2 49.3  Platelets 140 - 400 Thousand/uL 342 329 338    No results found for: VD25OH  Clinical ASCVD: Yes  The ASCVD Risk score (Arnett DK, et al., 2019) failed to calculate for the following reasons:   The patient has a prior MI or stroke diagnosis     Social History   Tobacco Use  Smoking Status Every Day   Packs/day: 0.50   Years: 40.00   Pack years: 20.00   Types: Cigarettes  Smokeless Tobacco Former   Types: Chew   BP Readings from Last 3 Encounters:  08/03/21 103/61  07/13/21 (!) 170/102  06/01/21 130/81   Pulse Readings from Last 3 Encounters:  08/03/21 80  07/13/21 99  06/01/21 86   Wt Readings from Last 3 Encounters:  08/03/21 242  lb 0.6 oz (109.8 kg)  07/13/21 236 lb (107 kg)  06/01/21 236 lb 0.6 oz (107.1 kg)    Assessment: Review of patient past medical history, allergies, medications, health status, including review of consultants reports, laboratory and other test data, was performed as part of comprehensive evaluation and provision of chronic care management services.   SDOH:  (Social Determinants of Health) assessments and interventions performed:    CCM Care Plan  No Known Allergies  Medications Reviewed Today     Reviewed by Darius Bump, [facility] (Pharmacist) on 08/14/21 at [phone]  Med List Status: <None>   Medication Order Taking? Sig Documenting Provider Last Dose Status Informant  AMBULATORY NON FORMULARY  MEDICATION [id number] Yes Motorized scooter with tiller-style handlebars. Silverio Decamp, MD Taking Active   amLODipine (NORVASC) 10 MG tablet [id number] Yes TAKE 1 TABLET EVERY DAY Silverio Decamp, MD Taking Active   atorvastatin (LIPITOR) 80 MG tablet [id number]  Take 1 tablet (80 mg total) by mouth daily at 6 PM. Silverio Decamp, MD  Expired 08/10/21 2359   carvedilol (COREG) 6.25 MG tablet [id number]  Take 1 tablet (6.25 mg total) by mouth 2 (two) times daily with a meal. Silverio Decamp, MD  Expired 08/10/21 2359   DULoxetine (CYMBALTA) 60 MG capsule [id number] Yes Take 1 capsule (60 mg total) by mouth daily. Silverio Decamp, MD Taking Active   Empagliflozin-metFORMIN HCl ER (SYNJARDY XR) 25-1000 MG TB24 [id number]  Take 1 tablet by mouth daily. Silverio Decamp, MD  Expired 08/10/21 2359   Fluticasone-Umeclidin-Vilant (TRELEGY ELLIPTA) 200-62.5-25 MCG/ACT AEPB [id number] Yes 1 inhalation daily Silverio Decamp, MD Taking Active   gabapentin (NEURONTIN) 300 MG capsule [id number]  Take 1 capsule (300 mg total) by mouth 3 (three) times daily. Silverio Decamp, MD  Expired 08/10/21 2359   ipratropium-albuterol (DUONEB) 0.5-2.5 (3) MG/3ML SOLN [id number] Yes USE 1 VIAL VIA NEBULIZER  EVERY 6 HOURS AS NEEDED Silverio Decamp, MD Taking Active   lisinopril (ZESTRIL) 20 MG tablet [id number] Yes Take by mouth. [provider] Taking Active             Patient Active Problem List   Diagnosis Date Noted   Limited mobility 04/20/2021   Chronic pain syndrome 04/20/2021   Peripheral arterial disease ([place]) 02/26/2020   Controlled diabetes mellitus type 2 with complications ([place]) 34/28/7681   Skin necrosis ([place]) 02/15/2020   Methamphetamine abuse ([place]) 01/14/2020   Accidental drug overdose 01/14/2020   Hx of CABG 06/19/2019   Callus of foot 02/12/2019   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   Balance disorder 10/10/2017   Vestibular  dysfunction 10/04/2017   Lumbar spinal stenosis 08/02/2017   Anxiety and depression 08/02/2017   Insomnia 04/22/2015   Hypercapnic chronic obstructive pulmonary disease 01/16/2015   S/P right total hip arthroplasty 12/03/2013   Hydrocele, right 08/18/2012   Benign essential hypertension 08/18/2012   Smoker 08/18/2012   Annual physical exam 08/18/2012    Immunization History  Administered Date(s) Administered   Fluad Quad(high Dose 65+) 06/19/2019, 07/13/2021   Influenza Split 08/18/2012   Influenza, High Dose Seasonal PF 09/06/2017, 11/03/2018   Influenza-Unspecified 05/24/2014   MMR 07/18/2014   Pneumococcal Polysaccharide-23 11/03/2018   Tdap 08/18/2012    Conditions to be addressed/monitored: HTN, DMII, Anxiety, and Depression  Care Plan : Medication Management  Updates made by Darius Bump, RPH since 09/07/2021 12:00 AM     Problem: HTN, COPD, DM, Depression      Long-Range Goal:  Disease Progression Prevention   Start Date: 04/13/2021  Recent Progress: On track  Priority: High  Note:   Current Barriers:  Unable to achieve control of chronic conditions   Pharmacist Clinical Goal(s):  Over the next 30 days, patient will adhere to plan to optimize therapeutic regimen for diabetes, HTN, COPD, and depression as evidenced by report of adherence to recommended medication management changes through collaboration with PharmD and provider.   Interventions: 1:1 collaboration with Silverio Decamp, MD regarding development and update of comprehensive plan of care as evidenced by provider attestation and co-signature Inter-disciplinary care team collaboration (see longitudinal plan of care) Comprehensive medication review performed; medication list updated in electronic medical record  Diabetes:  Uncontrolled yet improving; current treatment: Synjardy 25-103m daily; a1c 9.3  Current glucose readings: not currently checking  Denies hypoglycemic/hyperglycemic  symptoms  Current meal patterns: he has a meal delivery service which is low carb and portioned out  Current exercise: limited by pain  Recommended continue current regimen, PCP was planning to repeat a1c January, if still not at goal consider initiation of rybelsus at future visits,  Hypertension:  Controlled; current treatment: amlodipine 140mdaily, carvedilol 6.2549mID;   Previously discussed current home readings: 140-150/90-100 "when stressed, probably better when relaxed"   Denies hypotensive/hypertensive symptoms  Educated on goal BP, proper technique to check BP at home Recommended continue current regimen Chronic Obstructive Pulmonary Disease:  Controlled (per patient); current treatment:albuterol and duonebs, trellegy (via pulmonologist) ;   1-2 exacerbations requiring treatment in the last 6 months   Recommend continue current regimen,  Depression/Anxiety/SI:  Controlled current treatment: cymbalta 79m7mily;   Needs connected to mental health support, ongoing discussions  Recommended patient dial [phone] for immediate mental health needs, continue current regimen  Patient Goals/Self-Care Activities Over the next 30 days, patient will:  take medications as prescribed  Follow Up Plan: Telephone follow up appointment with care management team member scheduled for:   1 month       Medication Assistance: None required.  Patient affirms current coverage meets needs.  Patient's preferred pharmacy is:  [facility] [facility]: [phone]: [phone]/pharmacy #[id number]- [place]e: [phone]/pharmacy #[id number] [address] [address]: [phone] 336-[place]24[address] [address]: [phone] pill box? No -   Pt endorses 80% compliance  Follow Up:  Patient agrees to Care Plan and Follow-up.  Plan: Telephone follow up appointment with care management team member scheduled for:  1 month  KeeshaLarinda ButterymD Clinical Pharmacist [phone]

## 2021-09-07 NOTE — Patient Instructions (Signed)
Visit Information  Thank you for taking time to visit with me today. Please don't hesitate to contact me if I can be of assistance to you before our next scheduled telephone appointment.  Following are the goals we discussed today:   Patient Goals/Self-Care Activities Over the next 30 days, patient will:  take medications as prescribed  Follow Up Plan: Telephone follow up appointment with care management team member scheduled for:  1 month  Please call the care guide team at [phone] if you need to cancel or reschedule your appointment.   If you are experiencing a Mental Health or Behavioral Health Crisis or need someone to talk to, please call the Suicide and Crisis Lifeline: 988 call 1-800-273-TALK (toll free, 24 hour hotline)   Patient verbalizes understanding of instructions provided today and agrees to view in MyChart.   Jemery Stacey J Joel [place]  

## 2021-09-08 ENCOUNTER — Ambulatory Visit: Payer: Medicare HMO | Admitting: Sports Medicine

## 2021-09-29 DIAGNOSIS — I10 Essential (primary) hypertension: Secondary | ICD-10-CM

## 2021-09-29 DIAGNOSIS — F32A Depression, unspecified: Secondary | ICD-10-CM

## 2021-09-29 DIAGNOSIS — E1165 Type 2 diabetes mellitus with hyperglycemia: Secondary | ICD-10-CM

## 2021-09-29 DIAGNOSIS — J449 Chronic obstructive pulmonary disease, unspecified: Secondary | ICD-10-CM

## 2021-09-29 DIAGNOSIS — F419 Anxiety disorder, unspecified: Secondary | ICD-10-CM

## 2021-10-12 ENCOUNTER — Telehealth: Payer: Medicare HMO

## 2021-10-12 NOTE — Progress Notes (Unsigned)
Chronic Care Management Pharmacy Note  10/12/2021 Name:  Christopher Robertson MRN:  [id number] DOB:  May 04, 1951  Summary: addressed DM, HTN, mental health.   - Ran out of lisinopril 68m (from hospital), needs new RX - Has to cancel upcoming PCP appointment, patient is working out issues with transportation  - Patient hasn't yet gotten glucometer from his pharmacy yet  Recommendations/Changes made from today's visit: Doing well, though A1c not yet controlled. If still not at goal at next check may consider rybelsus in future, happy to assist w/cost if needed.   - PCP to please send Lisinopril RX if intent is to continue - Patient to pick up glucometer at pharmacy and monitor glucose  Plan: f/u with pharmacist in 1 month  Subjective: Christopher Caliendois an 71y.o. year old male who is a primary patient of Thekkekandam, TGwen Her MD.  The CCM team was consulted for assistance with disease management and care coordination needs.    Engaged with patient by telephone for follow up visit in response to provider referral for pharmacy case management and/or care coordination services.   Consent to Services:  The patient was given information about Chronic Care Management services, agreed to services, and gave verbal consent prior to initiation of services.  Please see initial visit note for detailed documentation.   Patient Care Team: TSilverio Decamp MD as PCP - General (Family Medicine) KDarius Bump [facility] Pharmac[facility] (Pharmacist)  Objective:  Lab Results  Component Value Date   CREATININE 0.94 12/24/2020   CREATININE 1.23 02/15/2020   CREATININE 1.15 11/03/2018    Lab Results  Component Value Date   HGBA1C 7.5 (A) 04/20/2021       Component Value Date/Time   CHOL 143 12/24/2020 0000   TRIG 145 12/24/2020 0000   HDL 40 12/24/2020 0000   CHOLHDL 3.6 12/24/2020 0000   VLDL 59 (H) 08/21/2012 0955   LDLCALC 78 12/24/2020 0000    Hepatic Function Latest  Ref Rng & Units 12/24/2020 02/15/2020 11/03/2018  Total Protein 6.1 - 8.1 g/dL 7.2 6.6 7.2  Albumin 3.5 - 5.2 g/dL - - -  AST 10 - 35 U/L _0 ALT 9 - 46 U/L _1 Alk Phosphatase 39 - 117 U/L - - -  Total Bilirubin 0.2 - 1.2 mg/dL 0.5 0.4 0.7    Lab Results  Component Value Date/Time   TSH 1.81 12/24/2020 12:00 AM   TSH 2.11 02/15/2020 02:48 PM    CBC Latest Ref Rng & Units 12/24/2020 02/15/2020 11/03/2018  WBC 3.8 - 10.8 Thousand/uL 8.2 9.6 8.6  Hemoglobin 13.2 - 17.1 g/dL 16.9 15.6 17.1  Hematocrit 38.5 - 50.0 % 50.5(H) 47.2 49.3  Platelets 140 - 400 Thousand/uL 342 329 338    No results found for: VD25OH  Clinical ASCVD: Yes  The ASCVD Risk score (Arnett DK, et al., 2019) failed to calculate for the following reasons:   The patient has a prior MI or stroke diagnosis     Social History   Tobacco Use  Smoking Status Every Day   Packs/day: 0.50   Years: 40.00   Pack years: 20.00   Types: Cigarettes  Smokeless Tobacco Former   Types: Chew   BP Readings from Last 3 Encounters:  08/03/21 103/61  07/13/21 (!) 170/102  06/01/21 130/81   Pulse Readings from Last 3 Encounters:  08/03/21 80  07/13/21 99  06/01/21 86   Wt Readings from Last 3 Encounters:  08/03/21 242  lb 0.6 oz (109.8 kg)  07/13/21 236 lb (107 kg)  06/01/21 236 lb 0.6 oz (107.1 kg)    Assessment: Review of patient past medical history, allergies, medications, health status, including review of consultants reports, laboratory and other test data, was performed as part of comprehensive evaluation and provision of chronic care management services.   SDOH:  (Social Determinants of Health) assessments and interventions performed:    CCM Care Plan  No Known Allergies  Medications Reviewed Today     Reviewed by Darius Bump, [facility] (Pharmacist) on 08/14/21 at [phone]  Med List Status: <None>   Medication Order Taking? Sig Documenting Provider Last Dose Status Informant  AMBULATORY NON FORMULARY  MEDICATION [id number] Yes Motorized scooter with tiller-style handlebars. Silverio Decamp, MD Taking Active   amLODipine (NORVASC) 10 MG tablet [id number] Yes TAKE 1 TABLET EVERY DAY Silverio Decamp, MD Taking Active   atorvastatin (LIPITOR) 80 MG tablet [id number]  Take 1 tablet (80 mg total) by mouth daily at 6 PM. Silverio Decamp, MD  Expired 08/10/21 2359   carvedilol (COREG) 6.25 MG tablet [id number]  Take 1 tablet (6.25 mg total) by mouth 2 (two) times daily with a meal. Silverio Decamp, MD  Expired 08/10/21 2359   DULoxetine (CYMBALTA) 60 MG capsule [id number] Yes Take 1 capsule (60 mg total) by mouth daily. Silverio Decamp, MD Taking Active   Empagliflozin-metFORMIN HCl ER (SYNJARDY XR) 25-1000 MG TB24 [id number]  Take 1 tablet by mouth daily. Silverio Decamp, MD  Expired 08/10/21 2359   Fluticasone-Umeclidin-Vilant (TRELEGY ELLIPTA) 200-62.5-25 MCG/ACT AEPB [id number] Yes 1 inhalation daily Silverio Decamp, MD Taking Active   gabapentin (NEURONTIN) 300 MG capsule [id number]  Take 1 capsule (300 mg total) by mouth 3 (three) times daily. Silverio Decamp, MD  Expired 08/10/21 2359   ipratropium-albuterol (DUONEB) 0.5-2.5 (3) MG/3ML SOLN [id number] Yes USE 1 VIAL VIA NEBULIZER  EVERY 6 HOURS AS NEEDED Silverio Decamp, MD Taking Active   lisinopril (ZESTRIL) 20 MG tablet [id number] Yes Take by mouth. [provider] Taking Active             Patient Active Problem List   Diagnosis Date Noted   Limited mobility 04/20/2021   Chronic pain syndrome 04/20/2021   Peripheral arterial disease ([place]) 02/26/2020   Controlled diabetes mellitus type 2 with complications ([place]) 05/22/3006   Skin necrosis ([place]) 02/15/2020   Methamphetamine abuse ([place]) 01/14/2020   Accidental drug overdose 01/14/2020   Hx of CABG 06/19/2019   Callus of foot 02/12/2019   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   Balance disorder 10/10/2017   Vestibular  dysfunction 10/04/2017   Lumbar spinal stenosis 08/02/2017   Anxiety and depression 08/02/2017   Insomnia 04/22/2015   Hypercapnic chronic obstructive pulmonary disease 01/16/2015   S/P right total hip arthroplasty 12/03/2013   Hydrocele, right 08/18/2012   Benign essential hypertension 08/18/2012   Smoker 08/18/2012   Annual physical exam 08/18/2012    Immunization History  Administered Date(s) Administered   Fluad Quad(high Dose 65+) 06/19/2019, 07/13/2021   Influenza Split 08/18/2012   Influenza, High Dose Seasonal PF 09/06/2017, 11/03/2018   Influenza-Unspecified 05/24/2014   MMR 07/18/2014   Pneumococcal Polysaccharide-23 11/03/2018   Tdap 08/18/2012    Conditions to be addressed/monitored: HTN, DMII, Anxiety, and Depression  There are no care plans that you recently modified to display for this patient.      Medication Assistance: None required.  Patient affirms current coverage meets needs.  Patient's preferred  pharmacy is:  [employer] ([place], [place] [employer] [address] [place] [address] Phone: [phone] Fax: [phone]  [id number] [address]Phone: [phone]: [phone] [id number] [address]: [phone]: [phone]: [phone]: [phone] Uses pill box? No -   Pt endorses 80% compliance  Follow Up:  Patient agrees to Care Plan and Follow-up.  Plan: Telephone follow up appointment with care management team member scheduled for:  1 month  KeLarinda ButteryPharmD Clinical Pharmacist [phone]

## 2021-12-07 DIAGNOSIS — E785 Hyperlipidemia, unspecified: Secondary | ICD-10-CM | POA: Diagnosis not present

## 2021-12-07 DIAGNOSIS — E1151 Type 2 diabetes mellitus with diabetic peripheral angiopathy without gangrene: Secondary | ICD-10-CM | POA: Diagnosis not present

## 2021-12-07 DIAGNOSIS — F325 Major depressive disorder, single episode, in full remission: Secondary | ICD-10-CM | POA: Diagnosis not present

## 2021-12-07 DIAGNOSIS — Z7409 Other reduced mobility: Secondary | ICD-10-CM | POA: Diagnosis not present

## 2021-12-07 DIAGNOSIS — F419 Anxiety disorder, unspecified: Secondary | ICD-10-CM | POA: Diagnosis not present

## 2021-12-07 DIAGNOSIS — M1990 Unspecified osteoarthritis, unspecified site: Secondary | ICD-10-CM | POA: Diagnosis not present

## 2021-12-07 DIAGNOSIS — G8929 Other chronic pain: Secondary | ICD-10-CM | POA: Diagnosis not present

## 2021-12-07 DIAGNOSIS — E11620 Type 2 diabetes mellitus with diabetic dermatitis: Secondary | ICD-10-CM | POA: Diagnosis not present

## 2021-12-07 DIAGNOSIS — F17210 Nicotine dependence, cigarettes, uncomplicated: Secondary | ICD-10-CM | POA: Diagnosis not present

## 2021-12-07 DIAGNOSIS — I10 Essential (primary) hypertension: Secondary | ICD-10-CM | POA: Diagnosis not present

## 2021-12-07 DIAGNOSIS — Z6830 Body mass index (BMI) 30.0-30.9, adult: Secondary | ICD-10-CM | POA: Diagnosis not present

## 2021-12-07 DIAGNOSIS — G4733 Obstructive sleep apnea (adult) (pediatric): Secondary | ICD-10-CM | POA: Diagnosis not present

## 2021-12-07 DIAGNOSIS — J439 Emphysema, unspecified: Secondary | ICD-10-CM | POA: Diagnosis not present

## 2021-12-07 DIAGNOSIS — E1142 Type 2 diabetes mellitus with diabetic polyneuropathy: Secondary | ICD-10-CM | POA: Diagnosis not present

## 2021-12-07 DIAGNOSIS — E1159 Type 2 diabetes mellitus with other circulatory complications: Secondary | ICD-10-CM | POA: Diagnosis not present

## 2021-12-07 DIAGNOSIS — J309 Allergic rhinitis, unspecified: Secondary | ICD-10-CM | POA: Diagnosis not present

## 2021-12-07 DIAGNOSIS — Z791 Long term (current) use of non-steroidal anti-inflammatories (NSAID): Secondary | ICD-10-CM | POA: Diagnosis not present

## 2021-12-07 DIAGNOSIS — Z96649 Presence of unspecified artificial hip joint: Secondary | ICD-10-CM | POA: Diagnosis not present

## 2021-12-07 DIAGNOSIS — E1165 Type 2 diabetes mellitus with hyperglycemia: Secondary | ICD-10-CM | POA: Diagnosis not present

## 2021-12-19 ENCOUNTER — Other Ambulatory Visit: Payer: Self-pay | Admitting: Sports Medicine

## 2022-01-18 ENCOUNTER — Encounter: Payer: Self-pay | Admitting: Sports Medicine

## 2022-01-18 ENCOUNTER — Ambulatory Visit (INDEPENDENT_AMBULATORY_CARE_PROVIDER_SITE_OTHER): Payer: Medicare HMO | Admitting: Sports Medicine

## 2022-01-18 VITALS — BP 152/74 | HR 111 | Ht 72.0 in | Wt 239.0 lb

## 2022-01-18 DIAGNOSIS — E118 Type 2 diabetes mellitus with unspecified complications: Secondary | ICD-10-CM

## 2022-01-18 DIAGNOSIS — J449 Chronic obstructive pulmonary disease, unspecified: Secondary | ICD-10-CM

## 2022-01-18 DIAGNOSIS — Z0000 Encounter for general adult medical examination without abnormal findings: Secondary | ICD-10-CM | POA: Diagnosis not present

## 2022-01-18 DIAGNOSIS — G4733 Obstructive sleep apnea (adult) (pediatric): Secondary | ICD-10-CM

## 2022-01-18 DIAGNOSIS — R195 Other fecal abnormalities: Secondary | ICD-10-CM | POA: Diagnosis not present

## 2022-01-18 DIAGNOSIS — N139 Obstructive and reflux uropathy, unspecified: Secondary | ICD-10-CM

## 2022-01-18 DIAGNOSIS — J441 Chronic obstructive pulmonary disease with (acute) exacerbation: Secondary | ICD-10-CM | POA: Diagnosis not present

## 2022-01-18 MED ORDER — BLOOD GLUCOSE MONITOR KIT: PACK | 0 refills | Status: DC

## 2022-01-18 MED ORDER — TRELEGY ELLIPTA 200-62.5-25 MCG/ACT IN AEPB: INHALATION_SPRAY | RESPIRATORY_TRACT | 11 refills | Status: DC

## 2022-01-18 MED ORDER — AZITHROMYCIN 250 MG PO TABS: ORAL_TABLET | ORAL | 0 refills | Status: AC

## 2022-01-18 MED ORDER — PREDNISONE 50 MG PO TABS: 50.0000 mg | ORAL_TABLET | Freq: Every day | ORAL | 0 refills | Status: AC

## 2022-01-18 MED ORDER — LISINOPRIL 20 MG PO TABS: 20.0000 mg | ORAL_TABLET | Freq: Every day | ORAL | 3 refills | Status: DC

## 2022-01-18 NOTE — Assessment & Plan Note (Addendum)
[place] returns, annual physical as above. Checking labs. Adding Prevnar 20.  Update: Patient left before getting labs.

## 2022-01-18 NOTE — Progress Notes (Signed)
Subjective:    CC: Annual Physical Exam  HPI:  This patient is here for their annual physical  I reviewed the past medical history, family history, social history, surgical history, and allergies today and no changes were needed.  Please see the problem list section below in epic for further details.  Past Medical History: Past Medical History:  Diagnosis Date   COPD (chronic obstructive pulmonary disease) ([place])    History of concussion    yrs ago-- no residual   Hypertension    OA (osteoarthritis)    Right hydrocele    Past Surgical History: Past Surgical History:  Procedure Laterality Date   FEMUR FRACTURE SURGERY  1985   HYDROCELE EXCISION Right 05/23/2015   Procedure: RIGHT HYDROCELECTOMY ADULT;  Surgeon: Kathie Rhodes, MD;  [facility];  Service: Urology;  Laterality: Right;   KNEE SURGERY Bilateral x5 last one 2003   TOTAL HIP ARTHROPLASTY Right 02-28-2015   Social History: Social History   Socioeconomic History   Marital status: Married    Spouse name: Not on file   Number of children: Not on file   Years of education: Not on file   Highest education level: Not on file  Occupational History   Not on file  Tobacco Use   Smoking status: Every Day    Packs/day: 0.50    Years: 40.00    Pack years: 20.00    Types: Cigarettes   Smokeless tobacco: Former    Types: Nurse, children's Use: Never used  Substance and Sexual Activity   Alcohol use: No   Drug use: No   Sexual activity: Not on file  Other Topics Concern   Not on file  Social History Narrative   Not on file   Social Determinants of Health   Financial Resource Strain: Not on file  Food Insecurity: Not on file  Transportation Needs: Not on file  Physical Activity: Not on file  Stress: Not on file  Social Connections: Not on file   Family History: Family History  Problem Relation Age of Onset   Dementia Mother    Colon cancer Father    Cancer Paternal  Grandmother    Allergies: No Known Allergies Medications: See med rec.  Review of Systems: No headache, visual changes, nausea, vomiting, diarrhea, constipation, dizziness, abdominal pain, skin rash, fevers, chills, night sweats, swollen lymph nodes, weight loss, chest pain, body aches, joint swelling, muscle aches, shortness of breath, mood changes, visual or auditory hallucinations.  Objective:    General: Well Developed, well nourished, and in no acute distress.  Neuro: Alert and oriented x3, extra-ocular muscles intact, sensation grossly intact. Cranial nerves II through XII are intact, motor, sensory, and coordinative functions are all intact. HEENT: Normocephalic, atraumatic, pupils equal round reactive to light, neck supple, no masses, no lymphadenopathy, thyroid nonpalpable. Oropharynx, nasopharynx, external ear canals are unremarkable. Skin: Warm and dry, no rashes noted.  Cardiac: Regular rate and rhythm, no murmurs rubs or gallops.  Respiratory: Coarse sounds throughout`. Not using accessory muscles, speaking in full sentences.  Abdominal: Soft, nontender, nondistended, positive bowel sounds, no masses, no organomegaly.  Musculoskeletal: Shoulder, elbow, wrist, hip, knee, ankle stable, and with full range of motion.  Impression and Recommendations:    The patient was counselled, risk factors were discussed, anticipatory guidance given.  Annual physical exam Yeab returns, annual physical as above. Checking labs. Adding Prevnar 20.  Update: Patient left before getting labs.  Controlled diabetes mellitus  type 2 with complications (HCC) Last A1c was 7.8%, he does better from acute on chronic lung disease standpoint when his A1c is better controlled. Rechecking labs today. Does need new glucometer.  Hypercapnic chronic obstructive pulmonary disease Hypercapnic COPD, obstructive sleep apnea currently on BiPAP. He does get winded easily, and is having a mild exacerbation  today. In addition he ran out of Trelegy. We will give him a Trelegy sample, and we will do steroids and antibiotics, chest x-ray today. His O2 sat does drop down to 86% with ambulation, I do think we should recheck this after he gets over his exacerbation but if it persists he will need ambulatory O2.  Positive fecal occult blood test Historically has had a positive fecal occult blood test, we are going to refer him for colonoscopy.   ___________________________________________ Gwen Her. Dianah Field, M.D., ABFM., CAQSM. Primary Care and Sports Medicine [facility]  Adjunct Professor of [place] of [facility] of Medicine

## 2022-01-18 NOTE — Assessment & Plan Note (Signed)
Historically has had a positive fecal occult blood test, we are going to refer him for colonoscopy.

## 2022-01-18 NOTE — Assessment & Plan Note (Addendum)
Last A1c was 7.8%, he does better from acute on chronic lung disease standpoint when his A1c is better controlled. Rechecking labs today. Does need new glucometer.

## 2022-01-18 NOTE — Assessment & Plan Note (Signed)
Hypercapnic COPD, obstructive sleep apnea currently on BiPAP. He does get winded easily, and is having a mild exacerbation today. In addition he ran out of Trelegy. We will give him a Trelegy sample, and we will do steroids and antibiotics, chest x-ray today. His O2 sat does drop down to 86% with ambulation, I do think we should recheck this after he gets over his exacerbation but if it persists he will need ambulatory O2.

## 2022-01-20 ENCOUNTER — Other Ambulatory Visit: Payer: Self-pay

## 2022-01-20 DIAGNOSIS — J449 Chronic obstructive pulmonary disease, unspecified: Secondary | ICD-10-CM

## 2022-01-20 MED ORDER — LISINOPRIL 20 MG PO TABS: 20.0000 mg | ORAL_TABLET | Freq: Every day | ORAL | 3 refills | Status: AC

## 2022-01-20 MED ORDER — ALBUTEROL SULFATE HFA 108 (90 BASE) MCG/ACT IN AERS: 2.0000 | INHALATION_SPRAY | Freq: Four times a day (QID) | RESPIRATORY_TRACT | 3 refills | Status: AC | PRN

## 2022-01-20 MED ORDER — TRELEGY ELLIPTA 200-62.5-25 MCG/ACT IN AEPB: INHALATION_SPRAY | RESPIRATORY_TRACT | 11 refills | Status: DC

## 2022-02-04 ENCOUNTER — Telehealth: Payer: Self-pay

## 2022-02-04 NOTE — Telephone Encounter (Signed)
Received Epic notification that pt has not read MyChart message regarding results.    Labs  From  Silverio Decamp, MD To  Medicine [facility], Rahman Agosta and Delivered  01/18/2022  3:34 PM     [name], you left before getting your labs done, drop by at any point fasting up by my front desk and our lab tech will draw your blood work.   In addition please monitor your pulse ox and if it continues to be below 88% with walking or at rest please let me know. ___________________________________________ Gwen Her. Dianah Field, M.D., ABFM., CAQSM. Primary Care and Sports Medicine [facility]  Adjunct Professor of [place] of [facility] [employer] User Last Read On  [place]orden [place] Not Read       LVM for pt to return call.  Charyl Bigger, CMA

## 2022-02-05 NOTE — Telephone Encounter (Signed)
Left message for patient to return call.

## 2022-02-08 NOTE — Telephone Encounter (Signed)
LVM for pt to call to discuss.  T. Kelley Knoth, CMA  

## 2022-02-09 NOTE — Telephone Encounter (Signed)
LVM for pt with information included in MyChart message that has been unread.  Tiajuana Amass, CMA

## 2022-03-03 ENCOUNTER — Other Ambulatory Visit: Payer: Self-pay | Admitting: Sports Medicine

## 2022-03-12 ENCOUNTER — Telehealth: Payer: Self-pay

## 2022-03-12 DIAGNOSIS — I739 Peripheral vascular disease, unspecified: Secondary | ICD-10-CM

## 2022-03-12 MED ORDER — CILOSTAZOL 50 MG PO TABS: 50.0000 mg | ORAL_TABLET | Freq: Two times a day (BID) | ORAL | 3 refills | Status: AC

## 2022-03-12 NOTE — Telephone Encounter (Signed)
Received fax refill request for Pletal for patient. This med is no longer in patient's current med list. Should patient still be on this med?

## 2022-03-12 NOTE — Telephone Encounter (Signed)
Yes I think it would be reasonable to continue this, restarting it at 50 mg twice a day.

## 2022-04-21 ENCOUNTER — Other Ambulatory Visit: Payer: Self-pay | Admitting: Sports Medicine

## 2022-04-21 ENCOUNTER — Encounter: Payer: Self-pay | Admitting: General Practice

## 2022-05-02 ENCOUNTER — Other Ambulatory Visit: Payer: Self-pay | Admitting: Sports Medicine

## 2022-05-02 DIAGNOSIS — F32A Depression, unspecified: Secondary | ICD-10-CM

## 2022-05-16 ENCOUNTER — Other Ambulatory Visit: Payer: Self-pay | Admitting: Sports Medicine

## 2022-06-25 ENCOUNTER — Other Ambulatory Visit: Payer: Self-pay | Admitting: Sports Medicine

## 2022-06-25 DIAGNOSIS — J449 Chronic obstructive pulmonary disease, unspecified: Secondary | ICD-10-CM

## 2022-06-30 ENCOUNTER — Other Ambulatory Visit: Payer: Self-pay | Admitting: Sports Medicine

## 2022-06-30 DIAGNOSIS — G4733 Obstructive sleep apnea (adult) (pediatric): Secondary | ICD-10-CM | POA: Diagnosis not present

## 2022-07-16 ENCOUNTER — Other Ambulatory Visit: Payer: Self-pay | Admitting: Sports Medicine

## 2022-07-16 DIAGNOSIS — E119 Type 2 diabetes mellitus without complications: Secondary | ICD-10-CM

## 2022-07-16 DIAGNOSIS — E1165 Type 2 diabetes mellitus with hyperglycemia: Secondary | ICD-10-CM

## 2022-07-16 NOTE — Telephone Encounter (Signed)
Med refill

## 2022-09-10 ENCOUNTER — Other Ambulatory Visit: Payer: Self-pay | Admitting: Sports Medicine

## 2022-09-27 ENCOUNTER — Other Ambulatory Visit: Payer: Self-pay | Admitting: Sports Medicine

## 2022-09-27 DIAGNOSIS — E119 Type 2 diabetes mellitus without complications: Secondary | ICD-10-CM

## 2022-09-27 DIAGNOSIS — E1165 Type 2 diabetes mellitus with hyperglycemia: Secondary | ICD-10-CM

## 2022-10-09 ENCOUNTER — Other Ambulatory Visit: Payer: Self-pay | Admitting: Sports Medicine

## 2022-11-18 ENCOUNTER — Other Ambulatory Visit: Payer: Self-pay | Admitting: Sports Medicine

## 2022-12-02 ENCOUNTER — Other Ambulatory Visit: Payer: Self-pay | Admitting: Sports Medicine

## 2022-12-02 DIAGNOSIS — J449 Chronic obstructive pulmonary disease, unspecified: Secondary | ICD-10-CM

## 2022-12-09 ENCOUNTER — Telehealth: Payer: Self-pay | Admitting: Sports Medicine

## 2022-12-09 NOTE — Telephone Encounter (Signed)
Called patient to schedule Medicare Annual Wellness Visit (AWV). Left message for patient to call back and schedule Medicare Annual Wellness Visit (AWV).  Last date of AWV: Never  Please schedule an appointment at any time with [facility].  If any questions, please contact me at [phone].  Thank you ,  Morgan Jessup Patient Access Advocate II Direct Dial: [phone]  

## 2022-12-22 ENCOUNTER — Other Ambulatory Visit: Payer: Self-pay | Admitting: Sports Medicine

## 2022-12-22 DIAGNOSIS — E118 Type 2 diabetes mellitus with unspecified complications: Secondary | ICD-10-CM

## 2023-01-26 ENCOUNTER — Other Ambulatory Visit: Payer: Self-pay | Admitting: Sports Medicine

## 2023-01-26 DIAGNOSIS — J449 Chronic obstructive pulmonary disease, unspecified: Secondary | ICD-10-CM

## 2023-05-18 ENCOUNTER — Other Ambulatory Visit: Payer: Self-pay | Admitting: Sports Medicine

## 2023-05-18 DIAGNOSIS — F419 Anxiety disorder, unspecified: Secondary | ICD-10-CM

## 2023-07-30 ENCOUNTER — Other Ambulatory Visit: Payer: Self-pay | Admitting: Sports Medicine

## 2023-10-28 DIAGNOSIS — J449 Chronic obstructive pulmonary disease, unspecified: Secondary | ICD-10-CM | POA: Diagnosis not present

## 2023-11-13 DIAGNOSIS — R000 Tachycardia, unspecified: Secondary | ICD-10-CM | POA: Diagnosis not present

## 2023-11-13 DIAGNOSIS — I10 Essential (primary) hypertension: Secondary | ICD-10-CM | POA: Diagnosis not present

## 2023-11-13 DIAGNOSIS — R7989 Other specified abnormal findings of blood chemistry: Secondary | ICD-10-CM | POA: Diagnosis not present

## 2023-11-13 DIAGNOSIS — R918 Other nonspecific abnormal finding of lung field: Secondary | ICD-10-CM | POA: Diagnosis not present

## 2023-11-13 DIAGNOSIS — I2510 Atherosclerotic heart disease of native coronary artery without angina pectoris: Secondary | ICD-10-CM | POA: Diagnosis not present

## 2023-11-13 DIAGNOSIS — R911 Solitary pulmonary nodule: Secondary | ICD-10-CM | POA: Diagnosis not present

## 2023-11-13 DIAGNOSIS — E119 Type 2 diabetes mellitus without complications: Secondary | ICD-10-CM | POA: Diagnosis not present

## 2023-11-13 DIAGNOSIS — R799 Abnormal finding of blood chemistry, unspecified: Secondary | ICD-10-CM | POA: Diagnosis not present

## 2023-11-13 DIAGNOSIS — I4510 Unspecified right bundle-branch block: Secondary | ICD-10-CM | POA: Diagnosis not present

## 2023-11-13 DIAGNOSIS — J441 Chronic obstructive pulmonary disease with (acute) exacerbation: Secondary | ICD-10-CM | POA: Diagnosis not present

## 2023-11-13 DIAGNOSIS — Z79899 Other long term (current) drug therapy: Secondary | ICD-10-CM | POA: Diagnosis not present

## 2023-11-13 DIAGNOSIS — F17210 Nicotine dependence, cigarettes, uncomplicated: Secondary | ICD-10-CM | POA: Diagnosis not present

## 2023-11-13 DIAGNOSIS — Z7984 Long term (current) use of oral hypoglycemic drugs: Secondary | ICD-10-CM | POA: Diagnosis not present

## 2023-11-13 DIAGNOSIS — R0602 Shortness of breath: Secondary | ICD-10-CM | POA: Diagnosis not present

## 2023-11-25 DIAGNOSIS — N201 Calculus of ureter: Secondary | ICD-10-CM | POA: Diagnosis not present

## 2023-11-25 DIAGNOSIS — B9689 Other specified bacterial agents as the cause of diseases classified elsewhere: Secondary | ICD-10-CM | POA: Diagnosis not present

## 2023-11-25 DIAGNOSIS — E119 Type 2 diabetes mellitus without complications: Secondary | ICD-10-CM | POA: Diagnosis not present

## 2023-11-25 DIAGNOSIS — I10 Essential (primary) hypertension: Secondary | ICD-10-CM | POA: Diagnosis not present

## 2023-11-25 DIAGNOSIS — Z466 Encounter for fitting and adjustment of urinary device: Secondary | ICD-10-CM | POA: Diagnosis not present

## 2023-11-25 DIAGNOSIS — M549 Dorsalgia, unspecified: Secondary | ICD-10-CM | POA: Diagnosis not present

## 2023-11-25 DIAGNOSIS — N136 Pyonephrosis: Secondary | ICD-10-CM | POA: Diagnosis not present

## 2023-11-25 DIAGNOSIS — R0902 Hypoxemia: Secondary | ICD-10-CM | POA: Diagnosis not present

## 2023-11-25 DIAGNOSIS — R0602 Shortness of breath: Secondary | ICD-10-CM | POA: Diagnosis not present

## 2023-11-25 DIAGNOSIS — Z8679 Personal history of other diseases of the circulatory system: Secondary | ICD-10-CM | POA: Diagnosis not present

## 2023-11-25 DIAGNOSIS — N10 Acute tubulo-interstitial nephritis: Secondary | ICD-10-CM | POA: Diagnosis not present

## 2023-11-25 DIAGNOSIS — F32A Depression, unspecified: Secondary | ICD-10-CM | POA: Diagnosis not present

## 2023-11-25 DIAGNOSIS — N179 Acute kidney failure, unspecified: Secondary | ICD-10-CM | POA: Diagnosis not present

## 2023-11-25 DIAGNOSIS — E871 Hypo-osmolality and hyponatremia: Secondary | ICD-10-CM | POA: Diagnosis not present

## 2023-11-25 DIAGNOSIS — E1151 Type 2 diabetes mellitus with diabetic peripheral angiopathy without gangrene: Secondary | ICD-10-CM | POA: Diagnosis not present

## 2023-11-25 DIAGNOSIS — K5900 Constipation, unspecified: Secondary | ICD-10-CM | POA: Diagnosis not present

## 2023-11-25 DIAGNOSIS — R000 Tachycardia, unspecified: Secondary | ICD-10-CM | POA: Diagnosis not present

## 2023-11-25 DIAGNOSIS — N132 Hydronephrosis with renal and ureteral calculous obstruction: Secondary | ICD-10-CM | POA: Diagnosis not present

## 2023-11-25 DIAGNOSIS — E669 Obesity, unspecified: Secondary | ICD-10-CM | POA: Diagnosis not present

## 2023-11-25 DIAGNOSIS — A419 Sepsis, unspecified organism: Secondary | ICD-10-CM | POA: Diagnosis not present

## 2023-11-25 DIAGNOSIS — F419 Anxiety disorder, unspecified: Secondary | ICD-10-CM | POA: Diagnosis not present

## 2023-11-25 DIAGNOSIS — J449 Chronic obstructive pulmonary disease, unspecified: Secondary | ICD-10-CM | POA: Diagnosis not present

## 2023-11-25 DIAGNOSIS — I2510 Atherosclerotic heart disease of native coronary artery without angina pectoris: Secondary | ICD-10-CM | POA: Diagnosis not present

## 2023-11-25 DIAGNOSIS — Z8709 Personal history of other diseases of the respiratory system: Secondary | ICD-10-CM | POA: Diagnosis not present

## 2023-11-26 DIAGNOSIS — N10 Acute tubulo-interstitial nephritis: Secondary | ICD-10-CM | POA: Diagnosis not present

## 2023-11-26 DIAGNOSIS — B9689 Other specified bacterial agents as the cause of diseases classified elsewhere: Secondary | ICD-10-CM | POA: Diagnosis not present

## 2023-11-28 ENCOUNTER — Telehealth: Payer: Self-pay | Admitting: Sports Medicine

## 2023-11-28 ENCOUNTER — Telehealth: Payer: Self-pay

## 2023-11-28 DIAGNOSIS — E119 Type 2 diabetes mellitus without complications: Secondary | ICD-10-CM

## 2023-11-28 NOTE — Transitions of Care (Post Inpatient/ED Visit) (Signed)
 11/28/2023  Name: Christopher Robertson MRN: [id number] DOB: 09-29-1950  Today's TOC FU Call Status: Today's TOC FU Call Status:: Successful TOC FU Call Completed TOC FU Call Complete Date: 11/28/23 Patient's Name and Date of Birth confirmed.  Transition Care Management Follow-up Telephone Call Date of Discharge: 11/26/23 Discharge Facility: Other [facility]) Name of Other (Non[facility]) Discharge Facility: [facility] Type of Discharge: Inpatient Admission Primary Inpatient Discharge Diagnosis:: Acute pyelonephritis due to bacteria - Cystoscopy w/urinary stent - passed the stone - stent removed How have you been since you were released from the hospital?: Better Any questions or concerns?: No (patient denies)  Items Reviewed: Did you receive and understand the discharge instructions provided?: Yes Medications obtained,verified, and reconciled?: Yes (Medications Reviewed) Any new allergies since your discharge?: No Dietary orders reviewed?: Yes Type of Diet Ordered:: Consistent Carbohydrate Cardiac Diet Do you have support at home?: Yes People in Home: spouse Name of Support/Comfort Primary Source: Christopher Robertson, wife  Medications Reviewed Today: Medications Reviewed Today     Reviewed by Jessy Oto, RN (Registered Nurse) on 11/28/23 at 1318  Med List Status: <None>   Medication Order Taking? Sig Documenting Provider Last Dose Status Informant  ACCU-CHEK GUIDE test strip [id number] Yes TEST BLOOD SUGAR UP TO FOUR TIMES DAILY AS DIRECTED . Monica Becton, MD Taking Active   Accu-Chek Softclix Lancets lancets [id number] Yes TEST BLOOD SUGAR ONE TO FOUR TIMES DAILY Monica Becton, MD Taking Active   albuterol (VENTOLIN HFA) 108 (90 Base) MCG/ACT inhaler [id number] No Inhale 2 puffs into the lungs every 6 (six) hours as needed for wheezing or shortness of breath.  Patient not taking: Reported on 11/28/2023   Monica Becton,  MD Not Taking Active   Alcohol Swabs [facility] ALCOHOL PREP) 70 % PADS [id number] Yes USE ONE TO FOUR TIMES DAILY FOR TESTING Monica Becton, MD Taking Active   AMBULATORY NON [facility] MEDICATION [id number] No Motorized scooter with tiller-style handlebars.  Patient not taking: Reported on 11/28/2023   Monica Becton, MD Not Taking Active            Med Note [facility], [place] A   Mon Nov 28, 2023  1:12 PM) Patient states he does not have this  AMBULATORY NON FORMULARY MEDICATION [id number] Yes Single glucometer with lancets, test strips.  Plan to check twice daily Monica Becton, MD Taking Active   amLODipine (NORVASC) 10 MG tablet [id number] Yes TAKE 1 TABLET EVERY DAY Monica Becton, MD Taking Active   atorvastatin (LIPITOR) 80 MG tablet [id number] Yes TAKE 1 TABLET EVERY DAY AT 6 PM Monica Becton, MD Taking Active   azithromycin (ZITHROMAX Z-PAK) 250 MG tablet [id number] No Take 2 tablets (500 mg) on  Day 1,  followed by 1 tablet (250 mg) once daily on Days 2 through 5.  Patient not taking: Reported on 11/28/2023   Monica Becton, MD Not Taking Consider Medication Status and Discontinue (Completed Course)            Med Note [facility], [place] A   Mon Nov 28, 2023  1:11 PM) Patient states he does not take this  Blood Glucose Monitoring Suppl (ACCU-CHEK GUIDE) w/Device Andria Rhein [id number] Yes USE AS DIRECTED Monica Becton, MD Taking Active   carvedilol (COREG) 6.25 MG tablet [id number] Yes TAKE 1 TABLET TWICE DAILY WITH MEALS Monica Becton, MD Taking Active   cilostazol (PLETAL) 50 MG tablet [id number] Yes Take 1 tablet (50 mg total) by  mouth 2 (two) times daily. Monica Becton, MD Taking Active   DULoxetine (CYMBALTA) 60 MG capsule [id number] Yes TAKE 1 CAPSULE EVERY DAY Monica Becton, MD Taking Active   Fluticasone-Umeclidin-Vilant [facility] ELLIPTA) 200-62.5-25 MCG/ACT AEPB [id number] Yes INHALE 1 PUFF EVERY DAY Monica Becton, MD Taking Active   gabapentin (NEURONTIN) 300 MG capsule [id number]  Take 1 capsule (300 mg total) by mouth 3 (three) times daily. Monica Becton, MD  Expired 08/10/21 2359   ipratropium-albuterol (DUONEB) 0.5-2.5 (3) MG/3ML SOLN [id number] Yes USE 1 VIAL VIA NEBULIZER  EVERY 6 HOURS AS NEEDED Monica Becton, MD Taking Active   levofloxacin (LEVAQUIN) 750 MG tablet [id number] Yes Take 750 mg by mouth daily. [provider] Taking Active   lisinopril (ZESTRIL) 20 MG tablet [id number] No Take 1 tablet (20 mg total) by mouth daily.  Patient not taking: Reported on 11/28/2023   Monica Becton, MD Not Taking Active            Med Note [facility], [place] A   Mon Nov 28, 2023  1:10 PM) Patient states he is out of the medication - MD notified via message  predniSONE (DELTASONE) 50 MG tablet [id number] No Take 1 tablet (50 mg total) by mouth daily.  Patient not taking: Reported on 11/28/2023   Monica Becton, MD Not Taking Consider Medication Status and Discontinue (No longer needed (for PRN medications))   SYNJARDY XR 25-1000 MG TB24 [id number] Yes TAKE 1 TABLET EVERY DAY (NEED APPT FOR ADDITIONAL REFILLS) Monica Becton, MD Taking Active             Home Care and Equipment/Supplies: Were Home Health Services Ordered?: No Any new equipment or medical supplies ordered?: No  Functional Questionnaire: Do you need assistance with bathing/showering or dressing?: No Do you need assistance with meal preparation?: No Do you need assistance with eating?: No Do you have difficulty maintaining continence: Yes (patient reports some recent urinary drippling status post urinary stent) Do you need assistance with getting out of bed/getting out of a chair/moving?: No Do you have difficulty managing or taking your medications?: Yes (Patient states his wife helps him)  Follow up appointments reviewed: PCP Follow-up appointment confirmed?: Yes Date of PCP  follow-up appointment?: 12/07/23 Follow-up Provider: Monica Becton, MD Specialist Hospital Follow-up appointment confirmed?: NA Do you need transportation to your follow-up appointment?: No (Patient states he has transportation with no issues - "several ways") Do you understand care options if your condition(s) worsen?: Yes-patient verbalized understanding  SDOH Interventions Today    Flowsheet Row Most Recent Value  SDOH Interventions   Food Insecurity Interventions AMB Referral  Housing Interventions AMB Referral  Transportation Interventions AMB Referral  Utilities Interventions Intervention Not Indicated      Interventions Today    Flowsheet Row Most Recent Value  Chronic Disease   Chronic disease during today's visit Diabetes  General Interventions   General Interventions Discussed/Reviewed General Interventions Discussed, Doctor Visits  Doctor Visits Discussed/Reviewed Doctor Visits Discussed  [Assisted in scheduling hospital follow up]  Nutrition Interventions   Nutrition Discussed/Reviewed Nutrition Discussed       TOC Interventions Today    Flowsheet Row Most Recent Value  TOC Interventions   TOC Interventions Discussed/Reviewed TOC Interventions Discussed, Arranged PCP follow up less than 12 days/Care Guide scheduled        Hilbert Odor RN, CCM [facility]  VBCI-Population Health RN Care Manager [phone]

## 2023-11-28 NOTE — Telephone Encounter (Addendum)
-----   Message from Nurse Raquel James sent at 11/28/2023  1:28 PM EDT ----- Regarding: Medications - BP Hi, I'm one of the TOC RN's with [facility]. I spoke with patient today who reports he is out of his Lisinopril 20mg  daily and reports his BP this morning before meds was 190/110. He confirmed he is also taking Amlodipine 10mg  daily, and Carvedilol 6.25mg  twice daily. We scheduled an appointment for him to see you 12/07/23. I'm also referring our SW for SDOH. He said he is currently using [facility]. His wife is getting new batteries for BP cuff to make sure is correct. I told him you may need to see him before prescribing but I told him I'd let you know. Thanks, Hilbert Odor RN, CCM [place]  VBCI-Population Health RN Care Manager [phone]   ______________________________________  Denyse Amass, it has been almost 2 years since I have seen him, we really want to talk to him and get labs including renal function before I go sending off his meds.  Thank you for the SW referral.  Hope he follows through this time. ___________________________________________ Ihor Austin. Benjamin Stain, M.D., ABFM., CAQSM., AME. Primary Care and Sports Medicine [facility] [facility]  Adjunct Professor of Family Medicine  [employer] of Medicine  Restaurant manager, fast food ===View-only below this line===

## 2023-11-29 ENCOUNTER — Telehealth: Payer: Self-pay | Admitting: *Deleted

## 2023-11-29 NOTE — Progress Notes (Signed)
 Complex Care Management Note  Care Guide Note 11/29/2023 Name: Christopher Robertson MRN: [id number] DOB: 10/05/1950  Christopher Robertson is a 73 y.o. year old male who sees Thekkekandam, Ihor Austin, MD for primary care. I reached out to [employer] by phone today to offer complex care management services.  Christopher Robertson was given information about Complex Care Management services today including:   The Complex Care Management services include support from the care team which includes your Nurse Care Manager, Clinical Social Worker, or Pharmacist.  The Complex Care Management team is here to help remove barriers to the health concerns and goals most important to you. Complex Care Management services are voluntary, and the patient may decline or stop services at any time by request to their care team member.   Complex Care Management Consent Status: Patient agreed to services and verbal consent obtained.   Follow up plan:  Telephone appointment with complex care management team member scheduled for:  12/05/2023  Encounter Outcome:  Patient Scheduled  Burman Nieves, CMA [place]  [facility], [facility] Guide Direct Dial: [phone]  Fax: [phone] Website: [email or web address]

## 2023-12-05 ENCOUNTER — Other Ambulatory Visit: Payer: Self-pay | Admitting: Licensed Clinical Social Worker

## 2023-12-05 NOTE — Patient Outreach (Signed)
 Complex Care Management   Visit Note  12/05/2023  Name:  Christopher Robertson MRN: [id number] DOB: [birth year]  Situation: Referral received for Complex Care Management related to  I obtained verbal consent from Patient.  Visit completed with patient  on the phone  Background:   Past Medical History:  Diagnosis Date   COPD (chronic obstructive pulmonary disease) (HCC)    History of concussion    yrs ago-- no residual   Hypertension    OA (osteoarthritis)    Right hydrocele     Assessment: Patient Reported Symptoms:  Cognitive    Neurological      HEENT      Cardiovascular      Respiratory      Endocrine      Gastrointestinal      Genitourinary      Integumentary      Musculoskeletal      Psychosocial       There were no vitals filed for this visit.  Medications Reviewed Today   Medications were not reviewed in this encounter     Recommendation:   PCP Follow-up DME requests:  wheelchair Patient has a follow up with PCP on 12/07/2023 and SW will email resources  for food insecurities and housing   Follow Up Plan:   Telephone follow-up 12/16/2023  Jeanie Cooks, PhD [facility], [facility] Social Worker Direct Dial: [phone]  Fax: [phone]

## 2023-12-05 NOTE — Patient Instructions (Signed)
 Visit Information  Thank you for taking time to visit with me today. Please don't hesitate to contact me if I can be of assistance to you before our next scheduled appointment.  Our next appointment is by telephone on 12/26/2023 at 2:00 pm Please call the care guide team at [phone] if you need to cancel or reschedule your appointment.   Following is a copy of your care plan:   Goals Addressed             This Visit's Progress    BSW VBCI Social Work Care Plan          Please call the Suicide and Crisis Lifeline: 988 go to [facility] Urgent [facility] [address] [place] [phone]) call 911 if you are experiencing a Mental Health or Behavioral Health Crisis or need someone to talk to.  Patient verbalizes understanding of instructions and care plan provided today and agrees to view in MyChart. Active MyChart status and patient understanding of how to access instructions and care plan via MyChart confirmed with patient.     Jeanie Cooks, PhD [facility], [facility] Social Worker Direct Dial: [phone]  Fax: [phone]

## 2023-12-07 ENCOUNTER — Inpatient Hospital Stay: Admitting: Sports Medicine

## 2023-12-26 ENCOUNTER — Other Ambulatory Visit: Payer: Self-pay

## 2023-12-26 NOTE — Patient Instructions (Signed)
 Visit Information  Thank you for taking time to visit with me today. Please don't hesitate to contact me if I can be of assistance to you before our next scheduled appointment.  Your next care management appointment is by telephone on 01/10/24 at 2pm.  Please call the care guide team at [phone] if you need to cancel, schedule, or reschedule an appointment.   Please call 911 if you are experiencing a Mental Health or Behavioral Health Crisis or need someone to talk to.  Dallis Dues, BSW [place] [facility]  [facility], [facility] Social Worker Direct Dial: [phone]  Fax: [phone] Website: [email or web address]

## 2023-12-26 NOTE — Patient Outreach (Signed)
 Complex Care Management   Visit Note  12/26/2023  Name:  Christopher Robertson MRN: [id number] DOB: 1950-10-23  Situation: Referral received for Complex Care Management related to SDOH Barriers:  Food insecurity I obtained verbal consent from Patient.  Visit completed with Patient  on the phone  Background:   Past Medical History:  Diagnosis Date   COPD (chronic obstructive pulmonary disease) (HCC)    History of concussion    yrs ago-- no residual   Hypertension    OA (osteoarthritis)    Right hydrocele     Assessment: T/c patient and he reports he received the food bank list sent by prior BSW, but has not had time to contact resources.  Patient reports no food in 2 days.  Patient reports Holiday representative is about 1.5 miles from his home, but transportation is an issue.  Currently he has a [employer] and will be moving in the morning, but can't use the [employer]haul to go to [employer] as things are not secure in the back cabin.  Patient will have money tomorrow for food.  SW suggested asking his friend for food for tonight when he comes to assist with packing the [employer].  Wife has not returned to DSS to recertify foodstamps but will once they relocate to [facility] tomorrow.  Patient has found a home that is affordable.  Patient is familiar with [employer] transportation services.  SW provided link to complete application online and forward to provider once established.  Visit was ended due to patient feeling light headed.   Recommendation:   Patient reported being light headed during the visit.  SW offered to call EMS, but patient reports his wife is with him and will assist.  Follow Up Plan:   Telephone follow up appointment date/time:  01/10/24 at 2pm  Dallis Dues, BSW [place]  [facility], [facility] Social Worker Direct Dial: [phone]  Fax: [phone] Website: [email or web address]

## 2024-01-07 ENCOUNTER — Other Ambulatory Visit: Payer: Self-pay | Admitting: Sports Medicine

## 2024-01-08 DIAGNOSIS — E119 Type 2 diabetes mellitus without complications: Secondary | ICD-10-CM | POA: Diagnosis not present

## 2024-01-08 DIAGNOSIS — I10 Essential (primary) hypertension: Secondary | ICD-10-CM | POA: Diagnosis not present

## 2024-01-08 DIAGNOSIS — R0789 Other chest pain: Secondary | ICD-10-CM | POA: Diagnosis not present

## 2024-01-08 DIAGNOSIS — R519 Headache, unspecified: Secondary | ICD-10-CM | POA: Diagnosis not present

## 2024-01-08 DIAGNOSIS — J449 Chronic obstructive pulmonary disease, unspecified: Secondary | ICD-10-CM | POA: Diagnosis not present

## 2024-01-08 DIAGNOSIS — R079 Chest pain, unspecified: Secondary | ICD-10-CM | POA: Diagnosis not present

## 2024-01-08 DIAGNOSIS — S0990XA Unspecified injury of head, initial encounter: Secondary | ICD-10-CM | POA: Diagnosis not present

## 2024-01-09 ENCOUNTER — Telehealth: Payer: Self-pay

## 2024-01-09 NOTE — Transitions of Care (Post Inpatient/ED Visit) (Unsigned)
   01/09/2024  Name: Christopher Robertson MRN: [id number] DOB: 12/11/1950  Today's TOC FU Call Status: Today's TOC FU Call Status:: Unsuccessful Call (1st Attempt) Unsuccessful Call (1st Attempt) Date: 01/09/24  Attempted to reach the patient regarding the most recent Inpatient/ED visit.  Follow Up Plan: Additional outreach attempts will be made to reach the patient to complete the Transitions of Care (Post Inpatient/ED visit) call.   Signature Darrall Ellison, LPN [facility] Nurse Health Advisor Direct Dial [phone]

## 2024-01-10 ENCOUNTER — Telehealth: Payer: Self-pay

## 2024-01-10 NOTE — Transitions of Care (Post Inpatient/ED Visit) (Unsigned)
   01/10/2024  Name: Christopher Robertson MRN: [id number] DOB: Sep 07, 1950  Today's TOC FU Call Status: Today's TOC FU Call Status:: Unsuccessful Call (2nd Attempt) Unsuccessful Call (1st Attempt) Date: 01/09/24 Unsuccessful Call (2nd Attempt) Date: 01/10/24  Attempted to reach the patient regarding the most recent Inpatient/ED visit.  Follow Up Plan: Additional outreach attempts will be made to reach the patient to complete the Transitions of Care (Post Inpatient/ED visit) call.   Signature Darrall Ellison, LPN [facility] Nurse Health Advisor Direct Dial [phone]

## 2024-01-11 NOTE — Transitions of Care (Post Inpatient/ED Visit) (Signed)
   01/11/2024  Name: Christopher Robertson MRN: [id number] DOB: November 15, 1950  Today's TOC FU Call Status: Today's TOC FU Call Status:: Unsuccessful Call (3rd Attempt) Unsuccessful Call (1st Attempt) Date: 01/09/24 Unsuccessful Call (2nd Attempt) Date: 01/10/24 Unsuccessful Call (3rd Attempt) Date: 01/11/24  Attempted to reach the patient regarding the most recent Inpatient/ED visit.  Follow Up Plan: No further outreach attempts will be made at this time. We have been unable to contact the patient.  Signature Darrall Ellison, LPN [facility] Nurse Health Advisor Direct Dial [phone]

## 2024-01-12 ENCOUNTER — Other Ambulatory Visit: Payer: Self-pay

## 2024-01-13 ENCOUNTER — Other Ambulatory Visit: Payer: Self-pay

## 2024-01-13 ENCOUNTER — Telehealth: Payer: Self-pay

## 2024-01-13 NOTE — Telephone Encounter (Signed)
 Rec'd a fax from [facility] for a refill for patient. Patient has not been seen in the office since 2023. Left a message for patient that he would need to be seen in the office prior to any refills being sent in for him.

## 2024-01-25 ENCOUNTER — Telehealth: Payer: Self-pay

## 2024-01-25 NOTE — Telephone Encounter (Signed)
-----   Message from Nurse Elinor Guardian S sent at 01/25/2024 11:37 AM EDT -----    ----- Message ----- From: Levada Raymond Sent: 01/10/2024   3:37 PM EDT To: Dino Frank, NT; Rhys Chaco, CMA  Patient no showed for BSW follow up.  Please reschedule.

## 2024-01-25 NOTE — Progress Notes (Signed)
 Complex Care Management Care Guide Note  01/25/2024 Name: Christopher Robertson MRN: [id number] DOB: 02/27/1951  Christopher Robertson is a 73 y.o. year old male who is a primary care patient of Sandy Crumb, Joselyn Nicely, MD and is actively engaged with the care management team. I reached out to [facility] by phone today to assist with re-scheduling  with the BSW.  Follow up plan: Unsuccessful telephone outreach attempt made. A HIPAA compliant phone message was left for the patient providing contact information and requesting a return call.  Creola Doheny [facility] Guide  Direct Dial: [phone]  Fax [phone]

## 2024-01-25 NOTE — Telephone Encounter (Signed)
 Patient no showed for BSW follow up.  Please reschedule. Reschedule follow up with BSW L. Clemons   Barnie Bora  [facility]  [facility], [facility] Guide  Direct Dial: [phone]  Fax [phone]

## 2024-01-31 ENCOUNTER — Telehealth: Payer: Self-pay

## 2024-01-31 NOTE — Progress Notes (Signed)
 Complex Care Management Care Guide Note  01/31/2024 Name: Christopher Robertson MRN: [id number] DOB: 1951-07-30  Christopher Robertson is a 73 y.o. year old male who is a primary care patient of Sandy Crumb, Joselyn Nicely, MD and is actively engaged with the care management team. I reached out to [employer] by phone today to assist with re-scheduling  with the Licensed Clinical Child psychotherapist.  Follow up plan: Telephone appointment with complex care management team member scheduled for:  02-03-24  Creola Doheny [facility], [facility] Guide  Direct Dial: [phone]  Fax [phone]

## 2024-02-03 ENCOUNTER — Other Ambulatory Visit: Payer: Self-pay

## 2024-02-03 NOTE — Patient Outreach (Signed)
 Complex Care Management   Visit Note  02/03/2024  Name:  Christopher Robertson MRN: [id number] DOB: 01-14-1951  Situation: Referral received for Complex Care Management related to SDOH Barriers:  Transportation Housing relocation Food insecurity I obtained verbal consent from Patient.  Visit completed with patient  on the phone  Background:   Past Medical History:  Diagnosis Date   COPD (chronic obstructive pulmonary disease) (HCC)    History of concussion    yrs ago-- no residual   Hypertension    OA (osteoarthritis)    Right hydrocele     Assessment:  Patient reports that he, his wife and ex-wife relocated to [facility].  Patient will continue care with his current provider.  The ex-wife provides transportation and lives in the basement.  They all contribute to food, but patient has not recertified with foodstamps.  Patient plans to recertify soon.  No additional assistance is requested.  SDOH Interventions    Flowsheet Row Patient Outreach from 12/26/2023 in [place] POPULATION HEALTH DEPARTMENT Patient Outreach Telephone from 12/05/2023 in [place] POPULATION HEALTH DEPARTMENT Telephone from 11/28/2023 in [place] POPULATION HEALTH DEPARTMENT Office Visit from 11/20/2019 in [facility] Primary Care & Sports Medicine at [facility] Office Visit from 09/28/2019 in [facility] Primary Care & Sports Medicine at [facility] Office Visit from 08/14/2019 in [facility] Primary Care & Sports Medicine at [facility]  SDOH Interventions        Food Insecurity Interventions Other (Comment)  [Will return to DSS to reinstate foodstamps, received food bank list] Assist with SNAP Application  [SW will email] AMB Referral -- -- --  Housing Interventions -- [employer] Provided  [SW will send affordable housing list] AMB Referral -- -- --  Transportation Interventions -- [employer] Provided  [employer] not have transportation and uses uber] AMB  Referral -- -- --  Utilities Interventions -- Intervention Not Indicated Intervention Not Indicated -- -- --  Depression Interventions/Treatment  -- -- -- Currently on Treatment Currently on Treatment Currently on Treatment         Recommendation:   none  Follow Up Plan:   Patient has met all care management goals. Care Management case will be closed. Patient has been provided contact information should new needs arise.   Dallis Dues, BSW [place]  [facility], [facility] Social Worker Direct Dial: [phone]  Fax: [phone] Website: [email or web address]

## 2024-02-03 NOTE — Patient Instructions (Signed)

## 2024-02-08 ENCOUNTER — Ambulatory Visit: Admitting: Sports Medicine

## 2024-02-09 DIAGNOSIS — R0600 Dyspnea, unspecified: Secondary | ICD-10-CM | POA: Diagnosis not present

## 2024-02-09 DIAGNOSIS — I517 Cardiomegaly: Secondary | ICD-10-CM | POA: Diagnosis not present

## 2024-02-09 DIAGNOSIS — I340 Nonrheumatic mitral (valve) insufficiency: Secondary | ICD-10-CM | POA: Diagnosis not present

## 2024-02-09 DIAGNOSIS — E669 Obesity, unspecified: Secondary | ICD-10-CM | POA: Diagnosis not present

## 2024-02-09 DIAGNOSIS — M7989 Other specified soft tissue disorders: Secondary | ICD-10-CM | POA: Diagnosis not present

## 2024-02-09 DIAGNOSIS — I10 Essential (primary) hypertension: Secondary | ICD-10-CM | POA: Diagnosis not present

## 2024-02-09 DIAGNOSIS — E8770 Fluid overload, unspecified: Secondary | ICD-10-CM | POA: Diagnosis not present

## 2024-02-09 DIAGNOSIS — I255 Ischemic cardiomyopathy: Secondary | ICD-10-CM | POA: Diagnosis not present

## 2024-02-09 DIAGNOSIS — I5082 Biventricular heart failure: Secondary | ICD-10-CM | POA: Diagnosis not present

## 2024-02-09 DIAGNOSIS — I509 Heart failure, unspecified: Secondary | ICD-10-CM | POA: Diagnosis not present

## 2024-02-09 DIAGNOSIS — R338 Other retention of urine: Secondary | ICD-10-CM | POA: Diagnosis not present

## 2024-02-09 DIAGNOSIS — I4892 Unspecified atrial flutter: Secondary | ICD-10-CM | POA: Diagnosis not present

## 2024-02-09 DIAGNOSIS — I130 Hypertensive heart and chronic kidney disease with heart failure and stage 1 through stage 4 chronic kidney disease, or unspecified chronic kidney disease: Secondary | ICD-10-CM | POA: Diagnosis not present

## 2024-02-09 DIAGNOSIS — R062 Wheezing: Secondary | ICD-10-CM | POA: Diagnosis not present

## 2024-02-09 DIAGNOSIS — R000 Tachycardia, unspecified: Secondary | ICD-10-CM | POA: Diagnosis not present

## 2024-02-09 DIAGNOSIS — J9602 Acute respiratory failure with hypercapnia: Secondary | ICD-10-CM | POA: Diagnosis not present

## 2024-02-09 DIAGNOSIS — I5043 Acute on chronic combined systolic (congestive) and diastolic (congestive) heart failure: Secondary | ICD-10-CM | POA: Diagnosis not present

## 2024-02-09 DIAGNOSIS — J9601 Acute respiratory failure with hypoxia: Secondary | ICD-10-CM | POA: Diagnosis not present

## 2024-02-09 DIAGNOSIS — I420 Dilated cardiomyopathy: Secondary | ICD-10-CM | POA: Diagnosis not present

## 2024-02-09 DIAGNOSIS — I2510 Atherosclerotic heart disease of native coronary artery without angina pectoris: Secondary | ICD-10-CM | POA: Diagnosis not present

## 2024-02-09 DIAGNOSIS — I483 Typical atrial flutter: Secondary | ICD-10-CM | POA: Diagnosis not present

## 2024-02-09 DIAGNOSIS — R0602 Shortness of breath: Secondary | ICD-10-CM | POA: Diagnosis not present

## 2024-02-09 DIAGNOSIS — N179 Acute kidney failure, unspecified: Secondary | ICD-10-CM | POA: Diagnosis not present

## 2024-02-09 DIAGNOSIS — E119 Type 2 diabetes mellitus without complications: Secondary | ICD-10-CM | POA: Diagnosis not present

## 2024-02-09 DIAGNOSIS — I77810 Thoracic aortic ectasia: Secondary | ICD-10-CM | POA: Diagnosis not present

## 2024-02-09 DIAGNOSIS — Z1383 Encounter for screening for respiratory disorder NEC: Secondary | ICD-10-CM | POA: Diagnosis not present

## 2024-02-09 DIAGNOSIS — E871 Hypo-osmolality and hyponatremia: Secondary | ICD-10-CM | POA: Diagnosis not present

## 2024-02-09 DIAGNOSIS — J449 Chronic obstructive pulmonary disease, unspecified: Secondary | ICD-10-CM | POA: Diagnosis not present

## 2024-02-15 ENCOUNTER — Ambulatory Visit: Admitting: Sports Medicine

## 2024-02-20 ENCOUNTER — Telehealth: Payer: Self-pay

## 2024-02-20 NOTE — Transitions of Care (Post Inpatient/ED Visit) (Signed)
   02/20/2024  Name: Christopher Robertson MRN: [id number] DOB: June 10, 1951  Today's TOC FU Call Status: Today's TOC FU Call Status:: Unsuccessful Call (1st Attempt) Unsuccessful Call (1st Attempt) Date: 02/20/24  Attempted to reach the patient regarding the most recent Inpatient/ED visit.  Follow Up Plan: Additional outreach attempts will be made to reach the patient to complete the Transitions of Care (Post Inpatient/ED visit) call.   Shona Prow RN, CCM [place]  VBCI-Population Health RN Care Manager [phone]

## 2024-02-21 ENCOUNTER — Telehealth: Payer: Self-pay

## 2024-02-21 DIAGNOSIS — I5043 Acute on chronic combined systolic (congestive) and diastolic (congestive) heart failure: Secondary | ICD-10-CM | POA: Diagnosis not present

## 2024-02-21 DIAGNOSIS — Z1330 Encounter for screening examination for mental health and behavioral disorders, unspecified: Secondary | ICD-10-CM | POA: Diagnosis not present

## 2024-02-21 DIAGNOSIS — J449 Chronic obstructive pulmonary disease, unspecified: Secondary | ICD-10-CM | POA: Diagnosis not present

## 2024-02-21 DIAGNOSIS — I420 Dilated cardiomyopathy: Secondary | ICD-10-CM | POA: Diagnosis not present

## 2024-02-21 NOTE — Transitions of Care (Post Inpatient/ED Visit) (Signed)
   02/21/2024  Name: Christopher Robertson MRN: [id number] DOB: [birth year]  Today's TOC FU Call Status: Today's TOC FU Call Status:: Unsuccessful Call (2nd Attempt) Unsuccessful Call (2nd Attempt) Date: 02/21/24  Attempted to reach the patient regarding the most recent Inpatient/ED visit.  Follow Up Plan: Additional outreach attempts will be made to reach the patient to complete the Transitions of Care (Post Inpatient/ED visit) call.   Shona Prow RN, CCM [place]  VBCI-Population Health RN Care Manager [phone]

## 2024-02-22 ENCOUNTER — Telehealth: Payer: Self-pay

## 2024-02-22 NOTE — Transitions of Care (Post Inpatient/ED Visit) (Signed)
   02/22/2024  Name: Christopher Robertson MRN: [id number] DOB: Sep 26, 1950  Today's TOC FU Call Status: Today's TOC FU Call Status:: Successful TOC FU Call Completed TOC FU Call Complete Date: 02/22/24 (spoke with patient who states he has a nurse in the home at time of call and states he does not feel he needs to talk to a nurse on the phone also - Patient declined [facility] program) Patient's Name and Date of Birth confirmed.   Shona Prow RN, CCM [place]  VBCI-Population Health RN Care Manager [phone]

## 2024-02-29 DIAGNOSIS — N1831 Chronic kidney disease, stage 3a: Secondary | ICD-10-CM | POA: Diagnosis not present

## 2024-02-29 DIAGNOSIS — I739 Peripheral vascular disease, unspecified: Secondary | ICD-10-CM | POA: Diagnosis not present

## 2024-02-29 DIAGNOSIS — E1142 Type 2 diabetes mellitus with diabetic polyneuropathy: Secondary | ICD-10-CM | POA: Diagnosis not present

## 2024-02-29 DIAGNOSIS — E1165 Type 2 diabetes mellitus with hyperglycemia: Secondary | ICD-10-CM | POA: Diagnosis not present

## 2024-02-29 DIAGNOSIS — Z7984 Long term (current) use of oral hypoglycemic drugs: Secondary | ICD-10-CM | POA: Diagnosis not present

## 2024-03-06 DIAGNOSIS — I420 Dilated cardiomyopathy: Secondary | ICD-10-CM | POA: Diagnosis not present

## 2024-03-06 DIAGNOSIS — J449 Chronic obstructive pulmonary disease, unspecified: Secondary | ICD-10-CM | POA: Diagnosis not present

## 2024-03-20 DIAGNOSIS — E1165 Type 2 diabetes mellitus with hyperglycemia: Secondary | ICD-10-CM | POA: Diagnosis not present

## 2024-03-21 DIAGNOSIS — F339 Major depressive disorder, recurrent, unspecified: Secondary | ICD-10-CM | POA: Diagnosis not present

## 2024-03-21 DIAGNOSIS — I4892 Unspecified atrial flutter: Secondary | ICD-10-CM | POA: Diagnosis not present

## 2024-03-21 DIAGNOSIS — M545 Low back pain, unspecified: Secondary | ICD-10-CM | POA: Diagnosis not present

## 2024-03-21 DIAGNOSIS — I739 Peripheral vascular disease, unspecified: Secondary | ICD-10-CM | POA: Diagnosis not present

## 2024-03-21 DIAGNOSIS — E7849 Other hyperlipidemia: Secondary | ICD-10-CM | POA: Diagnosis not present

## 2024-03-21 DIAGNOSIS — I10 Essential (primary) hypertension: Secondary | ICD-10-CM | POA: Diagnosis not present

## 2024-03-21 DIAGNOSIS — E1165 Type 2 diabetes mellitus with hyperglycemia: Secondary | ICD-10-CM | POA: Diagnosis not present

## 2024-03-21 DIAGNOSIS — Z720 Tobacco use: Secondary | ICD-10-CM | POA: Diagnosis not present

## 2024-03-21 DIAGNOSIS — I2510 Atherosclerotic heart disease of native coronary artery without angina pectoris: Secondary | ICD-10-CM | POA: Diagnosis not present

## 2024-03-21 DIAGNOSIS — F17210 Nicotine dependence, cigarettes, uncomplicated: Secondary | ICD-10-CM | POA: Diagnosis not present

## 2024-03-22 DIAGNOSIS — Z79899 Other long term (current) drug therapy: Secondary | ICD-10-CM | POA: Diagnosis not present

## 2024-03-22 DIAGNOSIS — I420 Dilated cardiomyopathy: Secondary | ICD-10-CM | POA: Diagnosis not present

## 2024-03-23 DIAGNOSIS — Z1211 Encounter for screening for malignant neoplasm of colon: Secondary | ICD-10-CM | POA: Diagnosis not present

## 2024-03-23 DIAGNOSIS — E119 Type 2 diabetes mellitus without complications: Secondary | ICD-10-CM | POA: Diagnosis not present

## 2024-03-23 LAB — HM DIABETES EYE EXAM

## 2024-03-24 LAB — LAB REPORT - SCANNED
A1c: 8.7
Albumin, Urine POC: 9.7
Creatinine, POC: 60 mg/dL
EGFR: 50
Microalb Creat Ratio: 162

## 2024-03-28 DIAGNOSIS — E119 Type 2 diabetes mellitus without complications: Secondary | ICD-10-CM | POA: Diagnosis not present

## 2024-03-28 DIAGNOSIS — Z1211 Encounter for screening for malignant neoplasm of colon: Secondary | ICD-10-CM | POA: Diagnosis not present

## 2024-04-10 DIAGNOSIS — M5441 Lumbago with sciatica, right side: Secondary | ICD-10-CM | POA: Diagnosis not present

## 2024-04-10 DIAGNOSIS — M5442 Lumbago with sciatica, left side: Secondary | ICD-10-CM | POA: Diagnosis not present

## 2024-04-10 DIAGNOSIS — M5416 Radiculopathy, lumbar region: Secondary | ICD-10-CM | POA: Diagnosis not present

## 2024-04-10 DIAGNOSIS — G8929 Other chronic pain: Secondary | ICD-10-CM | POA: Diagnosis not present

## 2024-04-10 DIAGNOSIS — M21372 Foot drop, left foot: Secondary | ICD-10-CM | POA: Diagnosis not present

## 2024-04-10 DIAGNOSIS — M48061 Spinal stenosis, lumbar region without neurogenic claudication: Secondary | ICD-10-CM | POA: Diagnosis not present

## 2024-04-10 DIAGNOSIS — I10 Essential (primary) hypertension: Secondary | ICD-10-CM | POA: Diagnosis not present

## 2024-04-12 DIAGNOSIS — Z720 Tobacco use: Secondary | ICD-10-CM | POA: Diagnosis not present

## 2024-04-12 DIAGNOSIS — I420 Dilated cardiomyopathy: Secondary | ICD-10-CM | POA: Diagnosis not present

## 2024-04-12 DIAGNOSIS — I10 Essential (primary) hypertension: Secondary | ICD-10-CM | POA: Diagnosis not present

## 2024-04-26 DIAGNOSIS — E119 Type 2 diabetes mellitus without complications: Secondary | ICD-10-CM | POA: Diagnosis not present

## 2024-04-26 DIAGNOSIS — H2513 Age-related nuclear cataract, bilateral: Secondary | ICD-10-CM | POA: Diagnosis not present

## 2024-04-26 DIAGNOSIS — D3132 Benign neoplasm of left choroid: Secondary | ICD-10-CM | POA: Diagnosis not present

## 2024-05-01 ENCOUNTER — Encounter: Payer: Self-pay | Admitting: Sports Medicine

## 2024-05-08 DIAGNOSIS — I2510 Atherosclerotic heart disease of native coronary artery without angina pectoris: Secondary | ICD-10-CM | POA: Diagnosis not present
